# Patient Record
Sex: Female | Born: 1955 | Race: White | Hispanic: No | Smoking: Current every day smoker
Health system: Southern US, Community
[De-identification: ages and names within clinical notes are randomized; demographics above are authoritative.]

## PROBLEM LIST (undated history)

## (undated) DIAGNOSIS — F10939 Alcohol use, unspecified with withdrawal, unspecified: Secondary | ICD-10-CM

## (undated) DIAGNOSIS — R9431 Abnormal electrocardiogram [ECG] [EKG]: Secondary | ICD-10-CM

## (undated) DIAGNOSIS — K259 Gastric ulcer, unspecified as acute or chronic, without hemorrhage or perforation: Secondary | ICD-10-CM

## (undated) DIAGNOSIS — E079 Disorder of thyroid, unspecified: Secondary | ICD-10-CM

## (undated) DIAGNOSIS — K219 Gastro-esophageal reflux disease without esophagitis: Secondary | ICD-10-CM

## (undated) DIAGNOSIS — B1920 Unspecified viral hepatitis C without hepatic coma: Secondary | ICD-10-CM

## (undated) DIAGNOSIS — F419 Anxiety disorder, unspecified: Secondary | ICD-10-CM

## (undated) DIAGNOSIS — K7460 Unspecified cirrhosis of liver: Secondary | ICD-10-CM

## (undated) DIAGNOSIS — K8020 Calculus of gallbladder without cholecystitis without obstruction: Secondary | ICD-10-CM

## (undated) DIAGNOSIS — D691 Qualitative platelet defects: Secondary | ICD-10-CM

## (undated) DIAGNOSIS — K529 Noninfective gastroenteritis and colitis, unspecified: Secondary | ICD-10-CM

## (undated) DIAGNOSIS — R569 Unspecified convulsions: Secondary | ICD-10-CM

## (undated) HISTORY — DX: Unspecified cirrhosis of liver: K74.60

## (undated) HISTORY — PX: TONSILLECTOMY: SUR1361

## (undated) HISTORY — DX: Calculus of gallbladder without cholecystitis without obstruction: K80.20

## (undated) HISTORY — DX: Noninfective gastroenteritis and colitis, unspecified: K52.9

## (undated) HISTORY — PX: TUBAL LIGATION: SHX77

## (undated) HISTORY — PX: OTHER SURGICAL HISTORY: SHX169

## (undated) HISTORY — DX: Disorder of thyroid, unspecified: E07.9

## (undated) HISTORY — PX: DILATION AND CURETTAGE OF UTERUS: SHX78

## (undated) HISTORY — PX: CERVICAL DISCECTOMY: SHX98

## (undated) HISTORY — PX: SHOULDER ARTHROSCOPY WITH ROTATOR CUFF REPAIR: SHX5685

---

## 1998-07-13 ENCOUNTER — Other Ambulatory Visit: Admission: RE | Admit: 1998-07-13 | Discharge: 1998-07-13 | Payer: Self-pay | Admitting: Obstetrics and Gynecology

## 1998-08-10 ENCOUNTER — Other Ambulatory Visit: Admission: RE | Admit: 1998-08-10 | Discharge: 1998-08-10 | Payer: Self-pay | Admitting: Specialist

## 1998-11-09 ENCOUNTER — Ambulatory Visit (HOSPITAL_COMMUNITY): Admission: RE | Admit: 1998-11-09 | Discharge: 1998-11-09 | Payer: Self-pay | Admitting: Neurosurgery

## 1998-11-09 ENCOUNTER — Encounter: Payer: Self-pay | Admitting: Neurosurgery

## 1999-06-25 ENCOUNTER — Encounter: Admission: RE | Admit: 1999-06-25 | Discharge: 1999-06-25 | Payer: Self-pay | Admitting: *Deleted

## 1999-06-28 ENCOUNTER — Encounter: Admission: RE | Admit: 1999-06-28 | Discharge: 1999-06-28 | Payer: Self-pay | Admitting: *Deleted

## 1999-07-06 ENCOUNTER — Other Ambulatory Visit: Admission: RE | Admit: 1999-07-06 | Discharge: 1999-07-06 | Payer: Self-pay | Admitting: *Deleted

## 1999-11-12 ENCOUNTER — Encounter: Payer: Self-pay | Admitting: Neurosurgery

## 1999-11-12 ENCOUNTER — Ambulatory Visit (HOSPITAL_COMMUNITY): Admission: RE | Admit: 1999-11-12 | Discharge: 1999-11-12 | Payer: Self-pay | Admitting: Neurosurgery

## 2000-05-22 ENCOUNTER — Encounter (HOSPITAL_COMMUNITY): Admission: RE | Admit: 2000-05-22 | Discharge: 2000-08-20 | Payer: Self-pay | Admitting: Gastroenterology

## 2000-08-04 ENCOUNTER — Encounter: Admission: RE | Admit: 2000-08-04 | Discharge: 2000-08-04 | Payer: Self-pay | Admitting: *Deleted

## 2000-08-07 ENCOUNTER — Other Ambulatory Visit: Admission: RE | Admit: 2000-08-07 | Discharge: 2000-08-07 | Payer: Self-pay | Admitting: *Deleted

## 2000-11-10 ENCOUNTER — Ambulatory Visit (HOSPITAL_COMMUNITY): Admission: RE | Admit: 2000-11-10 | Discharge: 2000-11-10 | Payer: Self-pay | Admitting: Neurosurgery

## 2000-11-10 ENCOUNTER — Encounter: Payer: Self-pay | Admitting: Neurosurgery

## 2001-08-18 ENCOUNTER — Encounter: Admission: RE | Admit: 2001-08-18 | Discharge: 2001-08-18 | Payer: Self-pay | Admitting: *Deleted

## 2001-08-20 ENCOUNTER — Other Ambulatory Visit: Admission: RE | Admit: 2001-08-20 | Discharge: 2001-08-20 | Payer: Self-pay | Admitting: *Deleted

## 2001-10-12 ENCOUNTER — Ambulatory Visit (HOSPITAL_COMMUNITY): Admission: RE | Admit: 2001-10-12 | Discharge: 2001-10-12 | Payer: Self-pay | Admitting: *Deleted

## 2001-12-31 ENCOUNTER — Encounter: Admission: RE | Admit: 2001-12-31 | Discharge: 2001-12-31 | Payer: Self-pay | Admitting: Sports Medicine

## 2001-12-31 ENCOUNTER — Encounter: Payer: Self-pay | Admitting: Sports Medicine

## 2002-01-18 ENCOUNTER — Encounter: Payer: Self-pay | Admitting: Sports Medicine

## 2002-01-18 ENCOUNTER — Encounter: Admission: RE | Admit: 2002-01-18 | Discharge: 2002-01-18 | Payer: Self-pay | Admitting: Sports Medicine

## 2002-02-01 ENCOUNTER — Encounter: Payer: Self-pay | Admitting: Sports Medicine

## 2002-02-01 ENCOUNTER — Encounter: Admission: RE | Admit: 2002-02-01 | Discharge: 2002-02-01 | Payer: Self-pay | Admitting: Sports Medicine

## 2002-03-19 ENCOUNTER — Encounter: Payer: Self-pay | Admitting: Gastroenterology

## 2002-03-19 ENCOUNTER — Ambulatory Visit (HOSPITAL_COMMUNITY): Admission: RE | Admit: 2002-03-19 | Discharge: 2002-03-19 | Payer: Self-pay | Admitting: Gastroenterology

## 2002-03-19 ENCOUNTER — Encounter (INDEPENDENT_AMBULATORY_CARE_PROVIDER_SITE_OTHER): Payer: Self-pay | Admitting: Specialist

## 2002-04-30 ENCOUNTER — Encounter: Payer: Self-pay | Admitting: Neurosurgery

## 2002-05-04 ENCOUNTER — Inpatient Hospital Stay (HOSPITAL_COMMUNITY): Admission: RE | Admit: 2002-05-04 | Discharge: 2002-05-06 | Payer: Self-pay | Admitting: Neurosurgery

## 2002-05-04 ENCOUNTER — Encounter: Payer: Self-pay | Admitting: Neurosurgery

## 2002-08-19 ENCOUNTER — Encounter: Admission: RE | Admit: 2002-08-19 | Discharge: 2002-08-19 | Payer: Self-pay | Admitting: *Deleted

## 2002-09-07 ENCOUNTER — Other Ambulatory Visit: Admission: RE | Admit: 2002-09-07 | Discharge: 2002-09-07 | Payer: Self-pay | Admitting: *Deleted

## 2003-09-08 ENCOUNTER — Encounter: Admission: RE | Admit: 2003-09-08 | Discharge: 2003-09-08 | Payer: Self-pay | Admitting: *Deleted

## 2003-09-23 ENCOUNTER — Other Ambulatory Visit: Admission: RE | Admit: 2003-09-23 | Discharge: 2003-09-23 | Payer: Self-pay | Admitting: *Deleted

## 2004-09-25 ENCOUNTER — Encounter: Admission: RE | Admit: 2004-09-25 | Discharge: 2004-09-25 | Payer: Self-pay | Admitting: *Deleted

## 2005-10-09 ENCOUNTER — Encounter: Admission: RE | Admit: 2005-10-09 | Discharge: 2005-10-09 | Payer: Self-pay | Admitting: *Deleted

## 2006-09-26 ENCOUNTER — Emergency Department (HOSPITAL_COMMUNITY): Admission: EM | Admit: 2006-09-26 | Discharge: 2006-09-26 | Payer: Self-pay | Admitting: Emergency Medicine

## 2006-10-16 ENCOUNTER — Inpatient Hospital Stay (HOSPITAL_COMMUNITY): Admission: EM | Admit: 2006-10-16 | Discharge: 2006-10-19 | Payer: Self-pay | Admitting: Emergency Medicine

## 2007-06-01 ENCOUNTER — Emergency Department (HOSPITAL_COMMUNITY): Admission: EM | Admit: 2007-06-01 | Discharge: 2007-06-01 | Payer: Self-pay | Admitting: Emergency Medicine

## 2008-01-21 ENCOUNTER — Ambulatory Visit (HOSPITAL_BASED_OUTPATIENT_CLINIC_OR_DEPARTMENT_OTHER): Admission: RE | Admit: 2008-01-21 | Discharge: 2008-01-21 | Payer: Self-pay | Admitting: Orthopedic Surgery

## 2008-01-23 ENCOUNTER — Observation Stay (HOSPITAL_COMMUNITY): Admission: EM | Admit: 2008-01-23 | Discharge: 2008-01-24 | Payer: Self-pay | Admitting: Orthopedic Surgery

## 2008-08-19 ENCOUNTER — Emergency Department (HOSPITAL_COMMUNITY): Admission: EM | Admit: 2008-08-19 | Discharge: 2008-08-19 | Payer: Self-pay | Admitting: Emergency Medicine

## 2008-12-13 ENCOUNTER — Ambulatory Visit (HOSPITAL_COMMUNITY): Admission: RE | Admit: 2008-12-13 | Discharge: 2008-12-13 | Payer: Self-pay | Admitting: Family Medicine

## 2009-01-26 ENCOUNTER — Emergency Department (HOSPITAL_COMMUNITY): Admission: EM | Admit: 2009-01-26 | Discharge: 2009-01-28 | Payer: Self-pay | Admitting: Emergency Medicine

## 2009-07-25 ENCOUNTER — Emergency Department (HOSPITAL_COMMUNITY): Admission: EM | Admit: 2009-07-25 | Discharge: 2009-07-26 | Payer: Self-pay | Admitting: Emergency Medicine

## 2009-09-26 ENCOUNTER — Emergency Department (HOSPITAL_COMMUNITY): Admission: EM | Admit: 2009-09-26 | Discharge: 2009-09-27 | Payer: Self-pay | Admitting: Emergency Medicine

## 2009-10-13 ENCOUNTER — Ambulatory Visit (HOSPITAL_COMMUNITY): Admission: RE | Admit: 2009-10-13 | Discharge: 2009-10-13 | Payer: Self-pay | Admitting: Orthopedic Surgery

## 2010-06-28 ENCOUNTER — Emergency Department (HOSPITAL_COMMUNITY): Admission: EM | Admit: 2010-06-28 | Discharge: 2010-06-29 | Payer: Self-pay | Admitting: Emergency Medicine

## 2010-11-04 ENCOUNTER — Emergency Department (HOSPITAL_COMMUNITY)
Admission: EM | Admit: 2010-11-04 | Discharge: 2010-11-05 | Disposition: A | Discharge status: Other Acute Inpatient Hospital | Payer: Self-pay | Attending: Emergency Medicine | Admitting: Emergency Medicine

## 2010-11-04 DIAGNOSIS — I10 Essential (primary) hypertension: Secondary | ICD-10-CM | POA: Insufficient documentation

## 2010-11-04 DIAGNOSIS — F3289 Other specified depressive episodes: Secondary | ICD-10-CM | POA: Insufficient documentation

## 2010-11-04 DIAGNOSIS — G40909 Epilepsy, unspecified, not intractable, without status epilepticus: Secondary | ICD-10-CM | POA: Insufficient documentation

## 2010-11-04 DIAGNOSIS — F1010 Alcohol abuse, uncomplicated: Secondary | ICD-10-CM | POA: Insufficient documentation

## 2010-11-04 DIAGNOSIS — Z8619 Personal history of other infectious and parasitic diseases: Secondary | ICD-10-CM | POA: Insufficient documentation

## 2010-11-04 DIAGNOSIS — F329 Major depressive disorder, single episode, unspecified: Secondary | ICD-10-CM | POA: Insufficient documentation

## 2010-11-04 LAB — HEPATIC FUNCTION PANEL
Albumin: 3.5 g/dL (ref 3.5–5.2)
Alkaline Phosphatase: 74 U/L (ref 39–117)
Indirect Bilirubin: 1 mg/dL — ABNORMAL HIGH (ref 0.3–0.9)
Total Protein: 6.5 g/dL (ref 6.0–8.3)

## 2010-11-04 LAB — URINALYSIS, ROUTINE W REFLEX MICROSCOPIC
Glucose, UA: NEGATIVE mg/dL
Hgb urine dipstick: NEGATIVE
Ketones, ur: 40 mg/dL — AB
Protein, ur: NEGATIVE mg/dL
pH: 6 (ref 5.0–8.0)

## 2010-11-04 LAB — URINE MICROSCOPIC-ADD ON

## 2010-11-04 LAB — POCT I-STAT, CHEM 8
Calcium, Ion: 0.99 mmol/L — ABNORMAL LOW (ref 1.12–1.32)
Chloride: 96 mEq/L (ref 96–112)
Glucose, Bld: 105 mg/dL — ABNORMAL HIGH (ref 70–99)
HCT: 49 % — ABNORMAL HIGH (ref 36.0–46.0)
Hemoglobin: 16.7 g/dL — ABNORMAL HIGH (ref 12.0–15.0)
Potassium: 3.9 mEq/L (ref 3.5–5.1)

## 2010-11-04 LAB — RAPID URINE DRUG SCREEN, HOSP PERFORMED
Amphetamines: NOT DETECTED
Opiates: NOT DETECTED
Tetrahydrocannabinol: POSITIVE — AB

## 2010-11-04 LAB — DIFFERENTIAL
Eosinophils Absolute: 0.1 10*3/uL (ref 0.0–0.7)
Eosinophils Relative: 2 % (ref 0–5)
Lymphs Abs: 1.6 10*3/uL (ref 0.7–4.0)
Monocytes Relative: 7 % (ref 3–12)

## 2010-11-04 LAB — CBC
MCH: 32.1 pg (ref 26.0–34.0)
Platelets: 126 10*3/uL — ABNORMAL LOW (ref 150–400)
RBC: 5.01 MIL/uL (ref 3.87–5.11)

## 2010-11-05 ENCOUNTER — Inpatient Hospital Stay (HOSPITAL_COMMUNITY)
Admission: AD | Admit: 2010-11-05 | Discharge: 2010-11-09 | DRG: 897 | Disposition: A | Discharge status: Home / Self Care | Payer: PRIVATE HEALTH INSURANCE | Source: Ambulatory Visit | Attending: Psychiatry | Admitting: Psychiatry

## 2010-11-05 DIAGNOSIS — F1020 Alcohol dependence, uncomplicated: Principal | ICD-10-CM

## 2010-11-05 DIAGNOSIS — Z560 Unemployment, unspecified: Secondary | ICD-10-CM

## 2010-11-05 DIAGNOSIS — R45851 Suicidal ideations: Secondary | ICD-10-CM

## 2010-11-05 DIAGNOSIS — F3289 Other specified depressive episodes: Secondary | ICD-10-CM

## 2010-11-05 DIAGNOSIS — F329 Major depressive disorder, single episode, unspecified: Secondary | ICD-10-CM

## 2010-11-05 DIAGNOSIS — G40909 Epilepsy, unspecified, not intractable, without status epilepticus: Secondary | ICD-10-CM

## 2010-11-05 DIAGNOSIS — K219 Gastro-esophageal reflux disease without esophagitis: Secondary | ICD-10-CM

## 2010-11-06 DIAGNOSIS — F1020 Alcohol dependence, uncomplicated: Secondary | ICD-10-CM

## 2010-11-07 LAB — TSH: TSH: 15.727 u[IU]/mL — ABNORMAL HIGH (ref 0.350–4.500)

## 2010-11-07 LAB — COMPREHENSIVE METABOLIC PANEL
Albumin: 3.9 g/dL (ref 3.5–5.2)
Alkaline Phosphatase: 80 U/L (ref 39–117)
BUN: 8 mg/dL (ref 6–23)
Chloride: 99 mEq/L (ref 96–112)
GFR calc Af Amer: >60 mL/min (ref >60)
Glucose, Bld: 102 mg/dL — ABNORMAL HIGH (ref 70–99)
Potassium: 3.9 mEq/L (ref 3.5–5.1)
Total Bilirubin: 0.9 mg/dL (ref 0.3–1.2)

## 2010-11-07 LAB — VITAMIN B12: Vitamin B-12: 479 pg/mL (ref 211–911)

## 2010-11-14 LAB — URINALYSIS, ROUTINE W REFLEX MICROSCOPIC
Glucose, UA: 100 mg/dL — AB
Ketones, ur: 15 mg/dL — AB
Nitrite: NEGATIVE
pH: 7 (ref 5.0–8.0)

## 2010-11-14 LAB — DIFFERENTIAL
Eosinophils Absolute: 0 10*3/uL (ref 0.0–0.7)
Lymphocytes Relative: 20 % (ref 12–46)
Lymphs Abs: 1 10*3/uL (ref 0.7–4.0)
Monocytes Relative: 4 % (ref 3–12)
Neutro Abs: 3.9 10*3/uL (ref 1.7–7.7)
Neutrophils Relative %: 76 % (ref 43–77)

## 2010-11-14 LAB — LIPASE, BLOOD: Lipase: 27 U/L (ref 11–59)

## 2010-11-14 LAB — URINE MICROSCOPIC-ADD ON

## 2010-11-14 LAB — COMPREHENSIVE METABOLIC PANEL
ALT: 60 U/L — ABNORMAL HIGH (ref 0–35)
BUN: 8 mg/dL (ref 6–23)
CO2: 24 mEq/L (ref 19–32)
Calcium: 9.7 mg/dL (ref 8.4–10.5)
Creatinine, Ser: 0.76 mg/dL (ref 0.4–1.2)
GFR calc non Af Amer: >60 mL/min (ref >60)
Glucose, Bld: 176 mg/dL — ABNORMAL HIGH (ref 70–99)
Total Protein: 8.1 g/dL (ref 6.0–8.3)

## 2010-11-14 LAB — CBC
HCT: 50.8 % — ABNORMAL HIGH (ref 36.0–46.0)
Hemoglobin: 18.3 g/dL — ABNORMAL HIGH (ref 12.0–15.0)
MCH: 32.9 pg (ref 26.0–34.0)
MCHC: 36 g/dL (ref 30.0–36.0)
MCV: 91.2 fL (ref 78.0–100.0)
RDW: 12.5 % (ref 11.5–15.5)

## 2010-11-14 LAB — ETHANOL: Alcohol, Ethyl (B): <5 mg/dL (ref 0–10)

## 2010-11-14 NOTE — H&P (Signed)
  NAMEMarland Kitchen  KEYSI, OELKERS                  ACCOUNT NO.:  [id number]  MEDICAL RECORD NO.:  [id number]           PATIENT TYPE:  I  LOCATION:  0300                          FACILITY:  [facility]  PHYSICIAN:  Anselm Jungling, MD  DATE OF BIRTH:  07/09/1956  DATE OF ADMISSION:  11/05/2010 DATE OF DISCHARGE:  11/09/2010                      PSYCHIATRIC ADMISSION ASSESSMENT   HISTORY OF PRESENT ILLNESS:  This is a 55 year old who came with a long history of alcohol use and having a history of seizures.  She presented to the emergency room with severe depression and alcohol dependence, having suicidal thinking with several plans.  PAST PSYCHIATRIC HISTORY:  First admission to the [facility].  The patient currently on Celexa.  SOCIAL HISTORY:  This is a single, unemployed female who lives in [place].  FAMILY HISTORY:  Family history is unknown.  ALLERGIES:  ADHESIVES, SULFA WITH THE REACTION AS HIVES, OXYCODONE - HIVES.  CODEINE - HIVES.  ALCOHOL/DRUG HISTORY:  Alcohol history:  The patient has been drinking approximately a pint daily for the past 9 months.  PRIMARY CARE PROVIDER:  [facility].  MEDICAL PROBLEMS:  History of seizures and GERD.  PHYSICAL EXAMINATION:  Physical exam was done in [facility] Emergency Department.  This is a middle-aged female in no distress.  LABORATORY DATA:  Alcohol level less than five.  Urine drug screen positive for benzodiazepines.  Urinalysis shows a cloudy appearance. Sodium 132, glucose 105, hemoglobin 16.1, hematocrit 46.7, platelet count 126,000.  Liver enzymes are elevated with SGOT of 99, SGPT of 70, total bilirubin of 1.5, and direct bilirubin of 0.5.  MENTAL STATUS EXAM:  A pleasant and cooperative female in no acute distress, coherent thought processes, feeling depressed, and does not appear to be actively responding.  DIAGNOSES:  Axis I:  Alcohol dependence. Axis II:  Deferred. Axis III:  Seizure  disorder, gastroesophageal reflux disease. Axis IV:  Problems with unemployment, problems with primary support - son. Axis V:  Current is 40.  PLAN:  Our plan is to detoxify the patient with Librium protocol, monitor further labs, and assess motivation for rehab.  We will continue with her Celexa.     Landry Corporal, N.P.   ______________________________ Anselm Jungling, MD    JO/MEDQ  D:  11/13/2010  T:  11/13/2010  Job:  [id number]  Electronically Signed by Limmie PatriciaP. on 11/13/2010 04:12:32 PM Electronically Signed by Geralyn Flash MD on 11/14/2010 11:29:35 AM

## 2010-11-14 NOTE — Discharge Summary (Signed)
NAMEMarland Kitchen  Susan Francis, Susan Francis                  ACCOUNT NO.:  [id number]  MEDICAL RECORD NO.:  [id number]           PATIENT TYPE:  I  LOCATION:  0300                          FACILITY:  [facility]  PHYSICIAN:  Anselm Jungling, MD  DATE OF BIRTH:  12-Mar-1956  DATE OF ADMISSION:  11/05/2010 DATE OF DISCHARGE:                              DISCHARGE SUMMARY   IDENTIFYING DATA/REASON FOR ADMISSION:  This was an inpatient psychiatric admission for Susan Francis, a 55 year old woman who was admitted for treatment of alcohol dependence.  Please refer to the admission note for further details pertaining to the symptoms, circumstances and history that led to her hospitalization.  She was given an initial Axis I diagnosis of alcohol dependence.  MEDICAL AND LABORATORY:  The patient has a history of seizure disorder. She is followed at [facility].  Her normal regimen for this is Keppra XR 750 mg t.i.d.  This was continued during her stay. She was medically and physically assessed by the psychiatric nurse practitioner.  There were no significant medical issues during her stay. At discharge she was referred back to [facility] for follow-up.  HOSPITAL COURSE:  The patient was admitted to the adult inpatient psychiatric service.  She presented as a very pleasant, articulate and friendly woman who readily acknowledged that she needed to stop using alcohol.  She described having become more dependent on alcohol as a means of treating her depression, which she recognized was maladaptive. She had been on a regimen of Celexa 60 mg, but the dose was decreased to 40 mg out of consideration for possibility of QT prolongation.  She was placed on a Librium withdrawal protocol and continued on her usual Keppra 750 mg 3 times daily.  Her detoxification was uneventful. She participated in therapeutic groups and activities geared towards helping her acquire better coping skills, a better  understanding of her underlying disorders and dynamics, and the development of a sound aftercare plan.  She also attended groups geared towards 12-step recovery.  She was an excellent participant in treatment program throughout.  She did not have any suicidal ideation at any time during her stay.  She verbalized understanding of the need to permanently and forever relinquish alcohol.  She was appropriate for discharge on the fifth hospital day.  She had completed her detoxification, without any seizure activity.  She agreed to the following aftercare plan.  AFTERCARE:  The patient was to follow-up at [facility], where, at the time of this dictation, appointments were still yet to be made for a psychiatrist for medication follow-up, and individual counseling.  She was also encouraged to attend 12-step groups in the community.  She was referred back to [facility] for follow-up of her seizure disorder.  DISCHARGE MEDICATIONS: 1. Keppra XR 750 mg b.i.d. 2. Celexa 40 mg daily.  DISCHARGE DIAGNOSES:  AXIS I: Alcohol dependence, early remission and depressive disorder NOS. AXIS II: Deferred. AXIS III: Seizure disorder NOS. AXIS IV: Stressors severe. AXIS V: GAF on discharge 50.     Anselm Jungling, MD     SPB/MEDQ  D:  11/09/2010  T:  11/09/2010  Job:  [id number]  Electronically Signed by Geralyn Flash MD on 11/14/2010 11:29:32 AM

## 2010-11-19 LAB — CBC
HCT: 44.5 % (ref 36.0–46.0)
MCHC: 34.3 g/dL (ref 30.0–36.0)
MCV: 97.1 fL (ref 78.0–100.0)
RBC: 4.58 MIL/uL (ref 3.87–5.11)
WBC: 8.4 10*3/uL (ref 4.0–10.5)

## 2010-11-19 LAB — POCT I-STAT, CHEM 8
Calcium, Ion: 1.04 mmol/L — ABNORMAL LOW (ref 1.12–1.32)
Creatinine, Ser: 0.7 mg/dL (ref 0.4–1.2)
Glucose, Bld: 145 mg/dL — ABNORMAL HIGH (ref 70–99)
Hemoglobin: 16 g/dL — ABNORMAL HIGH (ref 12.0–15.0)
TCO2: 22 mmol/L (ref 0–100)

## 2010-11-19 LAB — DIFFERENTIAL
Basophils Relative: 1 % (ref 0–1)
Eosinophils Absolute: 0 10*3/uL (ref 0.0–0.7)
Eosinophils Relative: 0 % (ref 0–5)
Lymphs Abs: 2 10*3/uL (ref 0.7–4.0)
Monocytes Relative: 3 % (ref 3–12)

## 2010-12-11 LAB — RAPID URINE DRUG SCREEN, HOSP PERFORMED
Amphetamines: NOT DETECTED
Barbiturates: NOT DETECTED
Benzodiazepines: POSITIVE — AB
Cocaine: NOT DETECTED
Opiates: NOT DETECTED

## 2010-12-11 LAB — BASIC METABOLIC PANEL
CO2: 27 mEq/L (ref 19–32)
Calcium: 8.9 mg/dL (ref 8.4–10.5)
Glucose, Bld: 107 mg/dL — ABNORMAL HIGH (ref 70–99)
Sodium: 137 mEq/L (ref 135–145)

## 2010-12-11 LAB — URINALYSIS, ROUTINE W REFLEX MICROSCOPIC
Glucose, UA: NEGATIVE mg/dL
Hgb urine dipstick: NEGATIVE
Ketones, ur: NEGATIVE mg/dL
Protein, ur: NEGATIVE mg/dL

## 2010-12-11 LAB — DIFFERENTIAL
Basophils Absolute: 0 10*3/uL (ref 0.0–0.1)
Basophils Relative: 1 % (ref 0–1)
Eosinophils Relative: 2 % (ref 0–5)
Monocytes Absolute: 0.4 10*3/uL (ref 0.1–1.0)
Neutro Abs: 3.1 10*3/uL (ref 1.7–7.7)

## 2010-12-11 LAB — URINE MICROSCOPIC-ADD ON

## 2010-12-11 LAB — CBC
Hemoglobin: 15.3 g/dL — ABNORMAL HIGH (ref 12.0–15.0)
MCHC: 34.1 g/dL (ref 30.0–36.0)
RDW: 13.5 % (ref 11.5–15.5)

## 2011-01-15 NOTE — Consult Note (Signed)
NAME:  Susan Francis, Susan Francis NO.:  [id number]   MEDICAL RECORD NO.:  [id number]          PATIENT TYPE:  EMS   LOCATION:  ED                           FACILITY:  [facility]   PHYSICIAN:  Antonietta Breach, M.D.  DATE OF BIRTH:  01/28/1956   DATE OF CONSULTATION:  01/27/2009  DATE OF DISCHARGE:                                 CONSULTATION   REFERRING PHYSICIAN:  [facility].   HISTORY OF PRESENT ILLNESS:  Susan Francis is a 55 year old female presenting  to [facility] with a request for detoxification.   She has been drinking a pint of liquor per day for a week-and-a-half.  She takes Xanax 0.5 mg daily.  She does have a primary seizure disorder.   HISTORY:  She received a milligram of Ativan last night with her vital  signs at temperature 90.2, pulse 60, respiratory rate 20, blood pressure  143/86 this morning.  VITAL SIGNS:  Temperature 98.2, pulse 95, respiratory rate 20, blood  pressure 157/75.  Please see the physical examination section.   Her mood state is normal.  She has constructive future goals and  interests.  She does not have any thoughts of harming herself or others.  She has no hallucinations or delusions.  Her orientation and memory  function are intact.  She has an excellent supportive relationship with  her daughter, and her daughter has been providing support today.   She is socially appropriate and cooperative with the emergency room  staff. When she first presented she was having some high jitteriness and  anxiety.  Please see the initial vital signs above.  She, however, is  not showing any excessive worry.   PAST PSYCHIATRIC HISTORY:  She does have a history of depression, and  has been taking her Celexa at home regularly.  She does have a history  of anxiety, and takes Xanax 0.5 mg daily.  Her Celexa dosage is 40 mg  daily.   FAMILY PSYCHIATRIC HISTORY:  None known.   SOCIAL HISTORY:  Please see the substance abuse history above.   She  lives at home.  She is separated.  She attends church.   PAST MEDICAL HISTORY:  1. Gastroesophageal reflux disease.  2. Hepatitis C.  3. Seizure disorder on Keppra.   ALLERGIES:  1. CODEINE.  2. SULFA.   CURRENT MEDICATIONS:  1. Ativan 1 mg q.4 hours p.r.n. withdrawal.  She is receiving vital      signs q.4 hours.  2. Celexa 40 mg daily.  3. Keppra 75 mg b.i.d.   LABORATORY DATA:  Her BMP is normal.  Alcohol level negative.  The urine  drug screen was positive for benzodiazepines and tetrahydrocannabinol.  She has an unremarkable CBC.   Urine pregnancy negative.   REVIEW OF SYSTEMS:  Noncontributory.   PHYSICAL EXAMINATION:  Please see the vital signs above.  She has slight  tremor on hand extension.  There are no sweats.   MENTAL STATUS EXAM:  Eye contact is good.  Her attention span is normal.  Her affect is slightly anxious, mood is slightly  anxious.  She is  oriented to all spheres.  Memory is intact to immediate recent and  remote.  Fund of knowledge and intelligence are normal.  Speech is  normal.  Thought process logical, coherent, goal-directed.  No looseness  of associations.  Thought content:  No thoughts of harming herself or  others, no delusions or hallucinations.  Insight is intact.  Judgment is  intact.   ASSESSMENT:  Axis I:  Rule out a combination of sedative hypnotic  dependence and alcohol dependence with physiologic dependence.  Polysubstance dependence.  Anxiety disorder not otherwise specified,  stable.  Depressive disorder not otherwise specified, stable.  Axis II:  Deferred.  Axis III:  See the history above.  She does have a primary seizure  disorder.  Axis IV:  Primary support group.  Axis V:  55.   Susan Francis is not at risk to harm herself or others.  She agrees to use  emergency services as needed.   The undersigned provided education.   The undersigned discussed the case with the attending emergency room  physician.   The  recommendation at this point is to continue to observe Susan Francis for  any signs of withdrawal, and have a p.r.n. Ativan regimen available.  If  she does present withdrawal, would start the phenobarbital withdrawal  protocol and discontinue Ativan.   Would continue her Celexa 40 mg daily for anti-anxiety, anti-depression.   When she is finished with her evaluation and treatment of potential  withdrawal, she would be a candidate for a residential chemical  dependence rehabilitation program such as [facility], [id number] or [employer].   Twelve-step method and groups.   Thiamine 100 mg daily indefinitely.      Antonietta Breach, M.D.  Electronically Signed     JW/MEDQ  D:  01/27/2009  T:  01/27/2009  Job:  [id number]   cc:   [facility]
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
## 2011-01-18 NOTE — H&P (Signed)
   NAME:  Susan Francis, Susan Francis                            ACCOUNT NO.:  [id number]   MEDICAL RECORD NO.:  [id number]                   PATIENT TYPE:  INP   LOCATION:  3011                                 FACILITY:  [facility]   PHYSICIAN:  Payton Doughty, M.D.                   DATE OF BIRTH:  Sep 19, 1955   DATE OF ADMISSION:  05/04/2002  DATE OF DISCHARGE:                                HISTORY & PHYSICAL   ADMISSION DIAGNOSIS:  Herniated disk, C5-6 and C6-7.   HISTORY OF PRESENT ILLNESS:  This is a 55 year old, right-handed, white lady  known for years.  She has a Rathke's cleft cyst.  She was doing well from  that standpoint and then developed pain in her neck and out into her right  shoulder.  MRI demonstrated herniated disk at C5-6 and degenerative changes  at C6-7.  She underwent epidural steroids to no avail and now has presented  for an anterior cervicectomy and fusion at C5-6 and C6-7.   PAST MEDICAL HISTORY:  The medical history is otherwise unremarkable.   PAST SURGICAL HISTORY:  She has had a tubal ligation in the past.   ALLERGIES:  None.   FAMILY HISTORY:  Unknown.   REVIEW OF SYMPTOMS:  Remarkable for neck and shoulder pain.   PHYSICAL EXAMINATION:  HEENT:  Exam is within normal limits.  NECK:  She has good range of motion.  CHEST:  Clear.  CARDIAC:  Regular rate and rhythm.  ABDOMEN:  Nontender.  No hepatosplenomegaly.  EXTREMITIES:  Without clubbing or cyanosis.  GENITOURINARY:  Exam is deferred.  PERIPHERAL PULSES:  Good.  NEUROLOGIC:  She is awake, alert, and oriented.  Cranial nerves are intact.  The motor exam demonstrates 5/5 strength throughout the upper and lower  extremities, except for the right triceps 4/5 and the biceps 5-/5 on the  right.  Reflexes are diminished in the right biceps and triceps.  Head  motion does not have a Lhermitte's sign, just increased shoulder pain.   LABORATORY DATA:  MRI demonstrated herniated disk at C5-6 and C6-7.   PLAN:  Anterior  cervicectomy and fusion at C5-6 and C6-7.  The risks and  benefits of this approach have been discussed with her and she wishes to  proceed.                                               Payton Doughty, M.D.    MWR/MEDQ  D:  05/04/2002  T:  05/04/2002  Job:  [id number]

## 2011-01-18 NOTE — Procedures (Signed)
EEG NUMBER:  [id number].   CLINICAL HISTORY:  The patient is a 55 year old who has had a series of  3 generalized seizures.  Study is being done to look for the presence of  an ictal focus (Code 780.39).   PROCEDURE:  The tracing is carried out on a 32-digital Cadwell recorder  reformatted into 16-channel montages with one devoted to EKG.  The  patient was awake and drowsy during the recording.  The [employer]  10/20 system of lead placement was used.   MEDICATIONS INCLUDE:  Ativan, morphine sulfate, Protonix, Dilantin,  Cerebyx, Tylenol, and Phenergan.   DESCRIPTION OF FINDINGS:  Dominant frequency is a 20-40 microvolt 11-12  Hz activity that is broadly distributed.  Frontally predominant, under  10 microvolt, beta range activity was seen.  There was no full focal  slowing.  There was no interictal epileptiform activity in the form of  spikes or sharp waves.  EKG showed a regular sinus rhythm with  ventricular response of 7-8 beats per minute.   IMPRESSION:  Normal waking record.      Deanna Artis. Sharene Skeans, M.D.  Electronically Signed     ZOX:WRUE  D:  10/17/2006 23:26:48  T:  10/18/2006 08:04:54  Job #:  [id number]
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
## 2011-01-18 NOTE — Op Note (Signed)
NAME:  Susan Francis, Susan Francis                            ACCOUNT NO.:  [id number]   MEDICAL RECORD NO.:  [id number]                   PATIENT TYPE:  INP   LOCATION:  3011                                 FACILITY:  [facility]   PHYSICIAN:  Payton Doughty, M.D.                   DATE OF BIRTH:  18-Jan-1956   DATE OF PROCEDURE:  05/04/2002  DATE OF DISCHARGE:                                 OPERATIVE REPORT   PREOPERATIVE DIAGNOSES:  Herniated disk at C5-6, cervical spondylosis at C6-  7.   POSTOPERATIVE DIAGNOSES:  Herniated disk at C5-6, cervical spondylosis at C6-  7.   OPERATION PERFORMED:  C5-6, C6-7 anterior cervical diskectomy with tether  plate.   SURGEON:  Payton Doughty, M.D.   ANESTHESIA:  General endotracheal.   PREP:  Sterile Betadine prep and scrub with alcohol wipe.   ASSISTANT:  1. Clydene Fake, M.D.  2. McCall.   INDICATIONS FOR PROCEDURE:  The patient is a 55 year old right-handed white  female with a right C6 and 7 radiculopathy.   DESCRIPTION OF PROCEDURE:  The patient was taken to the operating room,  smoothly anesthetized and placed  supine on the operating table.  Following  shave, prep and drape, in the usual sterile fashion, the skin was incised  from the midline to the border of the sternocleidomastoid muscle on the left  side two fingerbreadths below the level of the carotid tubercle.  The  platysma was identified, elevated, divided and undermined.  The  sternocleidomastoid was identified.  Medial dissection revealed the carotid  artery retracted laterally to the left.  Trachea and esophagus were  retracted laterally to the right exposing the bones of the anterior cervical  spine.  A marker was placed and intraoperative x-ray obtained to confirm  correctness of the level.  Having confirmed correctness of the level, the  longus colli was taken down bilaterally and held out of place with a Shadow  line self-retaining retractor.  Intraoperative x-ray confirmed  the  correctness of the level.  Diskectomy was carried out at the C5-6 and C6-7  first under gross observation.  The operating microscope was then brought in  and we used microdissection technique to dissect the anterior epidural  space, remove disk and bone spur and liberate the 6 and 7 roots bilaterally.  At 5-6 there was a fairly large herniated disk extending toward the right  neural foramen.  This was removed without difficulty.  At 6-7 there was  mostly spondylitic disease.  Following complete decompression of the nerve  roots, the wound was irrigated and hemostasis assured.  7 mm bone graft was  fashioned with patellar allograft and tapped into place.  A 39 mm tether  plate was then placed with 12 mm screws, two in C5, one in C6 and two in C7.  No second intraoperative x-ray was obtained as the  patient's body habitus  prevented seeing any of the construct.  The wound was irrigated.  A 7 mm  Hemovac drain was placed in the posterior cervical space.  It was exited by  separate incision.  The platysma was reapproximated with 3-0 Vicryl in  interrupted fashion.  Subcutaneous tissues were reapproximated with 3-0  Vicryl in interrupted fashion.  The skin was closed with 4-0 Vicryl in  running subcuticular fashion.  Benzoin and Steri-Strips were placed and made  occlusive with Telfa and OpSite and the patient returned to the recovery  room in good condition.                                                 Payton Doughty, M.D.    MWR/MEDQ  D:  05/04/2002  T:  05/04/2002  Job:  [id number]
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
## 2011-01-18 NOTE — Discharge Summary (Signed)
NAMEMarland Kitchen  Susan Francis, Susan Francis                  ACCOUNT NO.:  [id number]   MEDICAL RECORD NO.:  [id number]          PATIENT TYPE:  INP   LOCATION:  2623                         [facility]ACILITY[facility]  [facility]   PHYSICIAN:  Barnetta Chapel, MDDATE OF BIRTH:  [birth year]   DATE OF ADMISSION:  10/16/2006  DATE OF DISCHARGE:  10/19/2006                               DISCHARGE SUMMARY   ADDENDUM:  The patient is going to be discharged back home today, October 19, 2006.  On the day of discharge, the patient reported redness around the  left elbow (around the IV site).  The patient's daughter is a bit  worried that may be associated with cellulitis.  I have prescribed  Levaquin 750 mg p.o. once daily for 5 days.   ADDENDUM TO DISCHARGE DIAGNOSIS:  Please add to the discharge diagnoses:  Suspect cellulitis left anterior cubital fossa (elbow area).      Barnetta Chapel, MD  Electronically Signed     SIO/MEDQ  D:  10/19/2006  T:  10/19/2006  Job:  [id number]   cc:   Ladell Pier, M.D.

## 2011-05-29 LAB — CBC
MCHC: 34.2
RDW: 13.9

## 2011-05-29 LAB — BASIC METABOLIC PANEL
BUN: 4 — ABNORMAL LOW
CO2: 29
Calcium: 8.3 — ABNORMAL LOW
Creatinine, Ser: 0.74
GFR calc Af Amer: >60
Glucose, Bld: 85

## 2011-06-13 LAB — URINALYSIS, ROUTINE W REFLEX MICROSCOPIC
Leukocytes, UA: NEGATIVE
Protein, ur: 100 — AB
Urobilinogen, UA: 0.2

## 2011-06-13 LAB — RAPID URINE DRUG SCREEN, HOSP PERFORMED
Amphetamines: NOT DETECTED
Benzodiazepines: NOT DETECTED
Opiates: NOT DETECTED
Tetrahydrocannabinol: POSITIVE — AB

## 2011-06-13 LAB — URINE MICROSCOPIC-ADD ON

## 2011-06-13 LAB — BASIC METABOLIC PANEL
BUN: 3 — ABNORMAL LOW
Calcium: 9.4
GFR calc non Af Amer: >60
Potassium: 3.5
Sodium: 142

## 2011-10-21 ENCOUNTER — Emergency Department (HOSPITAL_COMMUNITY): Payer: Self-pay

## 2011-10-21 ENCOUNTER — Encounter (HOSPITAL_COMMUNITY): Payer: Self-pay | Admitting: Emergency Medicine

## 2011-10-21 ENCOUNTER — Emergency Department (HOSPITAL_COMMUNITY)
Admission: EM | Admit: 2011-10-21 | Discharge: 2011-10-21 | Disposition: A | Discharge status: Home Health | Payer: Self-pay | Attending: Emergency Medicine | Admitting: Emergency Medicine

## 2011-10-21 DIAGNOSIS — R1110 Vomiting, unspecified: Secondary | ICD-10-CM | POA: Insufficient documentation

## 2011-10-21 DIAGNOSIS — B1920 Unspecified viral hepatitis C without hepatic coma: Secondary | ICD-10-CM | POA: Insufficient documentation

## 2011-10-21 DIAGNOSIS — F411 Generalized anxiety disorder: Secondary | ICD-10-CM | POA: Insufficient documentation

## 2011-10-21 DIAGNOSIS — E876 Hypokalemia: Secondary | ICD-10-CM | POA: Insufficient documentation

## 2011-10-21 DIAGNOSIS — F17200 Nicotine dependence, unspecified, uncomplicated: Secondary | ICD-10-CM | POA: Insufficient documentation

## 2011-10-21 DIAGNOSIS — F419 Anxiety disorder, unspecified: Secondary | ICD-10-CM

## 2011-10-21 DIAGNOSIS — R197 Diarrhea, unspecified: Secondary | ICD-10-CM | POA: Insufficient documentation

## 2011-10-21 DIAGNOSIS — K529 Noninfective gastroenteritis and colitis, unspecified: Secondary | ICD-10-CM

## 2011-10-21 DIAGNOSIS — Z79899 Other long term (current) drug therapy: Secondary | ICD-10-CM | POA: Insufficient documentation

## 2011-10-21 DIAGNOSIS — Z8619 Personal history of other infectious and parasitic diseases: Secondary | ICD-10-CM | POA: Insufficient documentation

## 2011-10-21 DIAGNOSIS — G40909 Epilepsy, unspecified, not intractable, without status epilepticus: Secondary | ICD-10-CM | POA: Insufficient documentation

## 2011-10-21 DIAGNOSIS — K5289 Other specified noninfective gastroenteritis and colitis: Secondary | ICD-10-CM | POA: Insufficient documentation

## 2011-10-21 HISTORY — DX: Unspecified convulsions: R56.9

## 2011-10-21 HISTORY — DX: Unspecified viral hepatitis C without hepatic coma: B19.20

## 2011-10-21 LAB — COMPREHENSIVE METABOLIC PANEL
AST: 115 U/L — ABNORMAL HIGH (ref 0–37)
Albumin: 4.1 g/dL (ref 3.5–5.2)
BUN: 6 mg/dL (ref 6–23)
Calcium: 10.1 mg/dL (ref 8.4–10.5)
Creatinine, Ser: 0.57 mg/dL (ref 0.50–1.10)
Total Protein: 7.8 g/dL (ref 6.0–8.3)

## 2011-10-21 LAB — CBC
MCH: 31.9 pg (ref 26.0–34.0)
Platelets: 151 10*3/uL (ref 150–400)
RBC: 5.51 MIL/uL — ABNORMAL HIGH (ref 3.87–5.11)
RDW: 13.3 % (ref 11.5–15.5)

## 2011-10-21 LAB — URINE MICROSCOPIC-ADD ON

## 2011-10-21 LAB — ETHANOL: Alcohol, Ethyl (B): <11 mg/dL (ref 0–11)

## 2011-10-21 LAB — DIFFERENTIAL
Basophils Absolute: 0 10*3/uL (ref 0.0–0.1)
Basophils Relative: 0 % (ref 0–1)
Eosinophils Absolute: 0 10*3/uL (ref 0.0–0.7)
Lymphs Abs: 1.3 10*3/uL (ref 0.7–4.0)
Neutrophils Relative %: 76 % (ref 43–77)

## 2011-10-21 LAB — URINALYSIS, ROUTINE W REFLEX MICROSCOPIC
Glucose, UA: NEGATIVE mg/dL
Leukocytes, UA: NEGATIVE
Nitrite: NEGATIVE
Protein, ur: 30 mg/dL — AB
Urobilinogen, UA: 1 mg/dL (ref 0.0–1.0)

## 2011-10-21 LAB — LIPASE, BLOOD: Lipase: 25 U/L (ref 11–59)

## 2011-10-21 MED ORDER — LORAZEPAM 2 MG/ML IJ SOLN
1.0000 mg | Freq: Once | INTRAMUSCULAR | Status: AC
  Administered 2011-10-21: 1 mg via INTRAVENOUS
  Filled 2011-10-21: qty 1

## 2011-10-21 MED ORDER — SODIUM CHLORIDE 0.9 % IV SOLN
1000.0000 mL | Freq: Once | INTRAVENOUS | Status: AC
  Administered 2011-10-21: 1000 mL via INTRAVENOUS

## 2011-10-21 MED ORDER — ONDANSETRON HCL 4 MG PO TABS: 8.0000 mg | ORAL_TABLET | Freq: Four times a day (QID) | ORAL | Status: AC | PRN

## 2011-10-21 MED ORDER — HYDROMORPHONE HCL PF 1 MG/ML IJ SOLN
0.5000 mg | Freq: Once | INTRAMUSCULAR | Status: AC
Start: 2011-10-21 — End: 2011-10-21
  Administered 2011-10-21: 0.5 mg via INTRAVENOUS
  Filled 2011-10-21: qty 1

## 2011-10-21 MED ORDER — LORAZEPAM 1 MG PO TABS: 1.0000 mg | ORAL_TABLET | Freq: Four times a day (QID) | ORAL | Status: AC | PRN

## 2011-10-21 MED ORDER — SODIUM CHLORIDE 0.9 % IV SOLN
1000.0000 mL | INTRAVENOUS | Status: DC
  Administered 2011-10-21: 1000 mL via INTRAVENOUS

## 2011-10-21 MED ORDER — POTASSIUM CHLORIDE CRYS ER 20 MEQ PO TBCR
40.0000 meq | EXTENDED_RELEASE_TABLET | Freq: Once | ORAL | Status: AC
  Administered 2011-10-21: 40 meq via ORAL
  Filled 2011-10-21: qty 2

## 2011-10-21 MED ORDER — ONDANSETRON HCL 4 MG/2ML IJ SOLN
4.0000 mg | Freq: Once | INTRAMUSCULAR | Status: AC
  Administered 2011-10-21: 4 mg via INTRAVENOUS
  Filled 2011-10-21: qty 2

## 2011-10-21 NOTE — ED Notes (Signed)
Pt laying in floor; pt sts laid in floor because does not want to sit; pt assisted to wheelchair

## 2011-10-21 NOTE — Discharge Instructions (Signed)
Stent a clear liquid diet for one to 2 days, then start a bland diet. See the Dr. of your choice for problems. RESOURCE GUIDE  Dental Problems  Patients with Medicaid: [facility] [phone] [address]                                           [phone] [address] Phone:  [phone]                                                  Phone:  [phone]  If unable to pay or uninsured, contact:  [facility] or [facility]. to become qualified for the adult dental clinic.  Chronic Pain Problems Contact [facility]  [phone] Patients need to be referred by their primary care doctor.  Insufficient Money for Medicine Contact [employer]:  call "211" or [facility] (320)[phone].  No Primary Care Doctor Call [facility]  [phone] Other agencies that provide inexpensive medical care    [facility]  [phone]    [facility]  [phone]    [facility]  [phone]    [facility]  [phone]    [facility]  [phone]    [facility]  [phone]  Psychological Services [facility]  [phone] [facility]  [phone] [facility]   [phone] (emergency services [phone])  Substance Abuse Resources [facility]  [phone] [facility] ([phone] [facility] [phone] Floyd[facility] Flock ([phone] Residential & Outpatient Substance Abuse Program  [phone]  Abuse/Neglect [facility] Child Abuse Hotline 984-496-4386 [facility] Child Abuse Hotline (770)082-0945 (After Hours)  Emergency Shelter [facility] (507)354-9926  Maternity Homes Room at the [place] [facility] [place] (814)[phone] [facility] [phone]  MRSA Hotline #:   320 116 4897    [facility] Resources  [facility] [place]     [employer]                           [facility] Dept. [address] [place]                       [address]      [address]  [place]                                                Cristobal Goldmann Phone:  [phone]                                   Phone:  [phone]  Phone:  [phone]  [facility] Phone:  [phone]  [facility] Child Abuse Hotline [phone] (After Hours) B.R.A.T. Diet Your doctor has recommended the B.R.A.T. diet for you or your child until the condition improves. This is often used to help control diarrhea and vomiting symptoms. If you or your child can tolerate clear liquids, you may have:  Bananas.   Rice.   Applesauce.   Toast (and other simple starches such as crackers, potatoes, noodles).  Be sure to avoid dairy products, meats, and fatty foods until symptoms are better. Fruit juices such as apple, grape, and prune juice can make diarrhea worse. Avoid these. Continue this diet for 2 days or as instructed by your caregiver. Document Released: 08/19/2005 Document Revised: 05/01/2011 Document Reviewed: 02/05/2007 [facility] Patient Information 2012 [place], [place].Foods Rich in Potassium Food / Potassium (mg)  Apricots, dried,  cup / 378 mg   Apricots, raw, 1 cup halves / 401 mg   Avocado,  / 487 mg   Banana, 1 large / 487 mg   Beef, lean, round, 3 oz / 202 mg   Cantaloupe, 1 cup cubes / 427 mg   Dates, medjool, 5 whole / 835 mg   Ham, cured, 3 oz / 212 mg   Lentils, dried,  cup / 458 mg   Lima beans, frozen,  cup / 258 mg   Orange, 1 large / 333 mg   Orange juice, 1 cup / 443 mg   Peaches, dried,  cup / 398 mg   Peas, split, cooked,  cup / 355 mg   Potato, boiled, 1 medium / 515 mg   Prunes, dried, uncooked,  cup / 318 mg   Raisins,  cup / 309 mg   Salmon, pink, raw, 3 oz / 275 mg   Sardines, canned , 3 oz / 338 mg   Tomato, raw, 1  medium / 292 mg   Tomato juice, 6 oz / 417 mg   [place], 3 oz / 349 mg  Document Released: 08/19/2005 Document Revised: 05/01/2011 Document Reviewed: 01/02/2009 [facility] Patient Information 2012 [place], [place].Clear Liquid Diet The clear liquid dietconsists of foods that are liquid or will become liquid at room temperature.You should be able to see through the liquid and beverages. Examples of foods allowed on a clear liquid diet include fruit juice, broth or bouillon, gelatin, or frozen ice pops. The purpose of this diet is to provide necessary fluid, electrolytes such a sodium and potassium, and energy to keep the body functioning during times when you are not able to consume a regular diet.A clear liquid diet should not be continued for long periods of time as it is not nutritionally adequate.  REASONS FOR USING A CLEAR LIQUID DIET  In sudden onset (acute) conditions for a patient before or after surgery.   As the first step in oral feeding.   For fluid and electrolyte replacement in diarrheal diseases.   As a diet before certain medical tests are performed.  ADEQUACY The clear liquid diet is adequate only in ascorbic acid, according to the Recommended Dietary Allowances of the [employer]. CHOOSING FOODS Breads and Starches  Allowed:  None are allowed.   Avoid: All are avoided.  Vegetables  Allowed:  Strained tomato or vegetable juice.   Avoid: Any others.  Fruit  Allowed:  Strained fruit juices and fruit drinks. Include 1 serving of citrus or vitamin C-enriched fruit juice daily.   Avoid: Any others.  Meat and  Meat Substitutes  Allowed:  None are allowed.   Avoid: All are avoided.  Milk  Allowed:  None are allowed.   Avoid: All are avoided.  Soups and Combination Foods  Allowed:  Clear bouillon, broth, or strained broth-based soups.   Avoid: Any others.  Desserts and Sweets  Allowed:  Sugar, honey. High protein gelatin. Flavored gelatin,  ices, or frozen ice pops that do not contain milk.   Avoid: Any others.  Fats and Oils  Allowed:  None are allowed.   Avoid: All are avoided.  Beverages  Allowed:  Carbonated beverages, cereal beverages, coffee (regular or decaffeinated), or tea.   Avoid: Any others.  Condiments  Allowed:  Iodized salt.   Avoid: Any others, including pepper.  Supplements  Allowed:  Liquid nutrition beverages.   Avoid: Any others that contain lactose or fiber.  SAMPLE MEAL PLAN Breakfast  4 oz strained orange juice.    to 1 cup gelatin (plain or fortified).   1 cup beverage (coffee or tea).   Sugar, if desired.  Midmorning Snack   cup gelatin (plain or fortified).  Lunch  1 cup broth or consomm.   4 oz strained grapefruit juice.    cup gelatin (plain or fortified).   1 cup beverage (coffee or tea).   Sugar, if desired.  Midafternoon Snack   cup fruit ice.    cup strained fruit juice.  Dinner  1 cup broth or consomm.    cup cranberry juice.    cup flavored gelatin (plain or fortified).   1 cup beverage (coffee or tea).   Sugar, if desired.  Evening Snack  4 oz strained apple juice (vitamin C-fortified).    cup flavored gelatin (plain or fortified).  Document Released: 08/19/2005 Document Revised: 05/01/2011 Document Reviewed: 11/16/2010 [facility] Patient Information 2012 [place], [place].Hypokalemia Hypokalemia means a low potassium level in the blood. Symptoms may include muscle weakness and cramping, fatigue, abdominal pain, vomiting, constipation, or irregularities of the heartbeat. Sometimes hypokalemia is discovered by your caregiver if you are taking certain medicines for high blood pressure or kidney disease.  Potassium is an electrolyte that helps regulate the amount of fluid in the body. It also stimulates muscle contraction and maintains a stable acid-base balance. If potassium levels go too low or too high, your health may be in danger. You are  at risk for developing shock, heart, and lung problems. Hypokalemia can occur if you have excessive diarrhea, vomiting, or sweating. Potassium can be lost through your kidneys in the urine. Certain common medicines can also cause potassium loss, especially water pills (diuretics). The same is possible with cortisone medications or certain types of antibiotics. Low potassium can be dangerous if you are taking certain heart medicines. In diabetes, your potassium may fall after you take insulin, especially if your diabetes had been out of control for a while. In rare cases, potassium may be low because you are not getting enough in your diet.  In adults, a potassium level below 3.5 mEq/L is usually considered low. Hypokalemia can be treated with potassium supplements taken by mouth and a diet that is high in potassium. Foods with high potassium content are:  Peas, lentils, lima beans, nuts, and dried fruit.   Whole grain and bran cereals and breads.   Fresh fruit and vegetables. Examples include:   Bananas.   Cantaloupe.   Grapefruit.   Oranges.   Tomatoes.   Honeydew melons.   Potatoes.   Peaches.   Orange and tomato  juices.   Meats.  See your caregiver as instructed for a follow-up blood test to be sure your potassium is back to normal. SEEK MEDICAL CARE IF:   You have nausea, vomiting, constipation, or abdominal pain.   You have palpitations or irregular heartbeats, chest pain or shortness of breath.   You have muscle cramps or weakness or fatigue.   You have lethargy.  SEEK IMMEDIATE MEDICAL CARE IF:   You have paralysis.   You have confusion or other mental status changes.  Document Released: 08/19/2005 Document Revised: 05/01/2011 Document Reviewed: 12/13/2009 [facility] Patient Information 2012 [place], [place].

## 2011-10-21 NOTE — ED Notes (Signed)
Patient states onset one day ago general abdominal achy with nausea emesis and loose stool.  States did not sleep well during the night . Abdomen soft nondistended Airway intact bilateral equal chest rise and fall. Patient states same symptoms happened 3 times in the past 2 years.  Given phenergan IV in the past with a home prescription.

## 2011-10-21 NOTE — ED Notes (Signed)
Patient states feels better pain now 4/10 achy resting comfortably on stretcher. Family friend at bedside.

## 2011-10-21 NOTE — ED Notes (Signed)
Pt admits to drinking heavily yesterday

## 2011-10-21 NOTE — ED Notes (Signed)
Pt here with N/V/D starting yesterday; pt sts unable to keep down POs

## 2011-10-21 NOTE — ED Notes (Signed)
Patient tolerated fluids without incident

## 2011-10-21 NOTE — ED Provider Notes (Signed)
History     CSN: [id number]  Arrival date & time 10/21/11  1311   First MD Initiated Contact with Patient 10/21/11 1551      Chief Complaint  Patient presents with  . Emesis  . Diarrhea    (Consider location/radiation/quality/duration/timing/severity/associated sxs/prior treatment) HPI Susan Francis is a 56 y.o. female presents with c/o vomiting and diarrhea leading to desire to be assessed in the ED. The sx(s) have been present for 1 day. Additional concerns are she had some petit mal seizures last night. Causative factors are not known. Palliative factors are nothing. The distress associated is moderate. The disorder has been present for 1 day.     Past Medical History  Diagnosis Date  . Seizure   . Hepatitis C     History reviewed. No pertinent past surgical history.  History reviewed. No pertinent family history.  History  Substance Use Topics  . Smoking status: Current Everyday Smoker  . Smokeless tobacco: Not on file  . Alcohol Use: Yes    OB History    Grav Para Term Preterm Abortions TAB SAB Ect Mult Living                  Review of Systems  All other systems reviewed and are negative.    Allergies  Adhesive and Oxycontin  Home Medications   Current Outpatient Rx  Name Route Sig Dispense Refill  . CITALOPRAM HYDROBROMIDE 20 MG PO TABS Oral Take 30 mg by mouth daily.    Marland Kitchen ESOMEPRAZOLE MAGNESIUM 40 MG PO CPDR Oral Take 40 mg by mouth daily before breakfast.    . LEVETIRACETAM 750 MG PO TABS Oral Take 750 mg by mouth every 12 (twelve) hours.    Marland Kitchen LORAZEPAM 1 MG PO TABS Oral Take 1 tablet (1 mg total) by mouth every 6 (six) hours as needed for anxiety. 10 tablet 0  . ONDANSETRON HCL 4 MG PO TABS Oral Take 2 tablets (8 mg total) by mouth every 6 (six) hours as needed for nausea. 12 tablet 0    BP 156/81  Pulse 79  Temp(Src) 99.5 F (37.5 C) (Oral)  Resp 19  SpO2 95%  Physical Exam  Nursing note and vitals reviewed. Constitutional: She is  oriented to person, place, and time. She appears well-developed and well-nourished.  HENT:  Head: Normocephalic and atraumatic.  Eyes: Conjunctivae and EOM are normal. Pupils are equal, round, and reactive to light.  Neck: Normal range of motion and phonation normal. Neck supple.  Cardiovascular: Normal rate, regular rhythm and intact distal pulses.   Pulmonary/Chest: Effort normal and breath sounds normal. She exhibits no tenderness.  Abdominal: Soft. She exhibits no distension. There is no tenderness (diffuse, mild). There is no guarding.       Hypoactive BS.  Musculoskeletal: Normal range of motion.  Neurological: She is alert and oriented to person, place, and time. She has normal strength. She exhibits normal muscle tone.  Skin: Skin is warm and dry.  Psychiatric: She has a normal mood and affect. Her behavior is normal. Judgment and thought content normal.    ED Course  Procedures (including critical care time)  Labs Reviewed  CBC - Abnormal; Notable for the following:    RBC 5.51 (*)    Hemoglobin 17.6 (*)    HCT 49.3 (*)    All other components within normal limits  URINALYSIS, ROUTINE W REFLEX MICROSCOPIC - Abnormal; Notable for the following:    Color, Urine AMBER (*) BIOCHEMICALS  MAY BE AFFECTED BY COLOR   Ketones, ur 40 (*)    Protein, ur 30 (*)    All other components within normal limits  COMPREHENSIVE METABOLIC PANEL - Abnormal; Notable for the following:    Potassium 3.0 (*)    Chloride 95 (*)    Glucose, Bld 186 (*)    AST 115 (*)    ALT 92 (*)    Total Bilirubin 2.0 (*)    All other components within normal limits  DIFFERENTIAL  LIPASE, BLOOD  ETHANOL  URINE MICROSCOPIC-ADD ON   Dg Abd Acute W/chest  10/21/2011  *RADIOLOGY REPORT*  Clinical Data: Abdominal pain and vomiting.  ACUTE ABDOMEN SERIES (ABDOMEN 2 VIEW & CHEST 1 VIEW)  Comparison: None.  Findings: The bowel gas pattern is normal.  There is no free peritoneal air.  The soft tissues have a normal  appearance.  The upright chest view demonstrates mildly accentuated bronchial markings.  There are no infiltrates or edematous changes.  The heart is normal in size.  IMPRESSION: Normal bowel gas pattern and no evidence for free peritoneal air. No evidence for active chest disease.  Original Report Authenticated By: Rolla Plate, M.D.   Emergency department treatment: IV fluid bolus and then drip at 125 cc per hour. Also, treated with Zofran, potassium and Ativan. At 1901, patient is comfortable has tolerated oral fluids and feels like she is ready to go. She requests Xanax to use at home.   1. Gastroenteritis   2. Hypokalemia   3. Anxiety   4. Seizure disorder       MDM  Vomiting and diarrhea consistent with gastroenteritis and secondary hypokalemia. Patient has improved with treatment in the emergency department and is stable for discharge. Doubt serious bacterial illness, colitis, metabolic instability, or unstable seizure disorder.        Flint Melter, MD 10/21/11 [phone]

## 2013-05-17 ENCOUNTER — Other Ambulatory Visit: Payer: Self-pay | Admitting: Internal Medicine

## 2013-05-17 DIAGNOSIS — B1710 Acute hepatitis C without hepatic coma: Secondary | ICD-10-CM

## 2013-05-21 ENCOUNTER — Ambulatory Visit
Admission: RE | Admit: 2013-05-21 | Discharge: 2013-05-21 | Disposition: A | Discharge status: Home / Self Care | Payer: Medicare Other | Source: Ambulatory Visit | Attending: Internal Medicine | Admitting: Internal Medicine

## 2013-05-21 DIAGNOSIS — B1710 Acute hepatitis C without hepatic coma: Secondary | ICD-10-CM

## 2013-06-10 ENCOUNTER — Encounter (HOSPITAL_COMMUNITY): Payer: Self-pay | Admitting: Emergency Medicine

## 2013-06-10 ENCOUNTER — Emergency Department (HOSPITAL_COMMUNITY): Payer: Medicare Other

## 2013-06-10 ENCOUNTER — Inpatient Hospital Stay (HOSPITAL_COMMUNITY)
Admission: EM | Admit: 2013-06-10 | Discharge: 2013-06-13 | DRG: 392 | Disposition: A | Discharge status: Home / Self Care | Payer: Medicare Other | Attending: Internal Medicine | Admitting: Internal Medicine

## 2013-06-10 DIAGNOSIS — R9431 Abnormal electrocardiogram [ECG] [EKG]: Secondary | ICD-10-CM | POA: Diagnosis present

## 2013-06-10 DIAGNOSIS — R112 Nausea with vomiting, unspecified: Secondary | ICD-10-CM

## 2013-06-10 DIAGNOSIS — R7989 Other specified abnormal findings of blood chemistry: Secondary | ICD-10-CM

## 2013-06-10 DIAGNOSIS — N9489 Other specified conditions associated with female genital organs and menstrual cycle: Secondary | ICD-10-CM | POA: Diagnosis present

## 2013-06-10 DIAGNOSIS — R748 Abnormal levels of other serum enzymes: Secondary | ICD-10-CM | POA: Diagnosis present

## 2013-06-10 DIAGNOSIS — R109 Unspecified abdominal pain: Secondary | ICD-10-CM

## 2013-06-10 DIAGNOSIS — B182 Chronic viral hepatitis C: Secondary | ICD-10-CM | POA: Diagnosis present

## 2013-06-10 DIAGNOSIS — K5289 Other specified noninfective gastroenteritis and colitis: Principal | ICD-10-CM | POA: Diagnosis present

## 2013-06-10 DIAGNOSIS — F10239 Alcohol dependence with withdrawal, unspecified: Secondary | ICD-10-CM

## 2013-06-10 DIAGNOSIS — E876 Hypokalemia: Secondary | ICD-10-CM | POA: Diagnosis present

## 2013-06-10 DIAGNOSIS — N858 Other specified noninflammatory disorders of uterus: Secondary | ICD-10-CM | POA: Diagnosis present

## 2013-06-10 DIAGNOSIS — F1020 Alcohol dependence, uncomplicated: Secondary | ICD-10-CM | POA: Diagnosis present

## 2013-06-10 DIAGNOSIS — R1013 Epigastric pain: Secondary | ICD-10-CM | POA: Diagnosis present

## 2013-06-10 DIAGNOSIS — K529 Noninfective gastroenteritis and colitis, unspecified: Secondary | ICD-10-CM

## 2013-06-10 DIAGNOSIS — K8010 Calculus of gallbladder with chronic cholecystitis without obstruction: Secondary | ICD-10-CM | POA: Diagnosis present

## 2013-06-10 DIAGNOSIS — F1210 Cannabis abuse, uncomplicated: Secondary | ICD-10-CM | POA: Diagnosis present

## 2013-06-10 DIAGNOSIS — K8020 Calculus of gallbladder without cholecystitis without obstruction: Secondary | ICD-10-CM | POA: Diagnosis present

## 2013-06-10 DIAGNOSIS — R0789 Other chest pain: Secondary | ICD-10-CM | POA: Insufficient documentation

## 2013-06-10 DIAGNOSIS — K219 Gastro-esophageal reflux disease without esophagitis: Secondary | ICD-10-CM | POA: Diagnosis present

## 2013-06-10 DIAGNOSIS — I10 Essential (primary) hypertension: Secondary | ICD-10-CM | POA: Diagnosis present

## 2013-06-10 DIAGNOSIS — Z8711 Personal history of peptic ulcer disease: Secondary | ICD-10-CM

## 2013-06-10 DIAGNOSIS — F17200 Nicotine dependence, unspecified, uncomplicated: Secondary | ICD-10-CM | POA: Diagnosis present

## 2013-06-10 HISTORY — DX: Gastric ulcer, unspecified as acute or chronic, without hemorrhage or perforation: K25.9

## 2013-06-10 HISTORY — DX: Abnormal electrocardiogram (ECG) (EKG): R94.31

## 2013-06-10 LAB — URINE MICROSCOPIC-ADD ON

## 2013-06-10 LAB — CBC
HCT: 44.2 % (ref 36.0–46.0)
MCH: 32.2 pg (ref 26.0–34.0)
MCV: 91.1 fL (ref 78.0–100.0)
Platelets: 60 10*3/uL — ABNORMAL LOW (ref 150–400)
RBC: 4.85 MIL/uL (ref 3.87–5.11)
WBC: 4.6 10*3/uL (ref 4.0–10.5)

## 2013-06-10 LAB — COMPREHENSIVE METABOLIC PANEL
AST: 64 U/L — ABNORMAL HIGH (ref 0–37)
Albumin: 3.9 g/dL (ref 3.5–5.2)
Alkaline Phosphatase: 93 U/L (ref 39–117)
Chloride: 93 mEq/L — ABNORMAL LOW (ref 96–112)
Potassium: 2.8 mEq/L — ABNORMAL LOW (ref 3.5–5.1)
Total Bilirubin: 3.5 mg/dL — ABNORMAL HIGH (ref 0.3–1.2)
Total Protein: 7.4 g/dL (ref 6.0–8.3)

## 2013-06-10 LAB — URINALYSIS, ROUTINE W REFLEX MICROSCOPIC
Bilirubin Urine: NEGATIVE
Hgb urine dipstick: NEGATIVE
Ketones, ur: 15 mg/dL — AB
Nitrite: NEGATIVE
Specific Gravity, Urine: 1.013 (ref 1.005–1.030)
pH: 6.5 (ref 5.0–8.0)

## 2013-06-10 MED ORDER — IOHEXOL 300 MG/ML  SOLN
100.0000 mL | Freq: Once | INTRAMUSCULAR | Status: AC | PRN
  Administered 2013-06-10: 100 mL via INTRAVENOUS

## 2013-06-10 MED ORDER — ONDANSETRON HCL 4 MG/2ML IJ SOLN
4.0000 mg | Freq: Once | INTRAMUSCULAR | Status: AC
  Administered 2013-06-10: 4 mg via INTRAVENOUS
  Filled 2013-06-10: qty 2

## 2013-06-10 MED ORDER — ALPRAZOLAM 0.25 MG PO TABS: 0.2500 mg | ORAL_TABLET | Freq: Three times a day (TID) | ORAL | Status: DC | PRN

## 2013-06-10 MED ORDER — PROMETHAZINE HCL 25 MG/ML IJ SOLN
25.0000 mg | Freq: Once | INTRAMUSCULAR | Status: AC
  Administered 2013-06-10: 25 mg via INTRAVENOUS
  Filled 2013-06-10: qty 1

## 2013-06-10 MED ORDER — RANITIDINE HCL 150 MG PO CAPS: 150.0000 mg | ORAL_CAPSULE | Freq: Every day | ORAL | Status: DC

## 2013-06-10 MED ORDER — LORAZEPAM 1 MG PO TABS
0.5000 mg | ORAL_TABLET | Freq: Once | ORAL | Status: AC
  Administered 2013-06-10: 0.5 mg via ORAL
  Filled 2013-06-10: qty 1

## 2013-06-10 MED ORDER — HYDROMORPHONE HCL PF 1 MG/ML IJ SOLN
1.0000 mg | Freq: Once | INTRAMUSCULAR | Status: AC
  Administered 2013-06-10: 1 mg via INTRAVENOUS
  Filled 2013-06-10: qty 1

## 2013-06-10 MED ORDER — SODIUM CHLORIDE 0.9 % IV BOLUS (SEPSIS)
1000.0000 mL | Freq: Once | INTRAVENOUS | Status: AC
  Administered 2013-06-10: 1000 mL via INTRAVENOUS

## 2013-06-10 MED ORDER — IOHEXOL 300 MG/ML  SOLN
25.0000 mL | INTRAMUSCULAR | Status: AC
  Administered 2013-06-10: 25 mL via ORAL

## 2013-06-10 NOTE — ED Notes (Signed)
Family at bedside. 

## 2013-06-10 NOTE — ED Notes (Signed)
CT was called to inform them that the contrast has been completed.

## 2013-06-10 NOTE — H&P (Signed)
Date: 06/11/2013               Patient Name:  Susan Francis MRN: [id number]  DOB: 20-Oct-1955 Age / Sex: 56 y.o., female   PCP: Pcp Not In System Dr. Earl Gala with [facility]         Medical Service: Internal Medicine Teaching Service         Attending Physician: Dr. Kem Kays    First Contact: Dr. Mariea Clonts Pager: [phone]  Second Contact: Dr. Virgina Organ Pager: [phone]       After Hours (After 5p/  First Contact Pager: [phone]  weekends / holidays): Second Contact Pager: [phone]   Chief Complaint: abdominal pain, nausea/vomiting  History of Present Illness:  [name] is a 57 year old woman with history of alcohol abuse (with inpatient rehab in 2012), GERD, hepatitis C, HTN who presents with abdominal pain and nausea/vomiting.  Patient also reports history of three stomach ulcers though this is not documented in problem list, no EGD in record.    Patient states that her symptoms began this morning around 9am when she woke up.  She has been having burning abdominal pain "all along her esophagus."  She has vomited about 4x today, no blood; vomiting worsens the burning pain.  She also reports loose stools, about 3x since this morning, no blood.  Patient has 5 year history of alcohol abuse.  She has sought treatment including inpatient rehab in 2012 but continues to struggle with relapses.  Recently, she had been sober for 5 days, but two days ago (10/7) she went to [facility] store and purchased a pint of bourbon (her drink of choice); she drank the entire pint in one day which is typical for her.  She was feeling poorly yesterday 10/8 but when she woke up this morning still not feeling well and with new onset vomiting, she called EMS because she "couldn't take it anymore."  She does have a history of alcohol withdrawal seizures but does not feel like she is having symptoms of withdrawal at this time.  She also smokes cigarettes 1PPD and smokes marijuana (last 2 weeks ago), no other illicits.   On  ROS, patient states she does feel off balance at times which she attributes to drinking.  She also reports unintentional weight loss of approximately 50 pounds since 2008 which she attributes to drinking as well.  Denies fevers, headaches, chest pain, shortness of breath, cough, dysuria, vaginal bleeding or spotting (menopause years ago), lower extremity edema.    Patient is feeling much better since receiving Dilaudid, Zofran, Phenergan, 1L NS bolus in ED.  She is now most concerned about her difficult social situation.  She lives at home with her husband who is on hospice (and alcoholic/ex-alcoholic as well).  She manages his medications as well as her own and states that she has been very busy taking him to doctor's appointments lately as he is scheduled for AAA repair next week (?). She recently saw PCP Dr. Earl Gala who set her up with ?bereavement counseling.  She states that she "feels trapped" and uses alcohol as an escape even though she knows it is bad for her.   Meds: Current Facility-Administered Medications  Medication Dose Route Frequency Provider Last Rate Last Dose  . 0.9 % NaCl with KCl 40 mEq / L  infusion   Intravenous Continuous Judie Bonus, MD      . alum & mag hydroxide-simeth (MAALOX/MYLANTA) 200-200-20 MG/5ML suspension 30 mL  30 mL  Oral Q6H PRN Judie Bonus, MD      . ciprofloxacin (CIPRO) IVPB 400 mg  400 mg Intravenous Q12H Judie Bonus, MD      . citalopram (CELEXA) tablet 40 mg  40 mg Oral Daily Judie Bonus, MD      . folic acid (FOLVITE) tablet 1 mg  1 mg Oral Daily Judie Bonus, MD      . LORazepam (ATIVAN) tablet 1 mg  1 mg Oral Q6H PRN Judie Bonus, MD       Or  . LORazepam (ATIVAN) injection 1 mg  1 mg Intravenous Q6H PRN Judie Bonus, MD      . magnesium sulfate IVPB 4 g 100 mL  4 g Intravenous Once Judie Bonus, MD      . metroNIDAZOLE (FLAGYL) IVPB 500 mg  500 mg Intravenous Q8H Judie Bonus, MD      .  multivitamin with minerals tablet 1 tablet  1 tablet Oral Daily Judie Bonus, MD      . ondansetron [facility]) tablet 4 mg  4 mg Oral Q6H PRN Judie Bonus, MD       Or  . ondansetron [facility]) injection 4 mg  4 mg Intravenous Q6H PRN Judie Bonus, MD      . pantoprazole (PROTONIX) EC tablet 40 mg  40 mg Oral Daily Judie Bonus, MD      . potassium chloride SA (K-DUR,KLOR-CON) CR tablet 40 mEq  40 mEq Oral Q4H Judie Bonus, MD      . thiamine (VITAMIN B-1) tablet 100 mg  100 mg Oral Daily Judie Bonus, MD       Or  . thiamine (B-1) injection 100 mg  100 mg Intravenous Daily Judie Bonus, MD      home meds- Celexa 40 mg daily, potassium 10 meq daily  Allergies: Allergies as of 06/10/2013 - Review Complete 06/10/2013  Allergen Reaction Noted  . Adhesive [tape] Other (See Comments) 10/21/2011  . Oxycodone hcl Hives 10/21/2011   Past Medical History  Diagnosis Date  . Seizure   . Hepatitis C   . Stomach ulcer    History reviewed. No pertinent past surgical history. No family history on file. History   Social History  . Marital Status: Legally Separated    Spouse Name: N/A    Number of Children: N/A  . Years of Education: N/A   Occupational History  . Not on file.   Social History Main Topics  . Smoking status: Current Every Day Smoker  . Smokeless tobacco: Not on file  . Alcohol Use: Yes  . Drug Use: No  . Sexual Activity: Not on file   Other Topics Concern  . Not on file   Social History Narrative  . No narrative on file    Review of Systems: Review of Systems  Constitutional: Positive for weight loss and malaise/fatigue. Negative for fever and chills.  Eyes: Negative for blurred vision.  Respiratory: Negative for cough and shortness of breath.   Cardiovascular: Negative for chest pain, palpitations and leg swelling.  Gastrointestinal: Positive for heartburn, nausea, vomiting and abdominal pain. Negative for constipation and  blood in stool.  Genitourinary: Negative for dysuria and frequency.  Musculoskeletal: Negative for falls.  Neurological: Negative for dizziness, tingling, seizures, loss of consciousness and headaches.    Physical Exam: Blood pressure 171/95, pulse 90, temperature 99.4 F (37.4 C), temperature source Oral, resp. rate 20, height 5'  2" (1.575 m), weight 145 lb 8.1 oz (66 kg), SpO2 97.00%. General: alert, cooperative, and in no apparent distress HEENT: NCAT, vision grossly intact, oropharynx clear and non-erythematous  Neck: supple, no lymphadenopathy Lungs: clear to ascultation bilaterally, normal work of respiration, no wheezes, rales, ronchi Heart: regular rate and rhythm, no murmurs, gallops, or rubs Abdomen: soft, non-tender, non-distended, normal bowel sounds Extremities: 2+ DP pulses bilaterally, no cyanosis, clubbing, or edema Neurologic: alert & oriented X3, cranial nerves II-XII intact, strength grossly intact, sensation intact to light touch   Lab results: Basic Metabolic Panel:  Recent Labs  40/98/11 1644 06/11/13  NA 136  --   K 2.8*  --   CL 93*  --   CO2 21  --   GLUCOSE 159*  --   BUN <3*  --   CREATININE 0.61  --   CALCIUM 9.3  --   MG  --  1.4*   Liver Function Tests:  Recent Labs  06/10/13 1644  AST 64*  ALT 29  ALKPHOS 93  BILITOT 3.5*  PROT 7.4  ALBUMIN 3.9    Recent Labs  06/10/13 1644  LIPASE 38   CBC:  Recent Labs  06/10/13 1644  WBC 4.6  HGB 15.6*  HCT 44.2  MCV 91.1  PLT 60*   Urinalysis:  Recent Labs  06/10/13 1647  COLORURINE AMBER*  LABSPEC 1.013  PHURINE 6.5  GLUCOSEU 100*  HGBUR NEGATIVE  BILIRUBINUR NEGATIVE  KETONESUR 15*  PROTEINUR 30*  UROBILINOGEN 2.0*  NITRITE NEGATIVE  LEUKOCYTESUR NEGATIVE    Imaging results:  Ct Abdomen Pelvis W Contrast  06/10/2013   CLINICAL DATA:  Nausea, emesis  EXAM: CT ABDOMEN AND PELVIS WITH CONTRAST  TECHNIQUE: Multidetector CT imaging of the abdomen and pelvis was  performed using the standard protocol following bolus administration of intravenous contrast.  CONTRAST:  OMNIPAQUE IOHEXOL 300 MG/ML  SOLN  COMPARISON:  Abdominal ultrasound 05/21/2013  FINDINGS: Lower Chest: The lung bases are clear. Visualized cardiac structures are within normal limits for size. No pericardial effusion. Unremarkable visualized distal thoracic esophagus. Atherosclerotic vascular calcifications noted in the coronary arteries.  Abdomen: Unremarkable CT appearance of the stomach, duodenum, spleen, adrenal glands pancreas and liver. Cholelithiasis. Gallbladder is slightly contracted. Diffuse mild gallbladder wall thickening measuring 4- 5 mm.  Unremarkable appearance of the bilateral kidneys. No focal solid lesion, hydronephrosis or nephrolithiasis. Subcentimeter hypoattenuating cortical lesions bilaterally are too small for accurate characterization but are statistically highly likely benign cysts.  Diffuse mild submucosal edema of the descending colon beginning at the splenic flexure and extending throughout the sigmoid colon to the rectum. There is mild interstitial stranding in the associated descending and sigmoid mesial colon as well as hypervascularity. Findings are consistent with colitis. No free fluid or free air. Normal appendix in the right lower quadrant.  Pelvis: Large abnormal heterogeneously enhancing soft tissue mass arising from the left ovary/adnexa measures approximately 10 x 8.9 7.0 cm. As the mass is also in close approximation with the uterus and could represent a pedunculated uterine fibroid. The right ovary is unremarkable. No free fluid or suspicious adenopathy.  Bones/Soft Tissues: No acute fracture or aggressive appearing lytic or blastic osseous lesion.  Vascular: Scattered atherosclerotic vascular calcifications without aneurysmal dilatation or significant appearing stenosis.  IMPRESSION: 1. Long segment colitis extending from the colon through the splenic flexure.  Differential considerations include both infectious and inflammatory (ulcerative colitis) etiologies. Given the distribution and relative paucity of atherosclerotic vascular disease, ischemic colitis is  considered significantly less likely. 2. 10.4 cm soft tissue mass in the pelvis intimately associated with both the left adnexa and the inferior uterine body. Differential considerations include a large exophytic/pedunculated uterine fibroid and solid ovarian neoplasm. Recommend further evaluation with non emergent MRI of the pelvis with and without contrast as well as referral to gynecologic surgery for followup. 3. Atherosclerosis including coronary artery disease. Recommend assessment of coronary risk factors and consideration of medical therapy. 4. Cholelithiasis with diffuse mild gallbladder wall thickening. Findings suggest chronic cholecystitis. If there is clinical concern for acute cholecystitis, right upper quadrant ultrasound could further evaluate.   Electronically Signed   By: Malachy Moan M.D.   On: 06/10/2013 20:57   Other results: EKG: sinus rhythm, normal axis, QT interval prolongation (525), nonspecific ST changes  Assessment & Plan by Problem: #Gastroenteritis and colitis- Pt presents with nausea/vomiting and abdominal pain since this morning, feeling much better (abdominal pain nearly gone and no longer nauseous) since receiving Dilaudid, Zofran, Phenergan, and 1L NS bolus in ED.  CT abdomen and pelvis with contrast showed long segment colitis extending from colon through splenic flexure (infectious vs. inflammatory).  GI symptoms temporally related to recent EtOH binge but given significance of CT findings, treating for infectious colitis with antibiotics and will monitor for improvement.  Will need outpatient GI follow-up for colonoscopy once colitis is resolved as well.  Pt afebrile, VSS, WBC 4.6.  Lipase within normal limits (38), UA negative. Will continue Zofran prn nausea but  need to carefully monitor given QT prolongation; pt is currently on telemetry monitoring and will repeat EKG in AM as well.  -admit to med surg telemetry  -ciprofloxacin 400 mg IV q12h, metronidazole 500 mg IV q8h; need to counsel pt about disulfiram reaction! -NS with K 40 meq at 100 cc/hr -clear liquid diet, advance diet as tolerated -Zofran 4 mg q6h prn nausea with close monitoring of QT interval -CBC, CMP in AM  -outpatient GI follow-up  #Hypokalemia- significant hypokalemia (2.8) as well as hypomagnesemia (1.4) on admission labs.  Pt has history of hypokalemia as she is on potassium supplementation as outpatient.  -telemetry -IVFs with K per above -K-dur 40 meq q4h x 2 doses -Mg supplementation 4 g  -repeat EKG in AM  #GERD- on Nexium at home but pt not taking. Pt was having burning chest pain; ACS unlikely but will cycle troponins given QT prolongation on EKG.  Pt reports subjective history of ulcers but no EGD in record; follow-up with pt in AM to find out who diagnosed this so we can obtain records.  -Protonix 40 mg daily  -cycle troponins  #EtOH abuse- Pt has been drinking heavily for past 5 years, sought inpatient treatment for alcohol abuse in 2012 but continues to relapse, often drinking 1 pint of bourbon/day.  She does not appear to be withdrawing at this time but in withdrawal time wind[place] and does have history of alcohol withdrawal seizures.  She is interested in repeat attempts at alcohol cessation.  Unfortunately, pt has findings suggestive of liver disease: Platelets 60 (baseline 120) on admission labs (so will hold off on anticoagulation for DVT prophylaxis and continue to monitor),  AST elevated at 64 with ALT 29 (2:1 ratio consistent with alcohol abuse). Of note, bilirubin also elevated at 3.5, CT abdomen showed cholelithiasis with mild gallbladder wall thickening that appears to be chronic cholecystitis. -thiamine 100 mg daily, folic acid 1 mg daily, multivitamin  -CiWA  protocol -social work consult (poor social situation  and pt desires resources for EtOH day counseling)  #Uterine mass- CT abdomen showed 10 cm soft tissue mass in pelvis associated with left adnexa and inferior uterine body; differential per radiology includes pedunculated uterine fibroid vs. solid ovarian neoplasm.  Pt states she went through menopause years ago and denies any vaginal bleeding/spotting.  At follow-up visit with PCP, she will need MRI pelvis (with and without contrast per radiology recs) and outpatient gynecology follow-up if needed.  #History of hepatitis C- biopsy proven; will check hepatitis panel for immunity against hepatitis A/B; if not immune, pt would be candidate for vaccination.  She could also have quantitative hepatitis C studies as outpatient to see if active infection; she may be candidate for treatment if she is able to stop drinking.  -hepatitis panel, HIV antibody, INR in AM     #HTN- BP 130-140s/80s during interview; no home meds. -continue to monitor   #Tobacco abuse- Pt smokes 1PPD, interested in quitting.  -nicotine patch -smoking cessation counseling  #DVT PPX- SCDs (given low platelet count)   #Code status- Full code  Dispo: Disposition is deferred at this time, awaiting improvement of current medical problems. Anticipated discharge in approximately 2-3 day(s).   The patient does not have a current PCP (Pcp Not In System) and does need an [facility] hospital follow-up appointment after discharge. Dr. Earl Gala with [facility]- could consider transfer to hospitalist service in AM.   Signed: Rocco Serene, MD 06/11/2013, 1:17 AM

## 2013-06-10 NOTE — ED Notes (Signed)
To ED from home via EMS for V/V onset today with abd pain, soft-non-tender abd, some dizzyness with standing, hx of ETOH abuse - last drink 2d ago, 18g LAC, 4 mg Zofran pta, VSS

## 2013-06-10 NOTE — ED Notes (Signed)
Back to bed and attached to the monitor

## 2013-06-10 NOTE — ED Provider Notes (Signed)
CSN: [id number]     Arrival date & time 06/10/13  1619 History   First MD Initiated Contact with Patient 06/10/13 1619     Chief Complaint  Patient presents with  . Nausea  . Emesis   (Consider location/radiation/quality/duration/timing/severity/associated sxs/prior Treatment) Patient is a 57 y.o. female presenting with vomiting.  Emesis Severity:  Moderate Duration:  1 day Timing:  Constant Number of daily episodes:  Multiple Quality:  Stomach contents Progression:  Unchanged Chronicity:  Recurrent Recent urination:  Decreased Relieved by:  None tried Associated symptoms: abdominal pain   Associated symptoms: no chills, no diarrhea, no fever, no sore throat and no URI   Abdominal pain:    Location:  Generalized   Quality:  Aching   Severity:  Moderate   Timing:  Constant   Chronicity:  Chronic Risk factors: alcohol use (dc use 2 days ago)     Past Medical History  Diagnosis Date  . Seizure   . Hepatitis C   . Stomach ulcer    History reviewed. No pertinent past surgical history. No family history on file. History  Substance Use Topics  . Smoking status: Current Every Day Smoker  . Smokeless tobacco: Not on file  . Alcohol Use: Yes   OB History   Grav Para Term Preterm Abortions TAB SAB Ect Mult Living                 Review of Systems  Constitutional: Negative for fever and chills.  HENT: Negative for congestion, rhinorrhea and sore throat.   Eyes: Negative for photophobia and visual disturbance.  Respiratory: Negative for cough and shortness of breath.   Cardiovascular: Negative for chest pain and leg swelling.  Gastrointestinal: Positive for vomiting and abdominal pain. Negative for nausea, diarrhea and constipation.  Endocrine: Negative for polyphagia and polyuria.  Genitourinary: Negative for dysuria, flank pain, vaginal bleeding, vaginal discharge and enuresis.  Musculoskeletal: Negative for back pain and gait problem.  Skin: Negative for color change  and rash.  Neurological: Negative for dizziness, syncope, light-headedness and numbness.  Hematological: Negative for adenopathy. Does not bruise/bleed easily.  All other systems reviewed and are negative.    Allergies  Adhesive and Oxycodone hcl  Home Medications   No current outpatient prescriptions on file. BP 93/59  Pulse 72  Temp(Src) 98.9 F (37.2 C) (Oral)  Resp 20  Ht 5\' 2"  (1.575 m)  Wt 145 lb 8.1 oz (66 kg)  BMI 26.61 kg/m2  SpO2 96% Physical Exam  Vitals reviewed. Constitutional: She is oriented to person, place, and time. She appears well-developed and well-nourished.  HENT:  Head: Normocephalic and atraumatic.  Right Ear: External ear normal.  Left Ear: External ear normal.  Eyes: Conjunctivae and EOM are normal. Pupils are equal, round, and reactive to light.  Neck: Normal range of motion. Neck supple.  Cardiovascular: Normal rate, regular rhythm, normal heart sounds and intact distal pulses.   Pulmonary/Chest: Effort normal and breath sounds normal.  Abdominal: Soft. Bowel sounds are normal. There is generalized tenderness. There is no CVA tenderness.  Musculoskeletal: Normal range of motion.  Neurological: She is alert and oriented to person, place, and time.  Skin: Skin is warm and dry.    ED Course  Procedures (including critical care time) Labs Review Labs Reviewed  COMPREHENSIVE METABOLIC PANEL - Abnormal; Notable for the following:    Potassium 2.8 (*)    Chloride 93 (*)    Glucose, Bld 159 (*)    BUN <3 (*)  AST 64 (*)    Total Bilirubin 3.5 (*)    All other components within normal limits  CBC - Abnormal; Notable for the following:    Hemoglobin 15.6 (*)    Platelets 60 (*)    All other components within normal limits  URINALYSIS, ROUTINE W REFLEX MICROSCOPIC - Abnormal; Notable for the following:    Color, Urine AMBER (*)    APPearance HAZY (*)    Glucose, UA 100 (*)    Ketones, ur 15 (*)    Protein, ur 30 (*)    Urobilinogen, UA  2.0 (*)    All other components within normal limits  URINE MICROSCOPIC-ADD ON - Abnormal; Notable for the following:    Squamous Epithelial / LPF MANY (*)    Bacteria, UA FEW (*)    Casts HYALINE CASTS (*)    All other components within normal limits  MAGNESIUM - Abnormal; Notable for the following:    Magnesium 1.4 (*)    All other components within normal limits  PROTIME-INR - Abnormal; Notable for the following:    Prothrombin Time 15.3 (*)    All other components within normal limits  COMPREHENSIVE METABOLIC PANEL - Abnormal; Notable for the following:    Sodium 134 (*)    Potassium 3.3 (*)    BUN <3 (*)    AST 46 (*)    Total Bilirubin 2.0 (*)    All other components within normal limits  CBC - Abnormal; Notable for the following:    Platelets 65 (*)    All other components within normal limits  TROPONIN I - Abnormal; Notable for the following:    Troponin I 0.33 (*)    All other components within normal limits  MAGNESIUM - Abnormal; Notable for the following:    Magnesium 2.7 (*)    All other components within normal limits  LIPASE, BLOOD  HIV ANTIBODY (ROUTINE TESTING)  TROPONIN I  HEPATITIS PANEL, ACUTE  TROPONIN I  CBC   Imaging Review Ct Abdomen Pelvis W Contrast  06/10/2013   CLINICAL DATA:  Nausea, emesis  EXAM: CT ABDOMEN AND PELVIS WITH CONTRAST  TECHNIQUE: Multidetector CT imaging of the abdomen and pelvis was performed using the standard protocol following bolus administration of intravenous contrast.  CONTRAST:  OMNIPAQUE IOHEXOL 300 MG/ML  SOLN  COMPARISON:  Abdominal ultrasound 05/21/2013  FINDINGS: Lower Chest: The lung bases are clear. Visualized cardiac structures are within normal limits for size. No pericardial effusion. Unremarkable visualized distal thoracic esophagus. Atherosclerotic vascular calcifications noted in the coronary arteries.  Abdomen: Unremarkable CT appearance of the stomach, duodenum, spleen, adrenal glands pancreas and liver.  Cholelithiasis. Gallbladder is slightly contracted. Diffuse mild gallbladder wall thickening measuring 4- 5 mm.  Unremarkable appearance of the bilateral kidneys. No focal solid lesion, hydronephrosis or nephrolithiasis. Subcentimeter hypoattenuating cortical lesions bilaterally are too small for accurate characterization but are statistically highly likely benign cysts.  Diffuse mild submucosal edema of the descending colon beginning at the splenic flexure and extending throughout the sigmoid colon to the rectum. There is mild interstitial stranding in the associated descending and sigmoid mesial colon as well as hypervascularity. Findings are consistent with colitis. No free fluid or free air. Normal appendix in the right lower quadrant.  Pelvis: Large abnormal heterogeneously enhancing soft tissue mass arising from the left ovary/adnexa measures approximately 10 x 8.9 7.0 cm. As the mass is also in close approximation with the uterus and could represent a pedunculated uterine fibroid. The right  ovary is unremarkable. No free fluid or suspicious adenopathy.  Bones/Soft Tissues: No acute fracture or aggressive appearing lytic or blastic osseous lesion.  Vascular: Scattered atherosclerotic vascular calcifications without aneurysmal dilatation or significant appearing stenosis.  IMPRESSION: 1. Long segment colitis extending from the colon through the splenic flexure. Differential considerations include both infectious and inflammatory (ulcerative colitis) etiologies. Given the distribution and relative paucity of atherosclerotic vascular disease, ischemic colitis is considered significantly less likely. 2. 10.4 cm soft tissue mass in the pelvis intimately associated with both the left adnexa and the inferior uterine body. Differential considerations include a large exophytic/pedunculated uterine fibroid and solid ovarian neoplasm. Recommend further evaluation with non emergent MRI of the pelvis with and without  contrast as well as referral to gynecologic surgery for followup. 3. Atherosclerosis including coronary artery disease. Recommend assessment of coronary risk factors and consideration of medical therapy. 4. Cholelithiasis with diffuse mild gallbladder wall thickening. Findings suggest chronic cholecystitis. If there is clinical concern for acute cholecystitis, right upper quadrant ultrasound could further evaluate.   Electronically Signed   By: Malachy Moan M.D.   On: 06/10/2013 20:57    EKG Interpretation     Ventricular Rate:    PR Interval:    QRS Duration:   QT Interval:    QTC Calculation:   R Axis:     Text Interpretation:              MDM   1. Abdominal pain   2. Alcohol withdrawal   3. Nausea and vomiting in adult   4. Colitis   5. Prolonged Q-T interval on ECG   6. Elevated troponin   7. Alcohol dependence    57 y.o. female  with pertinent PMH of hepatitis C presents with generalized burning abd pain x 1 day.  Pt denies fever, however has had nausea and vomiting over same time period.  She states that she has had chronic pain, however does not endorse worsening when eating or other known exacerbating factors.  Physical exam as above with generalized abd tenderness.  Given phenergan and NS bolus with excellent relief of symptoms.  CT scan of abd as above with chronic cholecystitis and colitis, pt has known ho cholelithiasis, no known ho colitis.  Consulted surgery who do not recommend emergent surgical therapy for cholecystitis.  Consulted medicine for admission for serial abd exams and further wu.    Labs and imaging as above reviewed by myself and attending,Dr. Romeo Apple, with whom case was discussed.   1. Abdominal pain   2. Alcohol withdrawal   3. Nausea and vomiting in adult   4. Colitis   5. Prolonged Q-T interval on ECG   6. Elevated troponin   7. Alcohol dependence         Noel Gerold, MD 06/11/13 1755

## 2013-06-10 NOTE — ED Notes (Signed)
PO [facility]

## 2013-06-10 NOTE — ED Notes (Signed)
Ambulated to the BR without difficulty.

## 2013-06-11 ENCOUNTER — Encounter (HOSPITAL_COMMUNITY): Payer: Self-pay | Admitting: *Deleted

## 2013-06-11 DIAGNOSIS — I517 Cardiomegaly: Secondary | ICD-10-CM

## 2013-06-11 DIAGNOSIS — F1020 Alcohol dependence, uncomplicated: Secondary | ICD-10-CM | POA: Diagnosis present

## 2013-06-11 DIAGNOSIS — K5289 Other specified noninfective gastroenteritis and colitis: Principal | ICD-10-CM

## 2013-06-11 DIAGNOSIS — R109 Unspecified abdominal pain: Secondary | ICD-10-CM

## 2013-06-11 DIAGNOSIS — K219 Gastro-esophageal reflux disease without esophagitis: Secondary | ICD-10-CM | POA: Diagnosis present

## 2013-06-11 DIAGNOSIS — K529 Noninfective gastroenteritis and colitis, unspecified: Secondary | ICD-10-CM | POA: Diagnosis present

## 2013-06-11 DIAGNOSIS — K8020 Calculus of gallbladder without cholecystitis without obstruction: Secondary | ICD-10-CM

## 2013-06-11 DIAGNOSIS — R9431 Abnormal electrocardiogram [ECG] [EKG]: Secondary | ICD-10-CM

## 2013-06-11 DIAGNOSIS — R7989 Other specified abnormal findings of blood chemistry: Secondary | ICD-10-CM

## 2013-06-11 DIAGNOSIS — N858 Other specified noninflammatory disorders of uterus: Secondary | ICD-10-CM | POA: Diagnosis present

## 2013-06-11 DIAGNOSIS — R112 Nausea with vomiting, unspecified: Secondary | ICD-10-CM

## 2013-06-11 LAB — COMPREHENSIVE METABOLIC PANEL
AST: 46 U/L — ABNORMAL HIGH (ref 0–37)
Albumin: 3.5 g/dL (ref 3.5–5.2)
BUN: <3 mg/dL — ABNORMAL LOW (ref 6–23)
CO2: 26 mEq/L (ref 19–32)
Calcium: 8.8 mg/dL (ref 8.4–10.5)
Chloride: 97 mEq/L (ref 96–112)
Creatinine, Ser: 0.72 mg/dL (ref 0.50–1.10)
GFR calc Af Amer: >90 mL/min (ref >90)
GFR calc non Af Amer: >90 mL/min (ref >90)
Total Bilirubin: 2 mg/dL — ABNORMAL HIGH (ref 0.3–1.2)

## 2013-06-11 LAB — PROTIME-INR
INR: 1.24 (ref 0.00–1.49)
Prothrombin Time: 15.3 seconds — ABNORMAL HIGH (ref 11.6–15.2)

## 2013-06-11 LAB — CBC
Hemoglobin: 13.7 g/dL (ref 12.0–15.0)
Platelets: 65 10*3/uL — ABNORMAL LOW (ref 150–400)
RBC: 4.28 MIL/uL (ref 3.87–5.11)
WBC: 6.2 10*3/uL (ref 4.0–10.5)

## 2013-06-11 LAB — MAGNESIUM
Magnesium: 1.4 mg/dL — ABNORMAL LOW (ref 1.5–2.5)
Magnesium: 2.7 mg/dL — ABNORMAL HIGH (ref 1.5–2.5)

## 2013-06-11 LAB — HIV ANTIBODY (ROUTINE TESTING W REFLEX): HIV: NONREACTIVE

## 2013-06-11 MED ORDER — NICOTINE 14 MG/24HR TD PT24
14.0000 mg | MEDICATED_PATCH | Freq: Every day | TRANSDERMAL | Status: DC
  Filled 2013-06-11 (×3): qty 1

## 2013-06-11 MED ORDER — LORAZEPAM 2 MG/ML IJ SOLN: 1.0000 mg | Freq: Four times a day (QID) | INTRAMUSCULAR | Status: DC | PRN

## 2013-06-11 MED ORDER — POTASSIUM CHLORIDE CRYS ER 20 MEQ PO TBCR
40.0000 meq | EXTENDED_RELEASE_TABLET | Freq: Once | ORAL | Status: AC
  Administered 2013-06-11: 40 meq via ORAL
  Filled 2013-06-11 (×2): qty 2

## 2013-06-11 MED ORDER — ASPIRIN 81 MG PO CHEW
81.0000 mg | CHEWABLE_TABLET | Freq: Every day | ORAL | Status: DC
  Administered 2013-06-12 – 2013-06-13 (×2): 81 mg via ORAL
  Filled 2013-06-11 (×3): qty 1

## 2013-06-11 MED ORDER — ADULT MULTIVITAMIN W/MINERALS CH
1.0000 | ORAL_TABLET | Freq: Every day | ORAL | Status: DC
  Administered 2013-06-11 – 2013-06-13 (×3): 1 via ORAL
  Filled 2013-06-11 (×3): qty 1

## 2013-06-11 MED ORDER — POTASSIUM CHLORIDE CRYS ER 20 MEQ PO TBCR
40.0000 meq | EXTENDED_RELEASE_TABLET | ORAL | Status: AC
  Administered 2013-06-11 (×2): 40 meq via ORAL
  Filled 2013-06-11 (×2): qty 2

## 2013-06-11 MED ORDER — METOPROLOL TARTRATE 12.5 MG HALF TABLET
12.5000 mg | ORAL_TABLET | Freq: Two times a day (BID) | ORAL | Status: DC
  Administered 2013-06-11 – 2013-06-12 (×3): 12.5 mg via ORAL
  Filled 2013-06-11 (×8): qty 1

## 2013-06-11 MED ORDER — LORAZEPAM 1 MG PO TABS
1.0000 mg | ORAL_TABLET | Freq: Four times a day (QID) | ORAL | Status: DC | PRN
  Filled 2013-06-11: qty 1

## 2013-06-11 MED ORDER — INFLUENZA VAC SPLIT QUAD 0.5 ML IM SUSP
0.5000 mL | INTRAMUSCULAR | Status: AC
  Administered 2013-06-12: 0.5 mL via INTRAMUSCULAR
  Filled 2013-06-11: qty 0.5

## 2013-06-11 MED ORDER — CIPROFLOXACIN IN D5W 400 MG/200ML IV SOLN
400.0000 mg | Freq: Two times a day (BID) | INTRAVENOUS | Status: DC
  Administered 2013-06-11: 400 mg via INTRAVENOUS
  Filled 2013-06-11 (×2): qty 200

## 2013-06-11 MED ORDER — PNEUMOCOCCAL VAC POLYVALENT 25 MCG/0.5ML IJ INJ
0.5000 mL | INJECTION | INTRAMUSCULAR | Status: AC
  Administered 2013-06-12: 0.5 mL via INTRAMUSCULAR
  Filled 2013-06-11: qty 0.5

## 2013-06-11 MED ORDER — CITALOPRAM HYDROBROMIDE 40 MG PO TABS
40.0000 mg | ORAL_TABLET | Freq: Every day | ORAL | Status: DC
  Filled 2013-06-11: qty 1

## 2013-06-11 MED ORDER — POTASSIUM CHLORIDE IN NACL 40-0.9 MEQ/L-% IV SOLN
INTRAVENOUS | Status: DC
  Administered 2013-06-11: 100 mL/h via INTRAVENOUS
  Administered 2013-06-11 – 2013-06-12 (×3): via INTRAVENOUS
  Filled 2013-06-11 (×8): qty 1000

## 2013-06-11 MED ORDER — MAGNESIUM SULFATE 40 MG/ML IJ SOLN
4.0000 g | Freq: Once | INTRAMUSCULAR | Status: AC
  Administered 2013-06-11: 02:00:00 4 g via INTRAVENOUS
  Filled 2013-06-11: qty 100

## 2013-06-11 MED ORDER — ASPIRIN 325 MG PO TABS
325.0000 mg | ORAL_TABLET | Freq: Once | ORAL | Status: AC
  Administered 2013-06-11: 11:00:00 325 mg via ORAL
  Filled 2013-06-11: qty 1

## 2013-06-11 MED ORDER — VITAMIN B-1 100 MG PO TABS
100.0000 mg | ORAL_TABLET | Freq: Every day | ORAL | Status: DC
  Administered 2013-06-11 – 2013-06-13 (×3): 100 mg via ORAL
  Filled 2013-06-11 (×3): qty 1

## 2013-06-11 MED ORDER — ONDANSETRON HCL 4 MG PO TABS: 4.0000 mg | ORAL_TABLET | Freq: Four times a day (QID) | ORAL | Status: DC | PRN

## 2013-06-11 MED ORDER — FOLIC ACID 1 MG PO TABS
1.0000 mg | ORAL_TABLET | Freq: Every day | ORAL | Status: DC
  Administered 2013-06-11 – 2013-06-13 (×3): 1 mg via ORAL
  Filled 2013-06-11 (×3): qty 1

## 2013-06-11 MED ORDER — ALUM & MAG HYDROXIDE-SIMETH 200-200-20 MG/5ML PO SUSP: 30.0000 mL | Freq: Four times a day (QID) | ORAL | Status: DC | PRN

## 2013-06-11 MED ORDER — THIAMINE HCL 100 MG/ML IJ SOLN
100.0000 mg | Freq: Every day | INTRAMUSCULAR | Status: DC
  Filled 2013-06-11 (×3): qty 1

## 2013-06-11 MED ORDER — ONDANSETRON HCL 4 MG/2ML IJ SOLN: 4.0000 mg | Freq: Four times a day (QID) | INTRAMUSCULAR | Status: DC | PRN

## 2013-06-11 MED ORDER — POTASSIUM CHLORIDE CRYS ER 20 MEQ PO TBCR
20.0000 meq | EXTENDED_RELEASE_TABLET | Freq: Once | ORAL | Status: AC
  Administered 2013-06-11: 20 meq via ORAL
  Filled 2013-06-11: qty 1

## 2013-06-11 MED ORDER — METRONIDAZOLE IN NACL 5-0.79 MG/ML-% IV SOLN
500.0000 mg | Freq: Three times a day (TID) | INTRAVENOUS | Status: DC
  Administered 2013-06-11 – 2013-06-13 (×8): 500 mg via INTRAVENOUS
  Filled 2013-06-11 (×9): qty 100

## 2013-06-11 MED ORDER — PANTOPRAZOLE SODIUM 40 MG PO TBEC
40.0000 mg | DELAYED_RELEASE_TABLET | Freq: Every day | ORAL | Status: DC
  Administered 2013-06-11 – 2013-06-13 (×3): 40 mg via ORAL
  Filled 2013-06-11 (×3): qty 1

## 2013-06-11 MED ORDER — ENOXAPARIN SODIUM 80 MG/0.8ML ~~LOC~~ SOLN
70.0000 mg | Freq: Two times a day (BID) | SUBCUTANEOUS | Status: DC
  Administered 2013-06-11 – 2013-06-12 (×3): 70 mg via SUBCUTANEOUS
  Filled 2013-06-11 (×4): qty 0.8

## 2013-06-11 NOTE — H&P (Signed)
INTERNAL MEDICINE TEACHING SERVICE Attending Admission Note  Date: 06/11/2013  Patient name: DEYANA WNUK  Medical record number: [id number]  Date of birth: [birth year]    I have seen and evaluated Alycia Rossetti and discussed their care with the Residency Team.   57 yr old female with history of alcohol abuse, hepatitis C, hypertension, presented with abdominal pain, nausea, and vomiting.  The patient is hemodynamically stable.  She had a finding on CT abdomen concerning for a uterine mass, states she has history of fibroids.  She needs Gyn follow up to biopsy these to make sure no sarcomatous degeneration.  She has a troponin elevation.  Cardiology has been consulted.  Need to rule out NSTEMI, continue to trend.  The patient has a prolonged QT interval, need to discontinue medications at this time that worsen this (celexa) and replace magnesium (at least 4 g IV mag sulfate and replace potassium). She has findings of colitis on CT abdomen, would continue flagyl for now.  The patient is an [facility] patient (her PCP in their group), advise [facility].  Jonah Blue, DO, FACP [facility] 06/11/2013, 12:16 PM

## 2013-06-11 NOTE — Progress Notes (Signed)
Utilization review completed. Calum Cormier, RN, BSN. 

## 2013-06-11 NOTE — Progress Notes (Signed)
Clinical Social Work Department BRIEF PSYCHOSOCIAL ASSESSMENT 06/11/2013  Patient:  Susan Francis, Susan Francis     Account Number:  [id number]     Admit date:  06/10/2013  Clinical Social Worker:  Carren Rang  Date/Time:  06/11/2013 01:50 PM  Referred by:  Physician  Date Referred:  06/11/2013 Referred for  Substance Abuse   Other Referral:   Interview type:  Patient Other interview type:    PSYCHOSOCIAL DATA Living Status:  ALONE Admitted from facility:   Level of care:   Primary support name:  Christy Primary support relationship to patient:  CHILD, ADULT Degree of support available:   Good    CURRENT CONCERNS Current Concerns  Substance Abuse   Other Concerns:    SOCIAL WORK ASSESSMENT / PLAN CSW met with patient to discuss alcohol use- pt reports that she started drinking back up in 2009 when she made some new friend. Patient stated she became an alcoholic ever since and has been very depressed because her husband is ill. Patient states she is 3 days sober and plans on quitting. CSW offered resources to patient and patient was receptive to resources, stating that the hospice facility where her husband is, gave her many resources for alcohol use and she plans to use them when she gets out of the hospital. CSW signing off at this point. Please re consult if CSW needs arise.   Assessment/plan status:  No Further Intervention Required Other assessment/ plan:   Information/referral to community resources:   Alcohol Use Resources    PATIENT'S/FAMILY'S RESPONSE TO PLAN OF CARE: Patient stated she was thankful for CSW visit.       Maree Krabbe, MSW, Theresia Majors [phone]

## 2013-06-11 NOTE — ED Notes (Signed)
Report given, Potassium addressed.

## 2013-06-11 NOTE — Consult Note (Signed)
CARDIOLOGY CONSULT NOTE  Patient ID: Susan Francis MRN: [id number] DOB/AGE: 1955-11-12 57 y.o.  Admit date: 06/10/2013 Reason for Consultation: Elevated troponin  HPI: 57 yo with history of ETOH abuse with ETOH hepatitis and possible cirrhosis, thrombocytopenia, GERD, and HCV presented with severe abdominal burning and vomiting yesterday. Patient reports that she drank a pint of bourbon the day before yesterday.  Yesterday, she woke up with severe burning pain (like she was "on fire") across her entire abdomen.  She began to throw up and the burning pain spread up her central chest with vomiting.  She came to the ER.  Abdominal CT showed possible chronic cholecystitis along with a long segment of colitis.  Surgery evaluated her, do not think that symptoms are from her gallbladder.  She had an ECG, which showed prolonged QT interval in the setting of hypokalemia and hypomagnesemia. Troponin was drawn and was elevated at 0.33.  Today, patient continues to have mild soreness in her abdomen.  No chest pain.  No prior cardiac history.  Cardiac RFs are HTN and smoking.   Review of systems complete and found to be negative unless listed above in HPI  Past Medical History: 1. ETOH abuse with ETOH hepatitis and possible cirrhosis.  2. 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  3. HTN 4. HCV 5. GERD 6. PUD: Was diagnosed by EGD with gastric ulcers about 10 years ago.  7. Active smoker  FH: No premature CAD.   Social History  . Marital Status: Legally Separated    Spouse Name: N/A    Number of Children: N/A  . Years of Education: N/A   Occupational History  . Not on file.   Social History Main Topics  . Smoking status: Current Every Day Smoker  . Smokeless tobacco: Not on file  . Alcohol Use: Yes, heavy  . Drug Use: Marijuana  . Sexual Activity: Not on file   Other Topics Concern  . Not on file   Social History Narrative  . No narrative on file       Prescriptions prior to admission  Medication Sig Dispense Refill  . ALPRAZolam (XANAX) 0.5 MG tablet Take 0.5 mg by mouth 3 (three) times daily as needed for sleep or anxiety.      . citalopram (CELEXA) 40 MG tablet Take 40 mg by mouth daily.      Marland Kitchen esomeprazole (NEXIUM) 40 MG capsule Take 40 mg by mouth daily before breakfast.      . potassium chloride (K-DUR) 10 MEQ tablet Take 10 mEq by mouth daily.       Current meds . [START ON 06/12/2013] aspirin  81 mg Oral Daily  . aspirin  325 mg Oral Once  . enoxaparin (LOVENOX) injection  70 mg Subcutaneous Q12H  . folic acid  1 mg Oral Daily  . [START ON 06/12/2013] influenza vac split quadrivalent PF  0.5 mL Intramuscular Tomorrow-1000  . metoprolol tartrate  12.5 mg Oral BID  . metronidazole  500 mg Intravenous Q8H  . multivitamin with minerals  1 tablet Oral Daily  . nicotine  14 mg Transdermal Daily  . pantoprazole  40 mg Oral Daily  . [START ON 06/12/2013] pneumococcal 23 valent vaccine  0.5 mL Intramuscular Tomorrow-1000  . potassium chloride  40 mEq Oral Once  . thiamine  100 mg Oral Daily   Or  . thiamine  100 mg Intravenous Daily     Physical exam Blood pressure 105/71, pulse 79, temperature  99.2 F (37.3 C), temperature source Oral, resp. rate 24, height 5\' 2"  (1.575 m), weight 145 lb 8.1 oz (66 kg), SpO2 96.00%. General: NAD Neck: No JVD, no thyromegaly or thyroid nodule.  Lungs: Clear to auscultation bilaterally with normal respiratory effort. CV: Nondisplaced PMI.  Heart regular S1/S2, no S3/S4, no murmur.  No peripheral edema.  No carotid bruit.  Normal pedal pulses.  Abdomen: Soft, mild peri-umbilical tenderness, no hepatosplenomegaly, no distention.  Skin: Intact without lesions or rashes.  Neurologic: Alert and oriented x 3.  Psych: Normal affect. Extremities: No clubbing or cyanosis.  HEENT: Normal.   Labs:   Lab Results  Component Value Date   WBC 6.2 06/11/2013   HGB 13.7 06/11/2013   HCT 39.1  06/11/2013   MCV 91.4 06/11/2013   PLT 65* 06/11/2013    Recent Labs Lab 06/11/13 0546  NA 134*  K 3.3*  CL 97  CO2 26  BUN <3*  CREATININE 0.72  CALCIUM 8.8  PROT 6.6  BILITOT 2.0*  ALKPHOS 80  ALT 22  AST 46*  GLUCOSE 90   Lab Results  Component Value Date   TROPONINI 0.33* 06/11/2013  Lipase normal    Radiology: - CT abdomen: Evidence for chronic cholecystitis, long segment of colitis  EKG: NSR, prolonged QTc   ASSESSMENT AND PLAN:  57 yo with history of ETOH abuse with ETOH hepatitis and possible cirrhosis, thrombocytopenia, GERD, and HCV presented with severe abdominal burning and vomiting yesterday.  She was found to have mildly elevated troponin and prolonged QT interval on ECG.  1. Abdominal pain/nausea/vomiting: Lipase negative.  Suspect that the pain could be from colitis. Alternatively, she could have gastritis or an ulcer given ETOH abuse. I do not think this is cardiac. She had chest pain yesterday that seemed to be primarily in the setting of vomiting.  No chest pain today. Per primary service.  2. Elevated troponin: Uncertain of the cause of this.  She does not have florid ETOH withdrawal currently (not hypertensive/tachycardic) though she was quite hypertensive at times last night.  I do not think that her symptoms have been related to cardiac ischemia.  - Cycle troponin to peak  - Agree with ASA 81 mg daily (platelet level does not prohibit this) and beta blocker use.  Can increase beta blocker if needed for HTN/tachycardia.  - Would get echocardiogram.  - If troponin does not show a marked upward trend, would eventually obtain a Cardiolite.  If remains stable, could be as outpatient.  3. Prolonged QT interval: Suspect multifactorial.  She was on Celexa and was quite hypokalemic and hypomagnesemic.  Would stop Celexa for now and would replace K and Mg aggressively.   Marca Ancona 06/11/2013 10:35 AM   Signed: @ME1 @ 06/11/2013, 10:20 AM Co-Sign  MD

## 2013-06-11 NOTE — Progress Notes (Signed)
Agree Kemyah Buser, MD, MPH, FACS Pager: [phone]  

## 2013-06-11 NOTE — Progress Notes (Signed)
Subjective: Ms. Darwish was seen and examined at bedside this morning. She reports feeling very well and much improved since admission. She denies any chest pain, N/V/D, abdominal pain, fever, shortness of breath, or any urinary complaints today.   Objective: Vital signs in last 24 hours: Filed Vitals:   06/10/13 2345 06/11/13 0000 06/11/13 0050 06/11/13 0512  BP: 155/96 134/83 171/95 105/71  Pulse: 92 87 90 79  Temp:   99.4 F (37.4 C) 99.2 F (37.3 C)  TempSrc:   Oral Oral  Resp:  16 20 24   Height:   5\' 2"  (1.575 m)   Weight:   145 lb 8.1 oz (66 kg)   SpO2: 95% 89% 97% 96%   Weight change:   Intake/Output Summary (Last 24 hours) at 06/11/13 1155 Last data filed at 06/11/13 0900  Gross per 24 hour  Intake 1041.33 ml  Output      0 ml  Net 1041.33 ml   Vitals reviewed. General: resting in bed, NAD HEENT: PERRL, EOMI, no scleral icterus Cardiac: RRR, no rubs, murmurs or gallops Pulm: clear to auscultation bilaterally, no wheezes, rales, or rhonchi Abd: soft, mild tenderness to palpation epigastric region, nondistended, BS present Ext: warm and well perfused, no pedal edema Neuro: alert and oriented X3, cranial nerves II-XII grossly intact, strength and sensation to light touch equal in bilateral upper and lower extremities  Lab Results: Basic Metabolic Panel:  Recent Labs Lab 06/10/13 1644 06/11/13 06/11/13 0546  NA 136  --  134*  K 2.8*  --  3.3*  CL 93*  --  97  CO2 21  --  26  GLUCOSE 159*  --  90  BUN <3*  --  <3*  CREATININE 0.61  --  0.72  CALCIUM 9.3  --  8.8  MG  --  1.4*  --    Liver Function Tests:  Recent Labs Lab 06/10/13 1644 06/11/13 0546  AST 64* 46*  ALT 29 22  ALKPHOS 93 80  BILITOT 3.5* 2.0*  PROT 7.4 6.6  ALBUMIN 3.9 3.5    Recent Labs Lab 06/10/13 1644  LIPASE 38   CBC:  Recent Labs Lab 06/10/13 1644 06/11/13 0546  WBC 4.6 6.2  HGB 15.6* 13.7  HCT 44.2 39.1  MCV 91.1 91.4  PLT 60* 65*   Cardiac Enzymes:  Recent  Labs Lab 06/11/13 0546  TROPONINI 0.33*   Coagulation:  Recent Labs Lab 06/11/13 0546  LABPROT 15.3*  INR 1.24   Urine Drug Screen: Drugs of Abuse     Component Value Date/Time   LABOPIA NONE DETECTED 11/04/2010 1703   COCAINSCRNUR NONE DETECTED 11/04/2010 1703   LABBENZ POSITIVE* 11/04/2010 1703   AMPHETMU NONE DETECTED 11/04/2010 1703   THCU POSITIVE* 11/04/2010 1703   LABBARB  Value: NONE DETECTED        DRUG SCREEN FOR MEDICAL PURPOSES ONLY.  IF CONFIRMATION IS NEEDED FOR ANY PURPOSE, NOTIFY LAB WITHIN 5 DAYS.        LOWEST DETECTABLE LIMITS FOR URINE DRUG SCREEN Drug Class       Cutoff (ng/mL) Amphetamine      1000 Barbiturate      200 Benzodiazepine   200 Tricyclics       300 Opiates          300 Cocaine          300 THC              50 11/04/2010 1703    Urinalysis:  Recent Labs Lab 06/10/13 1647  COLORURINE AMBER*  LABSPEC 1.013  PHURINE 6.5  GLUCOSEU 100*  HGBUR NEGATIVE  BILIRUBINUR NEGATIVE  KETONESUR 15*  PROTEINUR 30*  UROBILINOGEN 2.0*  NITRITE NEGATIVE  LEUKOCYTESUR NEGATIVE   Studies/Results: Ct Abdomen Pelvis W Contrast  06/10/2013   CLINICAL DATA:  Nausea, emesis  EXAM: CT ABDOMEN AND PELVIS WITH CONTRAST  TECHNIQUE: Multidetector CT imaging of the abdomen and pelvis was performed using the standard protocol following bolus administration of intravenous contrast.  CONTRAST:  OMNIPAQUE IOHEXOL 300 MG/ML  SOLN  COMPARISON:  Abdominal ultrasound 05/21/2013  FINDINGS: Lower Chest: The lung bases are clear. Visualized cardiac structures are within normal limits for size. No pericardial effusion. Unremarkable visualized distal thoracic esophagus. Atherosclerotic vascular calcifications noted in the coronary arteries.  Abdomen: Unremarkable CT appearance of the stomach, duodenum, spleen, adrenal glands pancreas and liver. Cholelithiasis. Gallbladder is slightly contracted. Diffuse mild gallbladder wall thickening measuring 4- 5 mm.  Unremarkable appearance of the  bilateral kidneys. No focal solid lesion, hydronephrosis or nephrolithiasis. Subcentimeter hypoattenuating cortical lesions bilaterally are too small for accurate characterization but are statistically highly likely benign cysts.  Diffuse mild submucosal edema of the descending colon beginning at the splenic flexure and extending throughout the sigmoid colon to the rectum. There is mild interstitial stranding in the associated descending and sigmoid mesial colon as well as hypervascularity. Findings are consistent with colitis. No free fluid or free air. Normal appendix in the right lower quadrant.  Pelvis: Large abnormal heterogeneously enhancing soft tissue mass arising from the left ovary/adnexa measures approximately 10 x 8.9 7.0 cm. As the mass is also in close approximation with the uterus and could represent a pedunculated uterine fibroid. The right ovary is unremarkable. No free fluid or suspicious adenopathy.  Bones/Soft Tissues: No acute fracture or aggressive appearing lytic or blastic osseous lesion.  Vascular: Scattered atherosclerotic vascular calcifications without aneurysmal dilatation or significant appearing stenosis.  IMPRESSION: 1. Long segment colitis extending from the colon through the splenic flexure. Differential considerations include both infectious and inflammatory (ulcerative colitis) etiologies. Given the distribution and relative paucity of atherosclerotic vascular disease, ischemic colitis is considered significantly less likely. 2. 10.4 cm soft tissue mass in the pelvis intimately associated with both the left adnexa and the inferior uterine body. Differential considerations include a large exophytic/pedunculated uterine fibroid and solid ovarian neoplasm. Recommend further evaluation with non emergent MRI of the pelvis with and without contrast as well as referral to gynecologic surgery for followup. 3. Atherosclerosis including coronary artery disease. Recommend assessment of  coronary risk factors and consideration of medical therapy. 4. Cholelithiasis with diffuse mild gallbladder wall thickening. Findings suggest chronic cholecystitis. If there is clinical concern for acute cholecystitis, right upper quadrant ultrasound could further evaluate.   Electronically Signed   By: Malachy Moan M.D.   On: 06/10/2013 20:57   Medications: I have reviewed the patient's current medications. Scheduled Meds: . [START ON 06/12/2013] aspirin  81 mg Oral Daily  . enoxaparin (LOVENOX) injection  70 mg Subcutaneous Q12H  . folic acid  1 mg Oral Daily  . [START ON 06/12/2013] influenza vac split quadrivalent PF  0.5 mL Intramuscular Tomorrow-1000  . metoprolol tartrate  12.5 mg Oral BID  . metronidazole  500 mg Intravenous Q8H  . multivitamin with minerals  1 tablet Oral Daily  . nicotine  14 mg Transdermal Daily  . pantoprazole  40 mg Oral Daily  . [START ON 06/12/2013] pneumococcal 23 valent vaccine  0.5  mL Intramuscular Tomorrow-1000  . thiamine  100 mg Oral Daily   Or  . thiamine  100 mg Intravenous Daily   Continuous Infusions: . 0.9 % NaCl with KCl 40 mEq / L 100 mL/hr at 06/11/13 0141   PRN Meds:.alum & mag hydroxide-simeth, LORazepam, LORazepam, ondansetron (ZOFRAN) IV, ondansetron Assessment/Plan: Principal Problem:   NSTEMI (non-ST elevated myocardial infarction) Active Problems:   Alcohol dependence   Prolonged Q-T interval on ECG   GERD (gastroesophageal reflux disease)   Uterine mass   Colitis  Ms. Yanes is a 57 year old female with PMH of Hep C, GERD, PUD, and chronic tobacco dependence admitted for abdominal pain and gastroenteritis.   NSTEMI--non-specific st changes on initial EKG and troponin x1 positive 0.33. No current chest pain. Repeat EKG significant for prolonged QT.  Her symptoms of nausea, vomiting, and substernal chest and abdominal pain could possibly be indicative of ischemia but could also be secondary to GI cause.  Hypertensive overnight,  and smoker included as risk factors.  No known history of hyperlipidemia and unknown family history.  -appreciate cardiology following: Dr. Shirlee Latch was kind enough to see this morning, does not think relation to cardiac ischemia. Recommends echo, agree with ASA and can increase BB if needed for HTN/tachycardia.  -echo -cycle CE, if continue to trend up, cardiolite, if stable may be able to be done as outpatient per cards -ASA -Lovenox but monitor platelets -BB started metoprolol 12.5mg  BID, patient denies any recent cocaine use  #Gastroenteritis and colitis--improved today. Admitted for nausea/vomiting and abdominal pain x1 day. Hx of chronic alcohol dependence and recent binge drinking.  CT abdomen and pelvis with contrast on admission showed long segment colitis extending from colon through splenic flexure (infectious vs. Inflammatory), Cholelithiasis (down trending AST). GI symptoms temporally related to recent EtOH binge but given significance of CT findings, treating for infectious colitis with antibiotics and will monitor for improvement. Surgery, Dr. Renaldo Fiddler did see on admission, do not think related to GB related and unclear of infectious colitis.  Will likely need GI follow-up for colonoscopy once colitis and for ongoing possible GERD.  She is afebrile, no leukocytosis, lipase within normal limits (38), UA negative.  -d/c ciprofloxacin in setting of prolonged qt but will continue flagyl  -IVF and replaced K and Mag -clear liquid diet, advance diet as tolerated  -d/c Zofran in setting of prolonged qt, nausea and vomiting has resolved -trend electrolytes -consider GI follow up per surgery  Hypokalemia and Hypomagnesemia--likely in setting of GI loss with vomiting. K on admission 2.8, improved to 3.3 today with replacement. Mag down to 1.4 today. -continue to replace K and Mag -slowly advancing diet and IVF  Prolonged QTc--up to 620 on EKG today. Troponin x1 positive. Avoiding QT  prolonging medications: discontinued ciprofloxacin, zofran, and celexa. Mag down to 1.4.  -replace electrolytes -continue to monitor qtc  #GERD-on Nexium 40mg  at home but she has not been taking. ? Hx of PUD per chart review.  Presented with burning chest pain and abdominal pain after recent alcohol binge and vomiting.  -continue PPI -consider GI evaluation (inpatient vs. Outpatient)  #EtOH abuse--hx of drinking heavily for past 5 years, sought inpatient treatment for alcohol abuse in 2012 but continues to relapse, often drinking 1 pint of bourbon/day. She does not appear to be withdrawing at this time but was in withdrawal time window and does have history of alcohol withdrawal seizures. She is interested in repeat attempts at alcohol cessation. Unfortunately has findings suggestive  of liver disease: Platelets 60 (baseline 120) on admission labs, AST elevated at 64 now trending down with ALT 29 (2:1 ratio consistent with alcohol abuse). Of note, bilirubin also elevated at 3.5, CT abdomen showed cholelithiasis with mild gallbladder wall thickening that appears to be chronic cholecystitis.  -thiamine 100 mg daily, folic acid 1 mg daily, multivitamin  -CIWA protocol  -recommend social work consult (poor social situation and pt desires resources for EtOH day counseling)   #Uterine mass- CT abdomen showed 10 cm soft tissue mass in pelvis associated with left adnexa and inferior uterine body; differential per radiology includes pedunculated uterine fibroid vs. solid ovarian neoplasm. Pt states she went through menopause years ago and denies any vaginal bleeding/spotting but does endorse hx of fibroids. Per radiology, recommend non emergent MRI of pelvis with and without contrast and outpatient gynecology and surgery follow-up if needed.   #History of hepatitis C- biopsy proven; will check hepatitis panel for immunity against hepatitis A/B; if not immune, pt would be candidate for vaccination. Likely  cirrhosis with chronic alcohol use as well.  She could also have quantitative hepatitis C studies as outpatient to see if active infection; she may be candidate for treatment if she is able to stop drinking.  -hepatitis panel, HIV antibody pending -INR 1.24  #HTN- BP 130-140s/80s during interview; no home meds. Hypertensive overnight up to 190/110.  Denies hx of HTN.  -continue to monitor  -start BB in setting of ?NSTEMI, metoprolol 12.5mg  bid (may increase per cards in setting of tachycardia and HTN)  Dispo: Patient is assigned patient of Dr. Earl Gala. Spoke to [facility] hospitalist service with Dr. Rito Ehrlich who will take over for today and also left message as instructed for Dr. Earl Gala service to resume care. Disposition is deferred at this time, awaiting improvement of current medical problems.    The patient does have a current PCP Dr. Earl Gala and does need an [facility] hospital follow-up appointment after discharge.  The patient does not have transportation limitations that hinder transportation to clinic appointments.  Services Needed at time of discharge: Y = Yes, Blank = No PT:   OT:   RN:   Equipment:   Other:     LOS: 1 day   Darden Palmer, MD 06/11/2013, 11:55 AM

## 2013-06-11 NOTE — Progress Notes (Signed)
  Echocardiogram 2D Echocardiogram has been performed.  Susan Francis 06/11/2013, 1:29 PM

## 2013-06-11 NOTE — Progress Notes (Addendum)
Trop 0.33 called to RN - MD made aware. EKG ordered.

## 2013-06-11 NOTE — Progress Notes (Addendum)
ANTICOAGULATION CONSULT NOTE - Initial Consult   Pharmacy Consult for Lovenox Indication: chest pain/ACS  Allergies  Allergen Reactions  . Adhesive [Tape] Other (See Comments)    Thin skin  . Oxycodone Hcl Hives    Patient Measurements: Height: 5\' 2"  (157.5 cm) Weight: 145 lb 8.1 oz (66 kg) IBW/kg (Calculated) : 50.1 Heparin Dosing Weight:   Vital Signs: Temp: 99.2 F (37.3 C) (10/10 0512) Temp src: Oral (10/10 0512) BP: 105/71 mmHg (10/10 0512) Pulse Rate: 79 (10/10 0512)  Labs:  Recent Labs  06/10/13 1644 06/11/13 0546  HGB 15.6* 13.7  HCT 44.2 39.1  PLT 60* 65*  LABPROT  --  15.3*  INR  --  1.24  CREATININE 0.61 0.72  TROPONINI  --  0.33*    Estimated Creatinine Clearance: 67.5 ml/min (by C-G formula based on Cr of 0.72).   Medical History: Past Medical History  Diagnosis Date  . Seizure   . Hepatitis C   . Stomach ulcer     Medications:  Scheduled:  . aspirin  325 mg Oral Once  . folic acid  1 mg Oral Daily  . [START ON 06/12/2013] influenza vac split quadrivalent PF  0.5 mL Intramuscular Tomorrow-1000  . metoprolol tartrate  12.5 mg Oral BID  . metronidazole  500 mg Intravenous Q8H  . multivitamin with minerals  1 tablet Oral Daily  . nicotine  14 mg Transdermal Daily  . pantoprazole  40 mg Oral Daily  . [START ON 06/12/2013] pneumococcal 23 valent vaccine  0.5 mL Intramuscular Tomorrow-1000  . thiamine  100 mg Oral Daily   Or  . thiamine  100 mg Intravenous Daily    Assessment: 57yo female with NSTEMI.  Pt with history of EtOH and Hep C.  Baseline Pltc 65 which is stable compared to admission pltc with no signs of bleeding.  MD aware of pltc.  BL INR 1.24, on no Coumadin pta.    Goal of Therapy:  Anti-Xa level 0.6-1.2 units/ml 4hrs after LMWH dose given Monitor platelets by anticoagulation protocol: Yes   Plan:  1.  Lovenox 70mg  SQ q12 2.  CBC q48 (instead of q72) to follow CBC more closely  Marisue Humble, PharmD Clinical  Pharmacist [facility]

## 2013-06-11 NOTE — Consult Note (Signed)
Reason for Consult:ct findings of gallstones, wall thickening Referring Physician: Dr Purvis Sheffield   Susan Francis is an 57 y.o. female.  HPI: 89 yof who has history of hep C (treated with IFN in past), GERD who according to er notes presented with abdominal pain and n/v. When I evaluated her she states that she has burning and points at her chest and epigastrium associated with n/v and inability to keep anything down.  She does not remember much.  She states she has been drinking heavily and is not sure if she had a seizure last night.  She does not have hematemesis.  She has had loose stools but this is not really different and these are not bloody.  She tells me that she feels like she is in withdrawal right now.  She does not really complain of abdominal pain right now.  She states she thinks she has history of ulcer disease in past.  She underwent u/s a couple weeks ago that shows cholelithiasis that she says was done for hep c and not for symptoms referable to her gb at that time.  She does not really report ruq pain or other symptoms that would make me think this is biliary in nature right now.     Past Medical History  Diagnosis Date  . Seizure   . Hepatitis C   . Stomach ulcer   GERD  Psh; c/s, rotator cuff surgery  No family history on file.  Social History:  reports that she has been smoking.  She does not have any smokeless tobacco history on file. She reports that she drinks alcohol. She reports that she does not use illicit drugs. She drinks significant amount of etoh  Allergies:  Allergies  Allergen Reactions  . Adhesive [Tape] Other (See Comments)    Thin skin  . Oxycodone Hcl Hives    Medications: I have reviewed the patient's current medications.  Results for orders placed during the hospital encounter of 06/10/13 (from the past 48 hour(s))  COMPREHENSIVE METABOLIC PANEL     Status: Abnormal   Collection Time    06/10/13  4:44 PM      Result Value Range    Sodium 136  135 - 145 mEq/L   Potassium 2.8 (*) 3.5 - 5.1 mEq/L   Chloride 93 (*) 96 - 112 mEq/L   CO2 21  19 - 32 mEq/L   Glucose, Bld 159 (*) 70 - 99 mg/dL   BUN <3 (*) 6 - 23 mg/dL   Creatinine, Ser 1.61  0.50 - 1.10 mg/dL   Calcium 9.3  8.4 - 09.6 mg/dL   Total Protein 7.4  6.0 - 8.3 g/dL   Albumin 3.9  3.5 - 5.2 g/dL   AST 64 (*) 0 - 37 U/L   ALT 29  0 - 35 U/L   Alkaline Phosphatase 93  39 - 117 U/L   Total Bilirubin 3.5 (*) 0.3 - 1.2 mg/dL   GFR calc non Af Amer >90  >90 mL/min   GFR calc Af Amer >90  >90 mL/min   Comment: (NOTE)     The eGFR has been calculated using the CKD EPI equation.     This calculation has not been validated in all clinical situations.     eGFR's persistently <90 mL/min signify possible Chronic Kidney     Disease.  CBC     Status: Abnormal   Collection Time    06/10/13  4:44 PM  Result Value Range   WBC 4.6  4.0 - 10.5 K/uL   RBC 4.85  3.87 - 5.11 MIL/uL   Hemoglobin 15.6 (*) 12.0 - 15.0 g/dL   HCT 40.9  81.1 - 91.4 %   MCV 91.1  78.0 - 100.0 fL   MCH 32.2  26.0 - 34.0 pg   MCHC 35.3  30.0 - 36.0 g/dL   RDW 78.2  95.6 - 21.3 %   Platelets 60 (*) 150 - 400 K/uL   Comment: PLATELET COUNT CONFIRMED BY SMEAR     REPEATED TO VERIFY     SPECIMEN CHECKED FOR CLOTS  LIPASE, BLOOD     Status: None   Collection Time    06/10/13  4:44 PM      Result Value Range   Lipase 38  11 - 59 U/L  URINALYSIS, ROUTINE W REFLEX MICROSCOPIC     Status: Abnormal   Collection Time    06/10/13  4:47 PM      Result Value Range   Color, Urine AMBER (*) YELLOW   Comment: BIOCHEMICALS MAY BE AFFECTED BY COLOR   APPearance HAZY (*) CLEAR   Specific Gravity, Urine 1.013  1.005 - 1.030   pH 6.5  5.0 - 8.0   Glucose, UA 100 (*) NEGATIVE mg/dL   Hgb urine dipstick NEGATIVE  NEGATIVE   Bilirubin Urine NEGATIVE  NEGATIVE   Ketones, ur 15 (*) NEGATIVE mg/dL   Protein, ur 30 (*) NEGATIVE mg/dL   Urobilinogen, UA 2.0 (*) 0.0 - 1.0 mg/dL   Nitrite NEGATIVE  NEGATIVE    Leukocytes, UA NEGATIVE  NEGATIVE  URINE MICROSCOPIC-ADD ON     Status: Abnormal   Collection Time    06/10/13  4:47 PM      Result Value Range   Squamous Epithelial / LPF MANY (*) RARE   WBC, UA 0-2  <3 WBC/hpf   RBC / HPF 0-2  <3 RBC/hpf   Bacteria, UA FEW (*) RARE   Casts HYALINE CASTS (*) NEGATIVE   Urine-Other MUCOUS PRESENT    MAGNESIUM     Status: Abnormal   Collection Time    06/11/13 12:00 AM      Result Value Range   Magnesium 1.4 (*) 1.5 - 2.5 mg/dL    Ct Abdomen Pelvis W Contrast  06/10/2013   CLINICAL DATA:  Nausea, emesis  EXAM: CT ABDOMEN AND PELVIS WITH CONTRAST  TECHNIQUE: Multidetector CT imaging of the abdomen and pelvis was performed using the standard protocol following bolus administration of intravenous contrast.  CONTRAST:  OMNIPAQUE IOHEXOL 300 MG/ML  SOLN  COMPARISON:  Abdominal ultrasound 05/21/2013  FINDINGS: Lower Chest: The lung bases are clear. Visualized cardiac structures are within normal limits for size. No pericardial effusion. Unremarkable visualized distal thoracic esophagus. Atherosclerotic vascular calcifications noted in the coronary arteries.  Abdomen: Unremarkable CT appearance of the stomach, duodenum, spleen, adrenal glands pancreas and liver. Cholelithiasis. Gallbladder is slightly contracted. Diffuse mild gallbladder wall thickening measuring 4- 5 mm.  Unremarkable appearance of the bilateral kidneys. No focal solid lesion, hydronephrosis or nephrolithiasis. Subcentimeter hypoattenuating cortical lesions bilaterally are too small for accurate characterization but are statistically highly likely benign cysts.  Diffuse mild submucosal edema of the descending colon beginning at the splenic flexure and extending throughout the sigmoid colon to the rectum. There is mild interstitial stranding in the associated descending and sigmoid mesial colon as well as hypervascularity. Findings are consistent with colitis. No free fluid or free air. Normal  appendix in the right lower quadrant.  Pelvis: Large abnormal heterogeneously enhancing soft tissue mass arising from the left ovary/adnexa measures approximately 10 x 8.9 7.0 cm. As the mass is also in close approximation with the uterus and could represent a pedunculated uterine fibroid. The right ovary is unremarkable. No free fluid or suspicious adenopathy.  Bones/Soft Tissues: No acute fracture or aggressive appearing lytic or blastic osseous lesion.  Vascular: Scattered atherosclerotic vascular calcifications without aneurysmal dilatation or significant appearing stenosis.  IMPRESSION: 1. Long segment colitis extending from the colon through the splenic flexure. Differential considerations include both infectious and inflammatory (ulcerative colitis) etiologies. Given the distribution and relative paucity of atherosclerotic vascular disease, ischemic colitis is considered significantly less likely. 2. 10.4 cm soft tissue mass in the pelvis intimately associated with both the left adnexa and the inferior uterine body. Differential considerations include a large exophytic/pedunculated uterine fibroid and solid ovarian neoplasm. Recommend further evaluation with non emergent MRI of the pelvis with and without contrast as well as referral to gynecologic surgery for followup. 3. Atherosclerosis including coronary artery disease. Recommend assessment of coronary risk factors and consideration of medical therapy. 4. Cholelithiasis with diffuse mild gallbladder wall thickening. Findings suggest chronic cholecystitis. If there is clinical concern for acute cholecystitis, right upper quadrant ultrasound could further evaluate.   Electronically Signed   By: Malachy Moan M.D.   On: 06/10/2013 20:57    Review of Systems  Constitutional: Negative for fever and chills.  Respiratory: Negative for cough.   Cardiovascular: Positive for chest pain.  Gastrointestinal: Positive for heartburn, nausea, vomiting and  abdominal pain. Negative for blood in stool and melena.   Blood pressure 171/95, pulse 90, temperature 99.4 F (37.4 C), temperature source Oral, resp. rate 20, height 5\' 2"  (1.575 m), weight 145 lb 8.1 oz (66 kg), SpO2 97.00%. Physical Exam  Constitutional: She is oriented to person, place, and time. She appears well-developed and well-nourished.  HENT:  Head: Normocephalic and atraumatic.  Eyes: No scleral icterus.  Neck: Neck supple.  Cardiovascular: Normal rate, regular rhythm and normal heart sounds.   Respiratory: Effort normal and breath sounds normal.  GI: Soft. Bowel sounds are normal. She exhibits no distension. There is no tenderness.  Lymphadenopathy:    She has no cervical adenopathy.  Neurological: She is alert and oriented to person, place, and time.    Assessment/Plan: Epigastric pain/gerd  I dont think her current symptoms are gb related.  She has u/s and ct recently with stones and some thickening on her ct but this may be chronic.  She does not give good history of symptoms though.  I don't think she needs lap chole right now.  I think treating gerd would be best and addressing etoh/hep c.  Im also not sure she has infectious colitis with lack of symptoms and labs that would correlate with ct findings.  Would consider gi evaluation.  She will also need evaluation of the mass in pelvis at some point also.  We will follow while she is here  [facility] 06/11/2013, 2:34 AM

## 2013-06-11 NOTE — Progress Notes (Signed)
Patient ID: Susan Francis, female   DOB: 10-Jul-1956, 57 y.o.   MRN: [id number]    Subjective: Pt feels better this morning.  She states she is sore, but no abdominal pain.  No further nausea or vomiting  Objective: Vital signs in last 24 hours: Temp:  [98.1 F (36.7 C)-99.4 F (37.4 C)] 99.2 F (37.3 C) (10/10 0512) Pulse Rate:  [63-92] 79 (10/10 0512) Resp:  [14-28] 24 (10/10 0512) BP: (105-190)/(71-110) 105/71 mmHg (10/10 0512) SpO2:  [89 %-100 %] 96 % (10/10 0512) Weight:  [145 lb 8.1 oz (66 kg)] 145 lb 8.1 oz (66 kg) (10/10 0050) Last BM Date: 06/09/13  Intake/Output from previous day: 10/09 0701 - 10/10 0700 In: 683.3 [I.V.:383.3; IV Piggyback:300] Out: -  Intake/Output this shift:    PE: Abd: soft, essentially NT, ND, +BS  Lab Results:   Recent Labs  06/10/13 1644 06/11/13 0546  WBC 4.6 6.2  HGB 15.6* 13.7  HCT 44.2 39.1  PLT 60* 65*   BMET  Recent Labs  06/10/13 1644 06/11/13 0546  NA 136 134*  K 2.8* 3.3*  CL 93* 97  CO2 21 26  GLUCOSE 159* 90  BUN <3* <3*  CREATININE 0.61 0.72  CALCIUM 9.3 8.8   PT/INR  Recent Labs  06/11/13 0546  LABPROT 15.3*  INR 1.24   CMP     Component Value Date/Time   NA 134* 06/11/2013 0546   K 3.3* 06/11/2013 0546   CL 97 06/11/2013 0546   CO2 26 06/11/2013 0546   GLUCOSE 90 06/11/2013 0546   BUN <3* 06/11/2013 0546   CREATININE 0.72 06/11/2013 0546   CALCIUM 8.8 06/11/2013 0546   PROT 6.6 06/11/2013 0546   ALBUMIN 3.5 06/11/2013 0546   AST 46* 06/11/2013 0546   ALT 22 06/11/2013 0546   ALKPHOS 80 06/11/2013 0546   BILITOT 2.0* 06/11/2013 0546   GFRNONAA >90 06/11/2013 0546   GFRAA >90 06/11/2013 0546   Lipase     Component Value Date/Time   LIPASE 38 06/10/2013 1644       Studies/Results: Ct Abdomen Pelvis W Contrast  06/10/2013   CLINICAL DATA:  Nausea, emesis  EXAM: CT ABDOMEN AND PELVIS WITH CONTRAST  TECHNIQUE: Multidetector CT imaging of the abdomen and pelvis was performed using the  standard protocol following bolus administration of intravenous contrast.  CONTRAST:  OMNIPAQUE IOHEXOL 300 MG/ML  SOLN  COMPARISON:  Abdominal ultrasound 05/21/2013  FINDINGS: Lower Chest: The lung bases are clear. Visualized cardiac structures are within normal limits for size. No pericardial effusion. Unremarkable visualized distal thoracic esophagus. Atherosclerotic vascular calcifications noted in the coronary arteries.  Abdomen: Unremarkable CT appearance of the stomach, duodenum, spleen, adrenal glands pancreas and liver. Cholelithiasis. Gallbladder is slightly contracted. Diffuse mild gallbladder wall thickening measuring 4- 5 mm.  Unremarkable appearance of the bilateral kidneys. No focal solid lesion, hydronephrosis or nephrolithiasis. Subcentimeter hypoattenuating cortical lesions bilaterally are too small for accurate characterization but are statistically highly likely benign cysts.  Diffuse mild submucosal edema of the descending colon beginning at the splenic flexure and extending throughout the sigmoid colon to the rectum. There is mild interstitial stranding in the associated descending and sigmoid mesial colon as well as hypervascularity. Findings are consistent with colitis. No free fluid or free air. Normal appendix in the right lower quadrant.  Pelvis: Large abnormal heterogeneously enhancing soft tissue mass arising from the left ovary/adnexa measures approximately 10 x 8.9 7.0 cm. As the mass is also in close  approximation with the uterus and could represent a pedunculated uterine fibroid. The right ovary is unremarkable. No free fluid or suspicious adenopathy.  Bones/Soft Tissues: No acute fracture or aggressive appearing lytic or blastic osseous lesion.  Vascular: Scattered atherosclerotic vascular calcifications without aneurysmal dilatation or significant appearing stenosis.  IMPRESSION: 1. Long segment colitis extending from the colon through the splenic flexure. Differential  considerations include both infectious and inflammatory (ulcerative colitis) etiologies. Given the distribution and relative paucity of atherosclerotic vascular disease, ischemic colitis is considered significantly less likely. 2. 10.4 cm soft tissue mass in the pelvis intimately associated with both the left adnexa and the inferior uterine body. Differential considerations include a large exophytic/pedunculated uterine fibroid and solid ovarian neoplasm. Recommend further evaluation with non emergent MRI of the pelvis with and without contrast as well as referral to gynecologic surgery for followup. 3. Atherosclerosis including coronary artery disease. Recommend assessment of coronary risk factors and consideration of medical therapy. 4. Cholelithiasis with diffuse mild gallbladder wall thickening. Findings suggest chronic cholecystitis. If there is clinical concern for acute cholecystitis, right upper quadrant ultrasound could further evaluate.   Electronically Signed   By: Malachy Moan M.D.   On: 06/10/2013 20:57    Anti-infectives: Anti-infectives   Start     Dose/Rate Route Frequency Ordered Stop   06/11/13 0100  ciprofloxacin (CIPRO) IVPB 400 mg  Status:  Discontinued     400 mg 200 mL/hr over 60 Minutes Intravenous Every 12 hours 06/11/13 0059 06/11/13 0752   06/11/13 0100  metroNIDAZOLE (FLAGYL) IVPB 500 mg     500 mg 100 mL/hr over 60 Minutes Intravenous Every 8 hours 06/11/13 0059         Assessment/Plan  1. N/V with likely gastroenteritis, possibly from ETOH 2. Colitis, doubt infectious  3. cholelithiasis  Plan: 1. Doubt her symptoms are related to biliary disease.  She will need outpatient GI follow up. 2. Suspect patient has cirrhosis given her elevated TB, thrombocytopenia, and etoh abuse, etc. 3. No surgical indications.  We will sign off.  LOS: 1 day    Nannette Zill E 06/11/2013, 8:34 AM Pager: [phone]

## 2013-06-12 ENCOUNTER — Encounter (HOSPITAL_COMMUNITY): Payer: Self-pay | Admitting: Interventional Cardiology

## 2013-06-12 DIAGNOSIS — K8020 Calculus of gallbladder without cholecystitis without obstruction: Secondary | ICD-10-CM | POA: Diagnosis present

## 2013-06-12 DIAGNOSIS — R7989 Other specified abnormal findings of blood chemistry: Secondary | ICD-10-CM

## 2013-06-12 DIAGNOSIS — N9489 Other specified conditions associated with female genital organs and menstrual cycle: Secondary | ICD-10-CM | POA: Diagnosis present

## 2013-06-12 DIAGNOSIS — R9431 Abnormal electrocardiogram [ECG] [EKG]: Secondary | ICD-10-CM | POA: Insufficient documentation

## 2013-06-12 LAB — CBC
MCHC: 33.9 g/dL (ref 30.0–36.0)
Platelets: 54 10*3/uL — ABNORMAL LOW (ref 150–400)
RDW: 15.7 % — ABNORMAL HIGH (ref 11.5–15.5)
WBC: 4.4 10*3/uL (ref 4.0–10.5)

## 2013-06-12 LAB — HEPATITIS PANEL, ACUTE
HCV Ab: REACTIVE — AB
Hep A IgM: NEGATIVE
Hep B C IgM: NEGATIVE
Hepatitis B Surface Ag: NEGATIVE

## 2013-06-12 LAB — GLUCOSE, CAPILLARY: Glucose-Capillary: 86 mg/dL (ref 70–99)

## 2013-06-12 MED ORDER — POTASSIUM CHLORIDE CRYS ER 20 MEQ PO TBCR
40.0000 meq | EXTENDED_RELEASE_TABLET | Freq: Once | ORAL | Status: AC
  Administered 2013-06-12: 15:00:00 40 meq via ORAL
  Filled 2013-06-12: qty 2

## 2013-06-12 MED ORDER — ENOXAPARIN SODIUM 30 MG/0.3ML ~~LOC~~ SOLN
30.0000 mg | SUBCUTANEOUS | Status: DC
  Administered 2013-06-13: 09:00:00 30 mg via SUBCUTANEOUS
  Filled 2013-06-12: qty 0.3

## 2013-06-12 NOTE — Progress Notes (Signed)
Subjective: Susan Francis is feeling better today.  Denies chest pain.  Still feels fatigued.  2-D echo revealed no wall motion abnormalities with an EF of 60-65% and mild diastolic dysfunction and very mild pulmonary hypertension.  Troponins are now negative.  Objective: Weight change:   Intake/Output Summary (Last 24 hours) at 06/12/13 1130 Last data filed at 06/12/13 0015  Gross per 24 hour  Intake    120 ml  Output      0 ml  Net    120 ml   Filed Vitals:   06/11/13 1226 06/11/13 1409 06/11/13 2047 06/12/13 0506  BP: 90/60 93/59 99/62  114/70  Pulse: 67 72 70 54  Temp:  98.9 F (37.2 C) 99 F (37.2 C) 98.3 F (36.8 C)  TempSrc:  Oral Oral Oral  Resp:  20 18 20   Height:      Weight:      SpO2:  96% 96% 97%     Lab Results: Results for orders placed during the hospital encounter of 06/10/13 (from the past 48 hour(s))  COMPREHENSIVE METABOLIC PANEL     Status: Abnormal   Collection Time    06/10/13  4:44 PM      Result Value Range   Sodium 136  135 - 145 mEq/L   Potassium 2.8 (*) 3.5 - 5.1 mEq/L   Chloride 93 (*) 96 - 112 mEq/L   CO2 21  19 - 32 mEq/L   Glucose, Bld 159 (*) 70 - 99 mg/dL   BUN <3 (*) 6 - 23 mg/dL   Creatinine, Ser 4.09  0.50 - 1.10 mg/dL   Calcium 9.3  8.4 - 81.1 mg/dL   Total Protein 7.4  6.0 - 8.3 g/dL   Albumin 3.9  3.5 - 5.2 g/dL   AST 64 (*) 0 - 37 U/L   ALT 29  0 - 35 U/L   Alkaline Phosphatase 93  39 - 117 U/L   Total Bilirubin 3.5 (*) 0.3 - 1.2 mg/dL   GFR calc non Af Amer >90  >90 mL/min   GFR calc Af Amer >90  >90 mL/min   Comment: (NOTE)     The eGFR has been calculated using the CKD EPI equation.     This calculation has not been validated in all clinical situations.     eGFR's persistently <90 mL/min signify possible Chronic Kidney     Disease.  CBC     Status: Abnormal   Collection Time    06/10/13  4:44 PM      Result Value Range   WBC 4.6  4.0 - 10.5 K/uL   RBC 4.85  3.87 - 5.11 MIL/uL   Hemoglobin 15.6 (*) 12.0 - 15.0 g/dL    HCT 91.4  78.2 - 95.6 %   MCV 91.1  78.0 - 100.0 fL   MCH 32.2  26.0 - 34.0 pg   MCHC 35.3  30.0 - 36.0 g/dL   RDW 21.3  08.6 - 57.8 %   Platelets 60 (*) 150 - 400 K/uL   Comment: PLATELET COUNT CONFIRMED BY SMEAR     REPEATED TO VERIFY     SPECIMEN CHECKED FOR CLOTS  LIPASE, BLOOD     Status: None   Collection Time    06/10/13  4:44 PM      Result Value Range   Lipase 38  11 - 59 U/L  URINALYSIS, ROUTINE W REFLEX MICROSCOPIC     Status: Abnormal   Collection Time  06/10/13  4:47 PM      Result Value Range   Color, Urine AMBER (*) YELLOW   Comment: BIOCHEMICALS MAY BE AFFECTED BY COLOR   APPearance HAZY (*) CLEAR   Specific Gravity, Urine 1.013  1.005 - 1.030   pH 6.5  5.0 - 8.0   Glucose, UA 100 (*) NEGATIVE mg/dL   Hgb urine dipstick NEGATIVE  NEGATIVE   Bilirubin Urine NEGATIVE  NEGATIVE   Ketones, ur 15 (*) NEGATIVE mg/dL   Protein, ur 30 (*) NEGATIVE mg/dL   Urobilinogen, UA 2.0 (*) 0.0 - 1.0 mg/dL   Nitrite NEGATIVE  NEGATIVE   Leukocytes, UA NEGATIVE  NEGATIVE  URINE MICROSCOPIC-ADD ON     Status: Abnormal   Collection Time    06/10/13  4:47 PM      Result Value Range   Squamous Epithelial / LPF MANY (*) RARE   WBC, UA 0-2  <3 WBC/hpf   RBC / HPF 0-2  <3 RBC/hpf   Bacteria, UA FEW (*) RARE   Casts HYALINE CASTS (*) NEGATIVE   Urine-Other MUCOUS PRESENT    MAGNESIUM     Status: Abnormal   Collection Time    06/11/13 12:00 AM      Result Value Range   Magnesium 1.4 (*) 1.5 - 2.5 mg/dL  PROTIME-INR     Status: Abnormal   Collection Time    06/11/13  5:46 AM      Result Value Range   Prothrombin Time 15.3 (*) 11.6 - 15.2 seconds   INR 1.24  0.00 - 1.49  COMPREHENSIVE METABOLIC PANEL     Status: Abnormal   Collection Time    06/11/13  5:46 AM      Result Value Range   Sodium 134 (*) 135 - 145 mEq/L   Potassium 3.3 (*) 3.5 - 5.1 mEq/L   Chloride 97  96 - 112 mEq/L   CO2 26  19 - 32 mEq/L   Glucose, Bld 90  70 - 99 mg/dL   BUN <3 (*) 6 - 23 mg/dL    Creatinine, Ser 4.09  0.50 - 1.10 mg/dL   Calcium 8.8  8.4 - 81.1 mg/dL   Total Protein 6.6  6.0 - 8.3 g/dL   Albumin 3.5  3.5 - 5.2 g/dL   AST 46 (*) 0 - 37 U/L   ALT 22  0 - 35 U/L   Alkaline Phosphatase 80  39 - 117 U/L   Total Bilirubin 2.0 (*) 0.3 - 1.2 mg/dL   GFR calc non Af Amer >90  >90 mL/min   GFR calc Af Amer >90  >90 mL/min   Comment: (NOTE)     The eGFR has been calculated using the CKD EPI equation.     This calculation has not been validated in all clinical situations.     eGFR's persistently <90 mL/min signify possible Chronic Kidney     Disease.  CBC     Status: Abnormal   Collection Time    06/11/13  5:46 AM      Result Value Range   WBC 6.2  4.0 - 10.5 K/uL   RBC 4.28  3.87 - 5.11 MIL/uL   Hemoglobin 13.7  12.0 - 15.0 g/dL   HCT 91.4  78.2 - 95.6 %   MCV 91.4  78.0 - 100.0 fL   MCH 32.0  26.0 - 34.0 pg   MCHC 35.0  30.0 - 36.0 g/dL   RDW 21.3  08.6 - 57.8 %  Platelets 65 (*) 150 - 400 K/uL   Comment: CONSISTENT WITH PREVIOUS RESULT  HIV ANTIBODY (ROUTINE TESTING)     Status: None   Collection Time    06/11/13  5:46 AM      Result Value Range   HIV NON REACTIVE  NON REACTIVE   Comment: Performed at [facility]  TROPONIN I     Status: Abnormal   Collection Time    06/11/13  5:46 AM      Result Value Range   Troponin I 0.33 (*) <0.30 ng/mL   Comment: REPEATED TO VERIFY     CRITICAL RESULT CALLED TO, READ BACK BY AND VERIFIED WITH:     R.ZELLNOR,RN 06/11/13 0826 CLARK,S                Due to the release kinetics of cTnI,     a negative result within the first hours     of the onset of symptoms does not rule out     myocardial infarction with certainty.     If myocardial infarction is still suspected,     repeat the test at appropriate intervals.  MAGNESIUM     Status: Abnormal   Collection Time    06/11/13 12:39 PM      Result Value Range   Magnesium 2.7 (*) 1.5 - 2.5 mg/dL  TROPONIN I     Status: None   Collection Time    06/11/13   2:39 PM      Result Value Range   Troponin I <0.30  <0.30 ng/mL   Comment:            Due to the release kinetics of cTnI,     a negative result within the first hours     of the onset of symptoms does not rule out     myocardial infarction with certainty.     If myocardial infarction is still suspected,     repeat the test at appropriate intervals.  TROPONIN I     Status: None   Collection Time    06/11/13  7:45 PM      Result Value Range   Troponin I <0.30  <0.30 ng/mL   Comment:            Due to the release kinetics of cTnI,     a negative result within the first hours     of the onset of symptoms does not rule out     myocardial infarction with certainty.     If myocardial infarction is still suspected,     repeat the test at appropriate intervals.  CBC     Status: Abnormal   Collection Time    06/12/13  5:00 AM      Result Value Range   WBC 4.4  4.0 - 10.5 K/uL   RBC 3.77 (*) 3.87 - 5.11 MIL/uL   Hemoglobin 12.2  12.0 - 15.0 g/dL   HCT 16.1  09.6 - 04.5 %   MCV 95.5  78.0 - 100.0 fL   MCH 32.4  26.0 - 34.0 pg   MCHC 33.9  30.0 - 36.0 g/dL   RDW 40.9 (*) 81.1 - 91.4 %   Platelets 54 (*) 150 - 400 K/uL   Comment: CONSISTENT WITH PREVIOUS RESULT  GLUCOSE, CAPILLARY     Status: None   Collection Time    06/12/13  7:50 AM      Result Value Range   Glucose-Capillary 86  70 - 99 mg/dL    Studies/Results: Ct Abdomen Pelvis W Contrast  06/10/2013   CLINICAL DATA:  Nausea, emesis  EXAM: CT ABDOMEN AND PELVIS WITH CONTRAST  TECHNIQUE: Multidetector CT imaging of the abdomen and pelvis was performed using the standard protocol following bolus administration of intravenous contrast.  CONTRAST:  OMNIPAQUE IOHEXOL 300 MG/ML  SOLN  COMPARISON:  Abdominal ultrasound 05/21/2013  FINDINGS: Lower Chest: The lung bases are clear. Visualized cardiac structures are within normal limits for size. No pericardial effusion. Unremarkable visualized distal thoracic esophagus.  Atherosclerotic vascular calcifications noted in the coronary arteries.  Abdomen: Unremarkable CT appearance of the stomach, duodenum, spleen, adrenal glands pancreas and liver. Cholelithiasis. Gallbladder is slightly contracted. Diffuse mild gallbladder wall thickening measuring 4- 5 mm.  Unremarkable appearance of the bilateral kidneys. No focal solid lesion, hydronephrosis or nephrolithiasis. Subcentimeter hypoattenuating cortical lesions bilaterally are too small for accurate characterization but are statistically highly likely benign cysts.  Diffuse mild submucosal edema of the descending colon beginning at the splenic flexure and extending throughout the sigmoid colon to the rectum. There is mild interstitial stranding in the associated descending and sigmoid mesial colon as well as hypervascularity. Findings are consistent with colitis. No free fluid or free air. Normal appendix in the right lower quadrant.  Pelvis: Large abnormal heterogeneously enhancing soft tissue mass arising from the left ovary/adnexa measures approximately 10 x 8.9 7.0 cm. As the mass is also in close approximation with the uterus and could represent a pedunculated uterine fibroid. The right ovary is unremarkable. No free fluid or suspicious adenopathy.  Bones/Soft Tissues: No acute fracture or aggressive appearing lytic or blastic osseous lesion.  Vascular: Scattered atherosclerotic vascular calcifications without aneurysmal dilatation or significant appearing stenosis.  IMPRESSION: 1. Long segment colitis extending from the colon through the splenic flexure. Differential considerations include both infectious and inflammatory (ulcerative colitis) etiologies. Given the distribution and relative paucity of atherosclerotic vascular disease, ischemic colitis is considered significantly less likely. 2. 10.4 cm soft tissue mass in the pelvis intimately associated with both the left adnexa and the inferior uterine body. Differential  considerations include a large exophytic/pedunculated uterine fibroid and solid ovarian neoplasm. Recommend further evaluation with non emergent MRI of the pelvis with and without contrast as well as referral to gynecologic surgery for followup. 3. Atherosclerosis including coronary artery disease. Recommend assessment of coronary risk factors and consideration of medical therapy. 4. Cholelithiasis with diffuse mild gallbladder wall thickening. Findings suggest chronic cholecystitis. If there is clinical concern for acute cholecystitis, right upper quadrant ultrasound could further evaluate.   Electronically Signed   By: Malachy Moan M.D.   On: 06/10/2013 20:57   Medications: Scheduled Meds: . aspirin  81 mg Oral Daily  . enoxaparin (LOVENOX) injection  70 mg Subcutaneous Q12H  . folic acid  1 mg Oral Daily  . metoprolol tartrate  12.5 mg Oral BID  . metronidazole  500 mg Intravenous Q8H  . multivitamin with minerals  1 tablet Oral Daily  . nicotine  14 mg Transdermal Daily  . pantoprazole  40 mg Oral Daily  . thiamine  100 mg Oral Daily   Or  . thiamine  100 mg Intravenous Daily   Continuous Infusions: . 0.9 % NaCl with KCl 40 mEq / L 100 mL/hr at 06/12/13 0516   PRN Meds:.alum & mag hydroxide-simeth, LORazepam, LORazepam  Assessment/Plan: Principal Problem:   NSTEMI (non-ST elevated myocardial infarction) Active Problems:   GERD (gastroesophageal reflux disease)   Alcohol  dependence   Colitis   Prolonged Q-T interval on ECG   Adnexal mass   Cholelithiasis  Susan Francis is a 57 year old female with PMH of Hep C, GERD, PUD, and chronic tobacco dependence admitted for abdominal pain and gastroenteritis.  NSTEMI--non-specific st changes on initial EKG and troponin x1 positive 0.33.  Subsequent troponins are negative. No current chest pain.  2-D echo without wall motion abnormality.  Quite possibly no acute cardiac issue.  She is a smoker -appreciate cardiology following: Dr. Shirlee Latch.   Continue aspirin and beta blocker therapy-echo  -Lovenox but monitor platelets  -On metoprolol 12.5mg  BID, patient denies any recent cocaine use   #Gastroenteritis and colitis--improved today. Admitted for nausea/vomiting and abdominal pain x1 day. Hx of chronic alcohol dependence and recent binge drinking. CT abdomen and pelvis with contrast on admission showed long segment colitis extending from colon through splenic flexure (infectious vs. Inflammatory), Cholelithiasis (down trending AST). GI symptoms temporally related to recent EtOH binge but given significance of CT findings, treating for infectious colitis with antibiotics and will monitor for improvement. Surgery, Dr. Renaldo Fiddler did see on admission, do not think related to GB related and unclear of infectious colitis. Will likely need GI follow-up for colonoscopy once colitis has improved and it symptomatically has.  Continue Flagyl and plan to change to by mouth tomorrow  -advance diet as tolerated   -trend electrolytes  -consider GI follow up per surgery  Hypokalemia and Hypomagnesemia - resolved  Prolonged QTc - repeat EKG today -continue to monitor qtc  #GERD-on Nexium 40mg  at home but she has not been taking. ? Hx of PUD per chart review. Presented with burning chest pain and abdominal pain after recent alcohol binge and vomiting.  -continue PPI  -consider GI evaluation (inpatient vs. outpatient)  #EtOH abuse--hx of drinking heavily for past 5 years, sought inpatient treatment for alcohol abuse in 2012 but continues to relapse, often drinking 1 pint of bourbon/day. She does not appear to be withdrawing at this time but was in withdrawal time window and does have history of alcohol withdrawal seizures. She is interested in repeat attempts at alcohol cessation. Unfortunately has findings suggestive of liver disease: Platelets 60 (baseline 120) on admission labs, AST elevated at 64 now trending down with ALT 29 (2:1 ratio consistent with  alcohol abuse). Of note, bilirubin also elevated at 3.5, CT abdomen showed cholelithiasis with mild gallbladder wall thickening that appears to be chronic cholecystitis.  -thiamine 100 mg daily, folic acid 1 mg daily, multivitamin  -CIWA protocol  -Appreciate social work consult (poor social situation and pt desires resources for EtOH day counseling)  #Uterine mass- CT abdomen showed 10 cm soft tissue mass in pelvis associated with left adnexa and inferior uterine body; differential per radiology includes pedunculated uterine fibroid vs. solid ovarian neoplasm. Pt states she went through menopause years ago and denies any vaginal bleeding/spotting but does endorse hx of fibroids. Per radiology, recommend non emergent MRI of pelvis with and without contrast and outpatient gynecology and surgery follow-up if needed.  Plan for MRI prior to discharge #History of hepatitis C- biopsy proven; will check hepatitis panel for immunity against hepatitis A/B; if not immune, pt would be candidate for vaccination. Likely cirrhosis with chronic alcohol use as well. She could also have quantitative hepatitis C studies as outpatient to see if active infection; she may be candidate for treatment if she is able to stop drinking.  -hepatitis panel, HIV antibody pending  -INR 1.24  #HTN-  blood pressure controlled continue BB in setting of ?NSTEMI, metoprolol 12.5mg  bid   Disposition is deferred at this time, awaiting improvement of current medical problems.  The patient does not have transportation limitations that hinder transportation to clinic appointments.    LOS: 2 days   Pearla Dubonnet, MD 06/12/2013, 11:30 AM

## 2013-06-12 NOTE — Progress Notes (Signed)
ANTICOAGULATION CONSULT NOTE - follow up  Pharmacy Consult for Lovenox Indication: vte px   Allergies  Allergen Reactions  . Adhesive [Tape] Other (See Comments)    Thin skin  . Oxycodone Hcl Hives    Patient Measurements: Height: 5\' 2"  (157.5 cm) Weight: 145 lb 8.1 oz (66 kg) IBW/kg (Calculated) : 50.1   Vital Signs: Temp: 98.3 F (36.8 C) (10/11 0506) Temp src: Oral (10/11 0506) BP: 114/70 mmHg (10/11 0506) Pulse Rate: 54 (10/11 0506)  Labs:  Recent Labs  06/10/13 1644 06/11/13 0546 06/11/13 1439 06/11/13 1945 06/12/13 0500  HGB 15.6* 13.7  --   --  12.2  HCT 44.2 39.1  --   --  36.0  PLT 60* 65*  --   --  54*  LABPROT  --  15.3*  --   --   --   INR  --  1.24  --   --   --   CREATININE 0.61 0.72  --   --   --   TROPONINI  --  0.33* <0.30 <0.30  --     Estimated Creatinine Clearance: 67.5 ml/min (by C-G formula based on Cr of 0.72).   Assessment: 57yo female to transition from full dose LMWH to vte px dose coumadin.  Pt with history of EtOH and Hep C.  Baseline Pltc 65 which is stable compared to admission pltc with no signs of bleeding.  MD aware of pltc.  BL INR 1.24, on no Coumadin pta.  PLCT 54 today.  No bleeding reported.    Goal of Therapy:  Monitor platelets by anticoagulation protocol: Yes   Plan:  1.   DC Lovenox 70mg  SQ q12 2.  Start LMWH 30 mg sq q24 (0.5 mg/kg) 10/12 at 10am 3. Cbc q48 hrs Herby Abraham, Pharm.D. [phone] 06/12/2013 12:14 PM

## 2013-06-12 NOTE — Progress Notes (Signed)
SUBJECTIVE:  No chest pain. Troponins normalized after minimally elevated first reading.  OBJECTIVE:   Vitals:   Filed Vitals:   06/11/13 1226 06/11/13 1409 06/11/13 2047 06/12/13 0506  BP: 90/60 93/59 99/62  114/70  Pulse: 67 72 70 54  Temp:  98.9 F (37.2 C) 99 F (37.2 C) 98.3 F (36.8 C)  TempSrc:  Oral Oral Oral  Resp:  20 18 20   Height:      Weight:      SpO2:  96% 96% 97%   I&O's:   Intake/Output Summary (Last 24 hours) at 06/12/13 1202 Last data filed at 06/12/13 0015  Gross per 24 hour  Intake    120 ml  Output      0 ml  Net    120 ml   TELEMETRY: Reviewed telemetry pt in normal sinus rhythm:     PHYSICAL EXAM General: Well developed, well nourished, in no acute distress Head:   Normal cephalic and atramatic  Lungs:   Clear bilaterally to auscultation and percussion. Heart:  HRRR S1 S2  Abdomen: Bowel sounds are positive, abdomen soft and non-tender without masses or                  Hernia's noted. Msk:   Normal strength and tone for age. Extremities:  No edema.   Neuro: Alert and oriented X 3. Psych:  Normal affect, responds appropriately   LABS: Basic Metabolic Panel:  Recent Labs  40/98/11 1644 06/11/13 06/11/13 0546 06/11/13 1239  NA 136  --  134*  --   K 2.8*  --  3.3*  --   CL 93*  --  97  --   CO2 21  --  26  --   GLUCOSE 159*  --  90  --   BUN <3*  --  <3*  --   CREATININE 0.61  --  0.72  --   CALCIUM 9.3  --  8.8  --   MG  --  1.4*  --  2.7*   Liver Function Tests:  Recent Labs  06/10/13 1644 06/11/13 0546  AST 64* 46*  ALT 29 22  ALKPHOS 93 80  BILITOT 3.5* 2.0*  PROT 7.4 6.6  ALBUMIN 3.9 3.5    Recent Labs  06/10/13 1644  LIPASE 38   CBC:  Recent Labs  06/11/13 0546 06/12/13 0500  WBC 6.2 4.4  HGB 13.7 12.2  HCT 39.1 36.0  MCV 91.4 95.5  PLT 65* 54*   Cardiac Enzymes:  Recent Labs  06/11/13 0546 06/11/13 1439 06/11/13 1945  TROPONINI 0.33* <0.30 <0.30   BNP: No components found with this  basename: POCBNP,  D-Dimer: No results found for this basename: DDIMER,  in the last 72 hours Hemoglobin A1C: No results found for this basename: HGBA1C,  in the last 72 hours Fasting Lipid Panel: No results found for this basename: CHOL, HDL, LDLCALC, TRIG, CHOLHDL, LDLDIRECT,  in the last 72 hours Thyroid Function Tests: No results found for this basename: TSH, T4TOTAL, FREET3, T3FREE, THYROIDAB,  in the last 72 hours Anemia Panel: No results found for this basename: VITAMINB12, FOLATE, FERRITIN, TIBC, IRON, RETICCTPCT,  in the last 72 hours Coag Panel:   Lab Results  Component Value Date   INR 1.24 06/11/2013    RADIOLOGY: US Abdomen Complete  05/21/2013   CLINICAL DATA:  Hepatitis-C  EXAM: ABDOMEN ULTRASOUND  COMPARISON:  Acute abdominal series 10/21/2011.  FINDINGS: Gallbladder  Small movable gallstones are noted. There is  no gallbladder wall thickening or pericholecystic fluid. Sonographic Murphy's sign is absent.  Common bile duct  Diameter: 2 mm. No intraductal calculi demonstrated.  Liver  The hepatic echogenicity is mildly increased. The hepatic contours appear within normal limits. No focal lesions are identified.  IVC  No abnormality visualized.  Pancreas  The visualized portions of the pancreas appear unremarkable. The pancreatic tail is partly obscured by bowel gas.  Spleen  Size and appearance within normal limits.  Right Kidney  Length: 11.4 cm. There is a tiny cyst in the upper pole measuring 11 mm maximally. No other focal lesions are identified. The renal cortical echogenicity and thickness are normal.  Left Kidney  Length: 11.8 cm. Echogenicity within normal limits. No mass or hydronephrosis visualized.  Abdominal aorta  No aneurysm visualized.  IMPRESSION: 1. Cholelithiasis without evidence of cholecystitis or biliary dilatation. 2. No focal hepatic abnormalities identified.   Electronically Signed   By: Roxy Horseman   On: 05/21/2013 08:37   Ct Abdomen Pelvis W  Contrast  06/10/2013   CLINICAL DATA:  Nausea, emesis  EXAM: CT ABDOMEN AND PELVIS WITH CONTRAST  TECHNIQUE: Multidetector CT imaging of the abdomen and pelvis was performed using the standard protocol following bolus administration of intravenous contrast.  CONTRAST:  OMNIPAQUE IOHEXOL 300 MG/ML  SOLN  COMPARISON:  Abdominal ultrasound 05/21/2013  FINDINGS: Lower Chest: The lung bases are clear. Visualized cardiac structures are within normal limits for size. No pericardial effusion. Unremarkable visualized distal thoracic esophagus. Atherosclerotic vascular calcifications noted in the coronary arteries.  Abdomen: Unremarkable CT appearance of the stomach, duodenum, spleen, adrenal glands pancreas and liver. Cholelithiasis. Gallbladder is slightly contracted. Diffuse mild gallbladder wall thickening measuring 4- 5 mm.  Unremarkable appearance of the bilateral kidneys. No focal solid lesion, hydronephrosis or nephrolithiasis. Subcentimeter hypoattenuating cortical lesions bilaterally are too small for accurate characterization but are statistically highly likely benign cysts.  Diffuse mild submucosal edema of the descending colon beginning at the splenic flexure and extending throughout the sigmoid colon to the rectum. There is mild interstitial stranding in the associated descending and sigmoid mesial colon as well as hypervascularity. Findings are consistent with colitis. No free fluid or free air. Normal appendix in the right lower quadrant.  Pelvis: Large abnormal heterogeneously enhancing soft tissue mass arising from the left ovary/adnexa measures approximately 10 x 8.9 7.0 cm. As the mass is also in close approximation with the uterus and could represent a pedunculated uterine fibroid. The right ovary is unremarkable. No free fluid or suspicious adenopathy.  Bones/Soft Tissues: No acute fracture or aggressive appearing lytic or blastic osseous lesion.  Vascular: Scattered atherosclerotic vascular  calcifications without aneurysmal dilatation or significant appearing stenosis.  IMPRESSION: 1. Long segment colitis extending from the colon through the splenic flexure. Differential considerations include both infectious and inflammatory (ulcerative colitis) etiologies. Given the distribution and relative paucity of atherosclerotic vascular disease, ischemic colitis is considered significantly less likely. 2. 10.4 cm soft tissue mass in the pelvis intimately associated with both the left adnexa and the inferior uterine body. Differential considerations include a large exophytic/pedunculated uterine fibroid and solid ovarian neoplasm. Recommend further evaluation with non emergent MRI of the pelvis with and without contrast as well as referral to gynecologic surgery for followup. 3. Atherosclerosis including coronary artery disease. Recommend assessment of coronary risk factors and consideration of medical therapy. 4. Cholelithiasis with diffuse mild gallbladder wall thickening. Findings suggest chronic cholecystitis. If there is clinical concern for acute cholecystitis, right upper quadrant  ultrasound could further evaluate.   Electronically Signed   By: Malachy Moan M.D.   On: 06/10/2013 20:57      ASSESSMENT: Single elevated troponin, prolonged QT, hypokalemia  PLAN:  Troponin is normalized. I do not think she has had any kind of plaque rupture event. Would stop anticoagulation. Would not use aspirin in this patient with suspected liver disease. I think the initial troponin may have just been a spurious lab value. If the troponin is truly elevated, and should stay elevated for a few weeks.  Echo showed normal wall motion. Normal LV function. Would not pursue any ischemia workup at this time. Continue to treat other medical issues. If she has further symptoms, could consider stress testing.  Replace potassium. Check magnesium. Follow electrocardiogram.  Corky Crafts., MD  06/12/2013   12:02 PM

## 2013-06-12 NOTE — ED Provider Notes (Signed)
Medical screening examination/treatment/procedure(s) were conducted as a shared visit with resident physician and myself.  I personally evaluated the patient during the encounter.   I interviewed and examined the patient. Lungs are CTAB. Cardiac exam wnl. Abdomen soft w/ mild distension and diffuse non-focal ttp. CT w/ several findings including chr cholecystitis, pelvic mass, colitis. Will admit.     Junius Argyle, MD 06/12/13 1058

## 2013-06-13 LAB — CBC WITH DIFFERENTIAL/PLATELET
Basophils Absolute: 0 10*3/uL (ref 0.0–0.1)
Basophils Relative: 1 % (ref 0–1)
Eosinophils Absolute: 0.1 10*3/uL (ref 0.0–0.7)
Eosinophils Relative: 2 % (ref 0–5)
Lymphocytes Relative: 37 % (ref 12–46)
MCHC: 34.7 g/dL (ref 30.0–36.0)
MCV: 94.2 fL (ref 78.0–100.0)
Monocytes Absolute: 0.2 10*3/uL (ref 0.1–1.0)
Neutro Abs: 2.3 10*3/uL (ref 1.7–7.7)
Platelets: 51 10*3/uL — ABNORMAL LOW (ref 150–400)
RDW: 15.3 % (ref 11.5–15.5)
WBC: 4.2 10*3/uL (ref 4.0–10.5)

## 2013-06-13 LAB — COMPREHENSIVE METABOLIC PANEL
ALT: 23 U/L (ref 0–35)
AST: 56 U/L — ABNORMAL HIGH (ref 0–37)
Albumin: 3.2 g/dL — ABNORMAL LOW (ref 3.5–5.2)
BUN: 5 mg/dL — ABNORMAL LOW (ref 6–23)
CO2: 20 mEq/L (ref 19–32)
Calcium: 8.5 mg/dL (ref 8.4–10.5)
Chloride: 99 mEq/L (ref 96–112)
Creatinine, Ser: 0.81 mg/dL (ref 0.50–1.10)
GFR calc Af Amer: >90 mL/min (ref >90)
GFR calc non Af Amer: 78 mL/min — ABNORMAL LOW (ref >90)
Sodium: 132 mEq/L — ABNORMAL LOW (ref 135–145)
Total Protein: 6.1 g/dL (ref 6.0–8.3)

## 2013-06-13 LAB — MAGNESIUM: Magnesium: 2.1 mg/dL (ref 1.5–2.5)

## 2013-06-13 MED ORDER — METRONIDAZOLE 500 MG PO TABS
500.0000 mg | ORAL_TABLET | Freq: Three times a day (TID) | ORAL | Status: DC
Start: 2013-06-13 — End: 2013-06-13
  Administered 2013-06-13: 500 mg via ORAL
  Filled 2013-06-13 (×3): qty 1

## 2013-06-13 MED ORDER — METRONIDAZOLE 500 MG PO TABS: 500.0000 mg | ORAL_TABLET | Freq: Two times a day (BID) | ORAL | Status: AC

## 2013-06-13 MED ORDER — THIAMINE HCL 100 MG PO TABS: 100.0000 mg | ORAL_TABLET | Freq: Every day | ORAL | Status: DC

## 2013-06-13 NOTE — Progress Notes (Signed)
Pt ambulated in hallway about 163ft.  Pt tolerated well.  Denied any complaints of dizziness.  Gait steady.

## 2013-06-13 NOTE — Discharge Summary (Signed)
Physician Discharge Summary  NAME:Susan Francis  BMW:[id number]  DOB: September 04, 1955   Admit date: 06/10/2013 Discharge date: 06/13/2013  Discharge Diagnoses:  Principal Problem:   Troponin level elevated Active Problems:   GERD (gastroesophageal reflux disease)   Alcohol dependence   Uterine mass   Colitis   Prolonged Q-T interval on ECG   Adnexal mass   Cholelithiasis   Elevated TSH   Discharge Physical Exam:  General Appearance: Alert, cooperative, no distress, appears stated age  Weight change:   Intake/Output Summary (Last 24 hours) at 06/13/13 1240 Last data filed at 06/13/13 0700  Gross per 24 hour  Intake   1540 ml  Output      0 ml  Net   1540 ml   Filed Vitals:   06/12/13 2105 06/12/13 2154 06/13/13 0603 06/13/13 0959  BP: 99/62 100/60 109/73 108/71  Pulse: 61 68 60 57  Temp: 98.3 F (36.8 C)  98.7 F (37.1 C)   TempSrc: Oral  Oral   Resp: 17  17   Height:      Weight:      SpO2: 96%  92%    General Appearance: Alert, cooperative, no distress, appears stated age.  Mood has been upbeat during the entire hospitalization  Lungs: Clear to auscultation bilaterally, respirations unlabored  Heart: Regular rate and rhythm, S1 and S2 normal, no murmur, rub or gallop  Abdomen: Soft, non-tender, bowel sounds active all four quadrants, no masses, no organomegaly  Extremities: Extremities normal, atraumatic, no cyanosis or edema  Neuro: Alert and oriented, nonfocal    Discharge Condition: Much improved  Hospital Course: Mrs. Susan Francis is a 57 year old female with history of alcohol abuse in the past and chronic hepatitis C.  She also has GERD and hypertension.  She presented with abdominal pain and nausea and vomiting in the early morning hours of October 10.  She was having loose stools.  She had been on somewhat of a alcoholic binge for 2 days after not drinking for 5 days and on the next day she started vomiting.  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   She continues to smoke tobacco and occasional marijuana.  She was found on evaluation in the emergency room to have colitis rather diffusely from the splenic flexure down to the rectum.  She was also found to have a uterine mass but reports a history of fibroids.  She has not seen a gynecologist lately but has not complained of pelvic pain at discharge. When admitted she was found to have an elevated troponin at 0.33, just borderline elevated and subsequent troponins were normal and she had resolution of chest pain with treatment of GERD.  She was also noted to have cholelithiasis with possible chronic cholecystitis but she has no right upper quadrant pain and treatment of her colitis has resolved all symptoms. She was noted to have a slightly low prolonged QT and citalopram was discontinued.  She may need to take some other form of antidepressant when seen in followup as an outpatient. Over the weekend she has felt great and is eating well and we will ambulate the patient and discontinue IV metronidazole and give her a five-day course of twice a day metronidazole to complete as an outpatient. She should follow up with Dr. Theressa Millard in one week and notify us in the meantime if she develops chest pain or recurrent abdominal pain or diarrhea.  She is currently eliciting no symptoms of alcohol withdrawal and  feels very well. While hospitalized was consulted on by cardiology because of her prolonged QT syndrome which essentially was asymptomatic. Repeat EKG as an outpatient should be performed with possible further cardiac workup to rule out ischemic heart disease because of the borderline positive troponin on admission.  Things to follow up in the outpatient setting: Uterine mass/prolonged QT/history of depression/descending colitis/history of alcohol abuse/history of tobacco abuse/gallstones  Consults: Treatment Team:  Rounding [facility], MD  Disposition: 06-Home-Health Care Svc  Discharge Orders    Future Orders Complete By Expires   Call MD for:  difficulty breathing, headache or visual disturbances  As directed    Call MD for:  persistant nausea and vomiting  As directed    Call MD for:  severe uncontrolled pain  As directed    Call MD for:  temperature >100.4  As directed    Diet general  As directed    Increase activity slowly  As directed        Medication List    STOP taking these medications       citalopram 40 MG tablet  Commonly known as:  CELEXA      TAKE these medications       ALPRAZolam 0.5 MG tablet  Commonly known as:  XANAX  Take 0.5 mg by mouth 3 (three) times daily as needed for sleep or anxiety.     esomeprazole 40 MG capsule  Commonly known as:  NEXIUM  Take 40 mg by mouth daily before breakfast.     metroNIDAZOLE 500 MG tablet  Commonly known as:  FLAGYL  Take 1 tablet (500 mg total) by mouth 2 (two) times daily.  Start taking on:  06/14/2013     potassium chloride 10 MEQ tablet  Commonly known as:  K-DUR  Take 10 mEq by mouth daily.     thiamine 100 MG tablet  Take 1 tablet (100 mg total) by mouth daily.           Follow-up Information   Follow up with [facility]    . (Follow up with your doctor as scheduled next week to discuss further workup for your chest pain. )    Contact information:   [address] [place] [address]       Schedule an appointment as soon as possible for a visit with [facility].   Contact information:   [address] [place] [address] [phone]      The results of significant diagnostics from this hospitalization (including imaging, microbiology, ancillary and laboratory) are listed below for reference.    Significant Diagnostic Studies: US Abdomen Complete  05/21/2013   CLINICAL DATA:  Hepatitis-C  EXAM: ABDOMEN ULTRASOUND  COMPARISON:  Acute abdominal series 10/21/2011.  FINDINGS: Gallbladder  Small movable gallstones are  noted. There is no gallbladder wall thickening or pericholecystic fluid. Sonographic Murphy's sign is absent.  Common bile duct  Diameter: 2 mm. No intraductal calculi demonstrated.  Liver  The hepatic echogenicity is mildly increased. The hepatic contours appear within normal limits. No focal lesions are identified.  IVC  No abnormality visualized.  Pancreas  The visualized portions of the pancreas appear unremarkable. The pancreatic tail is partly obscured by bowel gas.  Spleen  Size and appearance within normal limits.  Right Kidney  Length: 11.4 cm. There is a tiny cyst in the upper pole measuring 11 mm maximally. No other focal lesions are identified. The renal cortical echogenicity and  thickness are normal.  Left Kidney  Length: 11.8 cm. Echogenicity within normal limits. No mass or hydronephrosis visualized.  Abdominal aorta  No aneurysm visualized.  IMPRESSION: 1. Cholelithiasis without evidence of cholecystitis or biliary dilatation. 2. No focal hepatic abnormalities identified.   Electronically Signed   By: Roxy Horseman   On: 05/21/2013 08:37   Ct Abdomen Pelvis W Contrast  06/10/2013   CLINICAL DATA:  Nausea, emesis  EXAM: CT ABDOMEN AND PELVIS WITH CONTRAST  TECHNIQUE: Multidetector CT imaging of the abdomen and pelvis was performed using the standard protocol following bolus administration of intravenous contrast.  CONTRAST:  OMNIPAQUE IOHEXOL 300 MG/ML  SOLN  COMPARISON:  Abdominal ultrasound 05/21/2013  FINDINGS: Lower Chest: The lung bases are clear. Visualized cardiac structures are within normal limits for size. No pericardial effusion. Unremarkable visualized distal thoracic esophagus. Atherosclerotic vascular calcifications noted in the coronary arteries.  Abdomen: Unremarkable CT appearance of the stomach, duodenum, spleen, adrenal glands pancreas and liver. Cholelithiasis. Gallbladder is slightly contracted. Diffuse mild gallbladder wall thickening measuring 4- 5 mm.  Unremarkable  appearance of the bilateral kidneys. No focal solid lesion, hydronephrosis or nephrolithiasis. Subcentimeter hypoattenuating cortical lesions bilaterally are too small for accurate characterization but are statistically highly likely benign cysts.  Diffuse mild submucosal edema of the descending colon beginning at the splenic flexure and extending throughout the sigmoid colon to the rectum. There is mild interstitial stranding in the associated descending and sigmoid mesial colon as well as hypervascularity. Findings are consistent with colitis. No free fluid or free air. Normal appendix in the right lower quadrant.  Pelvis: Large abnormal heterogeneously enhancing soft tissue mass arising from the left ovary/adnexa measures approximately 10 x 8.9 7.0 cm. As the mass is also in close approximation with the uterus and could represent a pedunculated uterine fibroid. The right ovary is unremarkable. No free fluid or suspicious adenopathy.  Bones/Soft Tissues: No acute fracture or aggressive appearing lytic or blastic osseous lesion.  Vascular: Scattered atherosclerotic vascular calcifications without aneurysmal dilatation or significant appearing stenosis.  IMPRESSION: 1. Long segment colitis extending from the colon through the splenic flexure. Differential considerations include both infectious and inflammatory (ulcerative colitis) etiologies. Given the distribution and relative paucity of atherosclerotic vascular disease, ischemic colitis is considered significantly less likely. 2. 10.4 cm soft tissue mass in the pelvis intimately associated with both the left adnexa and the inferior uterine body. Differential considerations include a large exophytic/pedunculated uterine fibroid and solid ovarian neoplasm. Recommend further evaluation with non emergent MRI of the pelvis with and without contrast as well as referral to gynecologic surgery for followup. 3. Atherosclerosis including coronary artery disease. Recommend  assessment of coronary risk factors and consideration of medical therapy. 4. Cholelithiasis with diffuse mild gallbladder wall thickening. Findings suggest chronic cholecystitis. If there is clinical concern for acute cholecystitis, right upper quadrant ultrasound could further evaluate.   Electronically Signed   By: Malachy Moan M.D.   On: 06/10/2013 20:57    Microbiology: No results found for this or any previous visit (from the past 240 hour(s)).   Labs: Results for orders placed during the hospital encounter of 06/10/13  COMPREHENSIVE METABOLIC PANEL      Result Value Range   Sodium 136  135 - 145 mEq/L   Potassium 2.8 (*) 3.5 - 5.1 mEq/L   Chloride 93 (*) 96 - 112 mEq/L   CO2 21  19 - 32 mEq/L   Glucose, Bld 159 (*) 70 - 99 mg/dL  BUN <3 (*) 6 - 23 mg/dL   Creatinine, Ser 0.45  0.50 - 1.10 mg/dL   Calcium 9.3  8.4 - 40.9 mg/dL   Total Protein 7.4  6.0 - 8.3 g/dL   Albumin 3.9  3.5 - 5.2 g/dL   AST 64 (*) 0 - 37 U/L   ALT 29  0 - 35 U/L   Alkaline Phosphatase 93  39 - 117 U/L   Total Bilirubin 3.5 (*) 0.3 - 1.2 mg/dL   GFR calc non Af Amer >90  >90 mL/min   GFR calc Af Amer >90  >90 mL/min  CBC      Result Value Range   WBC 4.6  4.0 - 10.5 K/uL   RBC 4.85  3.87 - 5.11 MIL/uL   Hemoglobin 15.6 (*) 12.0 - 15.0 g/dL   HCT 81.1  91.4 - 78.2 %   MCV 91.1  78.0 - 100.0 fL   MCH 32.2  26.0 - 34.0 pg   MCHC 35.3  30.0 - 36.0 g/dL   RDW 95.6  21.3 - 08.6 %   Platelets 60 (*) 150 - 400 K/uL  URINALYSIS, ROUTINE W REFLEX MICROSCOPIC      Result Value Range   Color, Urine AMBER (*) YELLOW   APPearance HAZY (*) CLEAR   Specific Gravity, Urine 1.013  1.005 - 1.030   pH 6.5  5.0 - 8.0   Glucose, UA 100 (*) NEGATIVE mg/dL   Hgb urine dipstick NEGATIVE  NEGATIVE   Bilirubin Urine NEGATIVE  NEGATIVE   Ketones, ur 15 (*) NEGATIVE mg/dL   Protein, ur 30 (*) NEGATIVE mg/dL   Urobilinogen, UA 2.0 (*) 0.0 - 1.0 mg/dL   Nitrite NEGATIVE  NEGATIVE   Leukocytes, UA NEGATIVE   NEGATIVE  LIPASE, BLOOD      Result Value Range   Lipase 38  11 - 59 U/L  URINE MICROSCOPIC-ADD ON      Result Value Range   Squamous Epithelial / LPF MANY (*) RARE   WBC, UA 0-2  <3 WBC/hpf   RBC / HPF 0-2  <3 RBC/hpf   Bacteria, UA FEW (*) RARE   Casts HYALINE CASTS (*) NEGATIVE   Urine-Other MUCOUS PRESENT    MAGNESIUM      Result Value Range   Magnesium 1.4 (*) 1.5 - 2.5 mg/dL  PROTIME-INR      Result Value Range   Prothrombin Time 15.3 (*) 11.6 - 15.2 seconds   INR 1.24  0.00 - 1.49  COMPREHENSIVE METABOLIC PANEL      Result Value Range   Sodium 134 (*) 135 - 145 mEq/L   Potassium 3.3 (*) 3.5 - 5.1 mEq/L   Chloride 97  96 - 112 mEq/L   CO2 26  19 - 32 mEq/L   Glucose, Bld 90  70 - 99 mg/dL   BUN <3 (*) 6 - 23 mg/dL   Creatinine, Ser 5.78  0.50 - 1.10 mg/dL   Calcium 8.8  8.4 - 46.9 mg/dL   Total Protein 6.6  6.0 - 8.3 g/dL   Albumin 3.5  3.5 - 5.2 g/dL   AST 46 (*) 0 - 37 U/L   ALT 22  0 - 35 U/L   Alkaline Phosphatase 80  39 - 117 U/L   Total Bilirubin 2.0 (*) 0.3 - 1.2 mg/dL   GFR calc non Af Amer >90  >90 mL/min   GFR calc Af Amer >90  >90 mL/min  CBC  Result Value Range   WBC 6.2  4.0 - 10.5 K/uL   RBC 4.28  3.87 - 5.11 MIL/uL   Hemoglobin 13.7  12.0 - 15.0 g/dL   HCT 16.1  09.6 - 04.5 %   MCV 91.4  78.0 - 100.0 fL   MCH 32.0  26.0 - 34.0 pg   MCHC 35.0  30.0 - 36.0 g/dL   RDW 40.9  81.1 - 91.4 %   Platelets 65 (*) 150 - 400 K/uL  HEPATITIS PANEL, ACUTE      Result Value Range   Hepatitis B Surface Ag NEGATIVE  NEGATIVE   HCV Ab Reactive (*) NEGATIVE   Hep A IgM NEGATIVE  NEGATIVE   Hep B C IgM NEGATIVE  NEGATIVE  HIV ANTIBODY (ROUTINE TESTING)      Result Value Range   HIV NON REACTIVE  NON REACTIVE  TROPONIN I      Result Value Range   Troponin I <0.30  <0.30 ng/mL  TROPONIN I      Result Value Range   Troponin I <0.30  <0.30 ng/mL  TROPONIN I      Result Value Range   Troponin I 0.33 (*) <0.30 ng/mL  MAGNESIUM      Result Value Range    Magnesium 2.7 (*) 1.5 - 2.5 mg/dL  CBC      Result Value Range   WBC 4.4  4.0 - 10.5 K/uL   RBC 3.77 (*) 3.87 - 5.11 MIL/uL   Hemoglobin 12.2  12.0 - 15.0 g/dL   HCT 78.2  95.6 - 21.3 %   MCV 95.5  78.0 - 100.0 fL   MCH 32.4  26.0 - 34.0 pg   MCHC 33.9  30.0 - 36.0 g/dL   RDW 08.6 (*) 57.8 - 46.9 %   Platelets 54 (*) 150 - 400 K/uL  GLUCOSE, CAPILLARY      Result Value Range   Glucose-Capillary 86  70 - 99 mg/dL  MAGNESIUM      Result Value Range   Magnesium 2.1  1.5 - 2.5 mg/dL  CBC WITH DIFFERENTIAL      Result Value Range   WBC 4.2  4.0 - 10.5 K/uL   RBC 3.98  3.87 - 5.11 MIL/uL   Hemoglobin 13.0  12.0 - 15.0 g/dL   HCT 62.9  52.8 - 41.3 %   MCV 94.2  78.0 - 100.0 fL   MCH 32.7  26.0 - 34.0 pg   MCHC 34.7  30.0 - 36.0 g/dL   RDW 24.4  01.0 - 27.2 %   Platelets 51 (*) 150 - 400 K/uL   Neutrophils Relative % 54  43 - 77 %   Neutro Abs 2.3  1.7 - 7.7 K/uL   Lymphocytes Relative 37  12 - 46 %   Lymphs Abs 1.6  0.7 - 4.0 K/uL   Monocytes Relative 6  3 - 12 %   Monocytes Absolute 0.2  0.1 - 1.0 K/uL   Eosinophils Relative 2  0 - 5 %   Eosinophils Absolute 0.1  0.0 - 0.7 K/uL   Basophils Relative 1  0 - 1 %   Basophils Absolute 0.0  0.0 - 0.1 K/uL  COMPREHENSIVE METABOLIC PANEL      Result Value Range   Sodium 132 (*) 135 - 145 mEq/L   Potassium 4.4  3.5 - 5.1 mEq/L   Chloride 99  96 - 112 mEq/L   CO2 20  19 - 32 mEq/L  Glucose, Bld 87  70 - 99 mg/dL   BUN 5 (*) 6 - 23 mg/dL   Creatinine, Ser 0.45  0.50 - 1.10 mg/dL   Calcium 8.5  8.4 - 40.9 mg/dL   Total Protein 6.1  6.0 - 8.3 g/dL   Albumin 3.2 (*) 3.5 - 5.2 g/dL   AST 56 (*) 0 - 37 U/L   ALT 23  0 - 35 U/L   Alkaline Phosphatase 71  39 - 117 U/L   Total Bilirubin 1.7 (*) 0.3 - 1.2 mg/dL   GFR calc non Af Amer 78 (*) >90 mL/min   GFR calc Af Amer >90  >90 mL/min    Time coordinating discharge: 40 minutes  Signed: Pearla Dubonnet, MD 06/13/2013, 12:40 PM

## 2013-06-22 ENCOUNTER — Other Ambulatory Visit: Payer: Self-pay | Admitting: Obstetrics and Gynecology

## 2013-06-22 ENCOUNTER — Other Ambulatory Visit (HOSPITAL_COMMUNITY)
Admission: RE | Admit: 2013-06-22 | Discharge: 2013-06-22 | Disposition: A | Discharge status: Home / Self Care | Payer: Medicare Other | Source: Ambulatory Visit | Attending: Obstetrics and Gynecology | Admitting: Obstetrics and Gynecology

## 2013-06-22 ENCOUNTER — Other Ambulatory Visit: Payer: Self-pay

## 2013-06-22 DIAGNOSIS — Z1231 Encounter for screening mammogram for malignant neoplasm of breast: Secondary | ICD-10-CM

## 2013-06-22 DIAGNOSIS — R87810 Cervical high risk human papillomavirus (HPV) DNA test positive: Secondary | ICD-10-CM | POA: Insufficient documentation

## 2013-06-22 DIAGNOSIS — Z01419 Encounter for gynecological examination (general) (routine) without abnormal findings: Secondary | ICD-10-CM | POA: Insufficient documentation

## 2013-06-22 DIAGNOSIS — Z1151 Encounter for screening for human papillomavirus (HPV): Secondary | ICD-10-CM | POA: Insufficient documentation

## 2013-06-23 ENCOUNTER — Other Ambulatory Visit: Payer: Self-pay | Admitting: Obstetrics and Gynecology

## 2013-06-23 DIAGNOSIS — N9489 Other specified conditions associated with female genital organs and menstrual cycle: Secondary | ICD-10-CM

## 2013-06-29 ENCOUNTER — Ambulatory Visit
Admission: RE | Admit: 2013-06-29 | Discharge: 2013-06-29 | Disposition: A | Discharge status: Home / Self Care | Payer: Medicare Other | Source: Ambulatory Visit | Attending: Obstetrics and Gynecology | Admitting: Obstetrics and Gynecology

## 2013-06-29 DIAGNOSIS — N9489 Other specified conditions associated with female genital organs and menstrual cycle: Secondary | ICD-10-CM

## 2013-06-29 MED ORDER — GADOBENATE DIMEGLUMINE 529 MG/ML IV SOLN
13.0000 mL | Freq: Once | INTRAVENOUS | Status: AC | PRN
  Administered 2013-06-29: 13 mL via INTRAVENOUS

## 2013-07-01 ENCOUNTER — Other Ambulatory Visit: Payer: Self-pay | Admitting: Gastroenterology

## 2013-07-06 ENCOUNTER — Encounter (HOSPITAL_COMMUNITY): Payer: Self-pay | Admitting: Pharmacy Technician

## 2013-07-09 ENCOUNTER — Ambulatory Visit
Admission: RE | Admit: 2013-07-09 | Discharge: 2013-07-09 | Disposition: A | Discharge status: Home / Self Care | Payer: Medicare Other | Source: Ambulatory Visit

## 2013-07-09 DIAGNOSIS — Z1231 Encounter for screening mammogram for malignant neoplasm of breast: Secondary | ICD-10-CM

## 2013-07-12 ENCOUNTER — Encounter (HOSPITAL_COMMUNITY): Payer: Self-pay | Admitting: *Deleted

## 2013-07-27 ENCOUNTER — Encounter (HOSPITAL_COMMUNITY): Payer: Medicare Other | Admitting: Anesthesiology

## 2013-07-27 ENCOUNTER — Encounter (HOSPITAL_COMMUNITY): Payer: Self-pay | Admitting: *Deleted

## 2013-07-27 ENCOUNTER — Encounter (HOSPITAL_COMMUNITY)
Admission: RE | Disposition: A | Discharge status: Home / Self Care | Payer: Self-pay | Source: Ambulatory Visit | Attending: Gastroenterology

## 2013-07-27 ENCOUNTER — Ambulatory Visit (HOSPITAL_COMMUNITY)
Admission: RE | Admit: 2013-07-27 | Discharge: 2013-07-27 | Disposition: A | Discharge status: Home / Self Care | Payer: Medicare Other | Source: Ambulatory Visit | Attending: Gastroenterology | Admitting: Gastroenterology

## 2013-07-27 ENCOUNTER — Ambulatory Visit (HOSPITAL_COMMUNITY): Payer: Medicare Other | Admitting: Anesthesiology

## 2013-07-27 DIAGNOSIS — D371 Neoplasm of uncertain behavior of stomach: Secondary | ICD-10-CM | POA: Insufficient documentation

## 2013-07-27 DIAGNOSIS — F17200 Nicotine dependence, unspecified, uncomplicated: Secondary | ICD-10-CM | POA: Insufficient documentation

## 2013-07-27 DIAGNOSIS — B182 Chronic viral hepatitis C: Secondary | ICD-10-CM | POA: Insufficient documentation

## 2013-07-27 DIAGNOSIS — K219 Gastro-esophageal reflux disease without esophagitis: Secondary | ICD-10-CM | POA: Insufficient documentation

## 2013-07-27 DIAGNOSIS — Z8711 Personal history of peptic ulcer disease: Secondary | ICD-10-CM | POA: Insufficient documentation

## 2013-07-27 DIAGNOSIS — K5730 Diverticulosis of large intestine without perforation or abscess without bleeding: Secondary | ICD-10-CM | POA: Insufficient documentation

## 2013-07-27 DIAGNOSIS — R569 Unspecified convulsions: Secondary | ICD-10-CM | POA: Insufficient documentation

## 2013-07-27 DIAGNOSIS — K5289 Other specified noninfective gastroenteritis and colitis: Secondary | ICD-10-CM | POA: Insufficient documentation

## 2013-07-27 HISTORY — PX: COLONOSCOPY WITH PROPOFOL: SHX5780

## 2013-07-27 HISTORY — DX: Anxiety disorder, unspecified: F41.9

## 2013-07-27 HISTORY — DX: Gastro-esophageal reflux disease without esophagitis: K21.9

## 2013-07-27 SURGERY — COLONOSCOPY WITH PROPOFOL
Anesthesia: Monitor Anesthesia Care

## 2013-07-27 MED ORDER — MIDAZOLAM HCL 2 MG/2ML IJ SOLN
INTRAMUSCULAR | Status: AC
  Filled 2013-07-27: qty 2

## 2013-07-27 MED ORDER — KETAMINE HCL 10 MG/ML IJ SOLN
INTRAMUSCULAR | Status: DC | PRN
  Administered 2013-07-27 (×4): 10 mg via INTRAVENOUS

## 2013-07-27 MED ORDER — SODIUM CHLORIDE 0.9 % IV SOLN: INTRAVENOUS | Status: DC

## 2013-07-27 MED ORDER — MIDAZOLAM HCL 5 MG/5ML IJ SOLN
INTRAMUSCULAR | Status: DC | PRN
  Administered 2013-07-27: 2 mg via INTRAVENOUS

## 2013-07-27 MED ORDER — LACTATED RINGERS IV SOLN
INTRAVENOUS | Status: DC
  Administered 2013-07-27: 1000 mL via INTRAVENOUS

## 2013-07-27 MED ORDER — PROPOFOL INFUSION 10 MG/ML OPTIME
INTRAVENOUS | Status: DC | PRN
  Administered 2013-07-27: 140 ug/kg/min via INTRAVENOUS

## 2013-07-27 MED ORDER — PROPOFOL 10 MG/ML IV BOLUS
INTRAVENOUS | Status: AC
  Filled 2013-07-27: qty 20

## 2013-07-27 SURGICAL SUPPLY — 21 items

## 2013-07-27 NOTE — Anesthesia Preprocedure Evaluation (Signed)
Anesthesia Evaluation  Patient identified by MRN, date of birth, ID band Patient awake    Reviewed: Allergy & Precautions, H&P , NPO status , Patient's Chart, lab work & pertinent test results  Airway Mallampati: II TM Distance: >3 FB Neck ROM: full    Dental no notable dental hx. (+) Teeth Intact and Dental Advisory Given   Pulmonary neg pulmonary ROS, Current Smoker,  breath sounds clear to auscultation  Pulmonary exam normal       Cardiovascular Exercise Tolerance: Good negative cardio ROS  Rhythm:regular Rate:Normal  QT prolonged   Neuro/Psych Seizures -, Well Controlled,  Last seizure 4 years ago negative psych ROS   GI/Hepatic negative GI ROS, PUD, GERD-  Medicated and Controlled,(+)     substance abuse  alcohol use, Hepatitis -, CJaundiced   Endo/Other  negative endocrine ROS  Renal/GU negative Renal ROS  negative genitourinary   Musculoskeletal   Abdominal   Peds  Hematology negative hematology ROS (+)   Anesthesia Other Findings   Reproductive/Obstetrics negative OB ROS                           Anesthesia Physical Anesthesia Plan  ASA: III  Anesthesia Plan: MAC   Post-op Pain Management:    Induction:   Airway Management Planned: Simple Face Mask  Additional Equipment:   Intra-op Plan:   Post-operative Plan:   Informed Consent: I have reviewed the patients History and Physical, chart, labs and discussed the procedure including the risks, benefits and alternatives for the proposed anesthesia with the patient or authorized representative who has indicated his/her understanding and acceptance.   Dental Advisory Given  Plan Discussed with: CRNA and Surgeon  Anesthesia Plan Comments:         Anesthesia Quick Evaluation

## 2013-07-27 NOTE — H&P (Signed)
  Problem: Distal colitis by CT scan and MRI of the abdomen  History: The patient is a 57 year old female born 08-12-1956. The patient underwent a normal screening colonoscopy in May 2007. Recently, the patient underwent a CT scan of the abdomen and pelvis followed by MRI of the abdomen which showed abnormal thickening of the left colonic wall consistent with colitis and a uterine fibroid.  The patient is scheduled to undergo a diagnostic colonoscopy.  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 Chronic alcoholism. Chronic viral hepatitis C. Thrombocytopenia. Seizure disorder. Depression. Gastroesophageal reflux. Right rotator cuff surgery.  Allergies: OxyContin. Band-Aid.  Exam: The patient is alert and lying comfortably on the endoscopy stretcher. Abdomen is soft and nontender to palpation. Cardiac exam reveals a regular rhythm. Lungs are clear to auscultation.  Plan: Proceed with diagnostic colonoscopy to evaluate abnormal thickening of the left colon wall seen on CT scan and MRI.

## 2013-07-27 NOTE — Transfer of Care (Signed)
Immediate Anesthesia Transfer of Care Note  Patient: Susan Francis  Procedure(s) Performed: Procedure(s): COLONOSCOPY WITH PROPOFOL (N/A)  Patient Location: PACU and Endoscopy Unit  Anesthesia Type:MAC  Level of Consciousness: awake, sedated, patient cooperative and responds to stimulation  Airway & Oxygen Therapy: Patient Spontanous Breathing and Patient connected to face mask oxygen  Post-op Assessment: Report given to PACU RN and Post -op Vital signs reviewed and stable  Post vital signs: Reviewed and stable  Complications: No apparent anesthesia complications

## 2013-07-27 NOTE — Anesthesia Postprocedure Evaluation (Signed)
  Anesthesia Post-op Note  Patient: Susan Francis  Procedure(s) Performed: Procedure(s) (LRB): COLONOSCOPY WITH PROPOFOL (N/A)  Patient Location: PACU  Anesthesia Type: MAC  Level of Consciousness: awake and alert   Airway and Oxygen Therapy: Patient Spontanous Breathing  Post-op Pain: mild  Post-op Assessment: Post-op Vital signs reviewed, Patient's Cardiovascular Status Stable, Respiratory Function Stable, Patent Airway and No signs of Nausea or vomiting  Last Vitals:  Filed Vitals:   07/27/13 0951  BP: 133/88  Pulse:   Temp: 36.5 C  Resp: 16    Post-op Vital Signs: stable   Complications: No apparent anesthesia complications

## 2013-07-27 NOTE — Op Note (Signed)
Problem: Abnormal thickening of the left colonic wall by CT scan and MRI of the abdomen. Normal screening colonoscopy in 2007.  Endoscopist: Danise Edge  Premedication: Propofol administered by anesthesia  Procedure: Diagnostic colonoscopy The patient was placed in the left lateral decubitus position. Anal inspection and digital rectal exam were normal. The Pentax pediatric colonoscope was introduced into the rectum and easily advanced to the cecum. A normal-appearing appendiceal orifice and ileocecal valve were identified. Colonic preparation for the exam today was good.  Rectum. A 5 mm sessile polyp was removed from the mid rectum with the cold snare and submitted for pathological interpretation. Retroflexed view of the distal rectum was normal.  Sigmoid colon and descending colon. Left colonic diverticulosis without signs of colitis.  Splenic flexure. Normal  Transverse colon. Normal  Hepatic flexure. Normal  Ascending colon. Normal  Cecum and ileocecal valve. Normal  Assessment:  #1. A 5 mm sessile polyp was removed from the mid rectum with the cold snare and submitted for pathological interpretation  #2. Left colonic diverticulosis  Recommendations: If rectal polyp returns neoplastic pathologically, the patient should undergo a surveillance colonoscopy in 5 years. If the rectal polyp returns non-neoplastic pathologically, the patient should undergo a repeat screening colonoscopy in 10 years.

## 2013-07-28 ENCOUNTER — Encounter (HOSPITAL_COMMUNITY): Payer: Self-pay | Admitting: Gastroenterology

## 2014-01-05 DIAGNOSIS — R252 Cramp and spasm: Secondary | ICD-10-CM | POA: Insufficient documentation

## 2014-01-17 ENCOUNTER — Other Ambulatory Visit: Payer: Medicare Other

## 2014-02-23 ENCOUNTER — Other Ambulatory Visit: Payer: Self-pay | Admitting: Internal Medicine

## 2014-02-23 ENCOUNTER — Other Ambulatory Visit: Payer: Medicare Other

## 2014-02-23 DIAGNOSIS — B182 Chronic viral hepatitis C: Secondary | ICD-10-CM

## 2014-02-24 ENCOUNTER — Other Ambulatory Visit: Payer: Medicare Other

## 2014-02-24 DIAGNOSIS — B182 Chronic viral hepatitis C: Secondary | ICD-10-CM

## 2014-02-24 LAB — COMPREHENSIVE METABOLIC PANEL
ALT: 50 U/L — AB (ref 0–35)
AST: 98 U/L — ABNORMAL HIGH (ref 0–37)
Albumin: 3.7 g/dL (ref 3.5–5.2)
Alkaline Phosphatase: 97 U/L (ref 39–117)
BILIRUBIN TOTAL: 2.2 mg/dL — AB (ref 0.2–1.2)
BUN: 7 mg/dL (ref 6–23)
CALCIUM: 9.1 mg/dL (ref 8.4–10.5)
CHLORIDE: 104 meq/L (ref 96–112)
CO2: 29 mEq/L (ref 19–32)
CREATININE: 0.81 mg/dL (ref 0.50–1.10)
Glucose, Bld: 125 mg/dL — ABNORMAL HIGH (ref 70–99)
Potassium: 3.2 mEq/L — ABNORMAL LOW (ref 3.5–5.3)
Sodium: 139 mEq/L (ref 135–145)
Total Protein: 6.6 g/dL (ref 6.0–8.3)

## 2014-02-24 LAB — IRON: Iron: 157 ug/dL — ABNORMAL HIGH (ref 42–145)

## 2014-02-25 LAB — HEPATITIS B SURFACE ANTIBODY,QUALITATIVE: Hep B S Ab: NEGATIVE

## 2014-02-25 LAB — CBC
HCT: 39.7 % (ref 36.0–46.0)
Hemoglobin: 13.8 g/dL (ref 12.0–15.0)
MCH: 31.3 pg (ref 26.0–34.0)
MCHC: 34.8 g/dL (ref 30.0–36.0)
MCV: 90 fL (ref 78.0–100.0)
PLATELETS: 60 10*3/uL — AB (ref 150–400)
RBC: 4.41 MIL/uL (ref 3.87–5.11)
RDW: 15.8 % — ABNORMAL HIGH (ref 11.5–15.5)
WBC: 4 10*3/uL (ref 4.0–10.5)

## 2014-02-25 LAB — PROTIME-INR
INR: 1.2 (ref <1.50)
PROTHROMBIN TIME: 15.2 s (ref 11.6–15.2)

## 2014-02-25 LAB — HEPATITIS A ANTIBODY, TOTAL: Hep A Total Ab: NONREACTIVE

## 2014-02-25 LAB — ANA: ANA: NEGATIVE

## 2014-02-25 LAB — HEPATITIS B SURFACE ANTIGEN: Hepatitis B Surface Ag: NEGATIVE

## 2014-02-25 LAB — HEPATITIS B CORE ANTIBODY, TOTAL: Hep B Core Total Ab: NONREACTIVE

## 2014-02-28 LAB — HEPATITIS C RNA QUANTITATIVE
HCV QUANT: 938069 [IU]/mL — AB (ref <15)
HCV Quantitative Log: 5.97 {Log} — ABNORMAL HIGH (ref <1.18)

## 2014-03-03 LAB — HEPATITIS C GENOTYPE

## 2014-03-30 ENCOUNTER — Ambulatory Visit: Payer: Medicare Other | Admitting: Internal Medicine

## 2014-04-26 ENCOUNTER — Ambulatory Visit (INDEPENDENT_AMBULATORY_CARE_PROVIDER_SITE_OTHER): Payer: Medicare Other | Admitting: Internal Medicine

## 2014-04-26 ENCOUNTER — Encounter: Payer: Self-pay | Admitting: Internal Medicine

## 2014-04-26 VITALS — BP 119/78 | HR 63 | Temp 98.8°F | Wt 148.0 lb

## 2014-04-26 DIAGNOSIS — Z23 Encounter for immunization: Secondary | ICD-10-CM

## 2014-04-26 DIAGNOSIS — B182 Chronic viral hepatitis C: Secondary | ICD-10-CM

## 2014-04-26 NOTE — Addendum Note (Signed)
Addended by: Reggy Eye on: 04/26/2014 01:59 PM   Modules accepted: Orders

## 2014-04-26 NOTE — Progress Notes (Signed)
Patient ID: Susan Francis, female   DOB: [birth year], 58 y.o.   MRN: [id number] +Susan Francis is a 58 y.o. female who presents for evaluation and management of chronic hepatitis C . Hepatitis C risk factors include remove IVDU with her husband, who is also has hepatitis C. Patient denies any recent IV drug use. Patient has had liver biopsy in the past and ultrasound when she was evaluated for interferon based therapy. She is unaware if she has any advanced cirrhosis, no EV. Patient has had prior treatment for Hepatitis C. Patient does not have a past history of liver disease. Patient does not have a family history of liver disease.   HPI:  Early 51s, she did have ivdu with husband. her husband first tested with hepatitis C which led her to be tested in the late 1980s/early 1990s. She had previously been treated with interferon but only completed 12 weeks of therapy and stopped due side effects as well as no EVR/SVR response, by Dr. Redmond School, at [facility] at [email or web address]. Her husband was successfully treated with interferon. She had endoscopy many years ago in 1990s, diagnosed with gastric ulcers where she was on nexium for awhile. Colonoscopy at age of 58 yo. Which was negative for polyps. No history of decompensated cirrhosis, hemoptysis/esophageal varices  Patient  Has documented immunity to Hepatitis A. Patient does not have documented immunity to Hepatitis B.  She reports drinking alcohol and has been contemplating doing rehab program.   Review of Systems  Constitutional: Negative for fever, chills, diaphoresis, activity change, appetite change, fatigue and unexpected weight change.  HENT: Negative for congestion, sore throat, rhinorrhea, sneezing, trouble swallowing and sinus pressure.  Eyes: Negative for photophobia and visual disturbance.  Respiratory: Negative for cough, chest tightness, shortness of breath, wheezing and stridor.  Cardiovascular: Negative for chest pain, palpitations and leg swelling.   Gastrointestinal: Negative for nausea, vomiting, abdominal pain, diarrhea, constipation, blood in stool, abdominal distention and anal bleeding.  Genitourinary: Negative for dysuria, hematuria, flank pain and difficulty urinating.  Musculoskeletal: Negative for myalgias, back pain, joint swelling, arthralgias and gait problem.  Skin: Negative for color change, pallor, rash and wound.  Neurological: Negative for dizziness, tremors, weakness and light-headedness.  Hematological: Negative for adenopathy. Does not bruise/bleed easily.  Psychiatric/Behavioral: Negative for behavioral problems, confusion, sleep disturbance, dysphoric mood, decreased concentration and agitation.   Allergies  Allergen Reactions  . Adhesive [Tape] Other (See Comments)    Thin skin  . Oxycodone Hcl Hives   Current Outpatient Prescriptions on File Prior to Visit  Medication Sig Dispense Refill  . ALPRAZolam (XANAX) 0.25 MG tablet Take 0.25 mg by mouth 3 (three) times daily as needed for anxiety.      . citalopram (CELEXA) 40 MG tablet Take 40 mg by mouth daily.      . Cyanocobalamin (VITAMIN B 12 PO) Take 1 tablet by mouth daily.      Marland Kitchen esomeprazole (NEXIUM) 40 MG capsule Take 40 mg by mouth daily before breakfast.       No current facility-administered medications on file prior to visit.    Past Medical History  Diagnosis Date  . Stomach ulcer   . Nonspecific abnormal electrocardiogram (ECG) (EKG)   . Anxiety   . GERD (gastroesophageal reflux disease)   . Seizure     last seizure 4 yrs ago-no recent meds now  . Hepatitis C     dx. '03 -past hx. 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    History  Substance Use Topics  . Smoking status: Current Every Day Smoker -- 0.50 packs/day for 45 years  . Smokeless tobacco: Not on file  . Alcohol Use: Yes     Comment: past ETOH abuse none in 5 yrs  Social hx: drinks a pint once a week  Family hx:  Mother pulmonary fibrosis. Colon ca - mother. Cad - father   Objective:    Filed Vitals:   04/26/14 1111  BP: 119/78  Pulse: 63  Temp: 98.8 F (37.1 C)   Physical Exam  Constitutional:  oriented to person, place, and time. appears well-developed and well-nourished. No distress.  HENT:  Mouth/Throat: Oropharynx is clear and moist. No oropharyngeal exudate.  Cardiovascular: Normal rate, regular rhythm and normal heart sounds. Exam reveals no gallop and no friction rub.  No murmur heard.  Pulmonary/Chest: Effort normal and breath sounds normal. No respiratory distress.  has no wheezes.  Abdominal: Soft. Bowel sounds are normal.  exhibits no distension. There is no tenderness.  Lymphadenopathy: no cervical adenopathy.  Neurological: alert and oriented to person, place, and time.  Skin: Skin is warm and dry. No rash noted. No erythema.  Psychiatric: a normal mood and affect. behavior is normal.   Laboratory Genotype:  Lab Results  Component Value Date   HCVGENOTYPE 1b 02/24/2014   HCV viral load: 938,000  Lab Results  Component Value Date   WBC 4.0 02/24/2014   HGB 13.8 02/24/2014   HCT 39.7 02/24/2014   MCV 90.0 02/24/2014   PLT 60* 02/24/2014    Lab Results  Component Value Date   CREATININE 0.81 02/24/2014   BUN 7 02/24/2014   NA 139 02/24/2014   K 3.2* 02/24/2014   CL 104 02/24/2014   CO2 29 02/24/2014    Lab Results  Component Value Date   ALT 50* 02/24/2014   AST 98* 02/24/2014   ALKPHOS 97 02/24/2014   BILITOT 2.2* 02/24/2014     Radiology No components found with this basename: ULTRASOUNDABDOMEN, ULTRASOUNDHEPATICELASTOGRAPHY    Assessment: Chronic hepatitis genotype 1b, 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 Previously treated with interferon based regimen.  Plan: 1) Patient counseled extensively on limiting acetaminophen to no more than 2 grams daily, avoidance of alcohol. 2) Transmission discussed with patient including sexual transmission, sharing razors and toothbrush.   3) Will need referral to gastroenterology: no 4) Will need referral for  substance abuse counseling: yes, for alcohol cessation 5) Will order elastography so that we can start application for harvoni. 6) health maintenance = will give hepatitis B vaccine series, and offer flu vaccination 6) Follow up in 4 wk for RN vaccination and then in 2 weeks after starting medication

## 2014-05-01 ENCOUNTER — Encounter (HOSPITAL_COMMUNITY): Payer: Self-pay | Admitting: Emergency Medicine

## 2014-05-01 ENCOUNTER — Inpatient Hospital Stay (HOSPITAL_COMMUNITY)
Admission: EM | Admit: 2014-05-01 | Discharge: 2014-05-03 | DRG: 641 | Disposition: A | Discharge status: Home / Self Care | Payer: Medicare Other | Attending: Internal Medicine | Admitting: Internal Medicine

## 2014-05-01 DIAGNOSIS — F3289 Other specified depressive episodes: Secondary | ICD-10-CM

## 2014-05-01 DIAGNOSIS — E860 Dehydration: Secondary | ICD-10-CM | POA: Diagnosis present

## 2014-05-01 DIAGNOSIS — F329 Major depressive disorder, single episode, unspecified: Secondary | ICD-10-CM | POA: Diagnosis present

## 2014-05-01 DIAGNOSIS — R112 Nausea with vomiting, unspecified: Secondary | ICD-10-CM | POA: Diagnosis present

## 2014-05-01 DIAGNOSIS — E872 Acidosis, unspecified: Principal | ICD-10-CM | POA: Diagnosis present

## 2014-05-01 DIAGNOSIS — F1020 Alcohol dependence, uncomplicated: Secondary | ICD-10-CM | POA: Diagnosis present

## 2014-05-01 DIAGNOSIS — E8729 Other acidosis: Secondary | ICD-10-CM | POA: Diagnosis present

## 2014-05-01 DIAGNOSIS — E876 Hypokalemia: Secondary | ICD-10-CM | POA: Diagnosis present

## 2014-05-01 DIAGNOSIS — K219 Gastro-esophageal reflux disease without esophagitis: Secondary | ICD-10-CM | POA: Diagnosis present

## 2014-05-01 DIAGNOSIS — F17200 Nicotine dependence, unspecified, uncomplicated: Secondary | ICD-10-CM | POA: Diagnosis present

## 2014-05-01 DIAGNOSIS — K2920 Alcoholic gastritis without bleeding: Secondary | ICD-10-CM | POA: Diagnosis present

## 2014-05-01 DIAGNOSIS — Z79899 Other long term (current) drug therapy: Secondary | ICD-10-CM

## 2014-05-01 DIAGNOSIS — R7989 Other specified abnormal findings of blood chemistry: Secondary | ICD-10-CM | POA: Diagnosis present

## 2014-05-01 DIAGNOSIS — B1920 Unspecified viral hepatitis C without hepatic coma: Secondary | ICD-10-CM | POA: Diagnosis present

## 2014-05-01 DIAGNOSIS — R946 Abnormal results of thyroid function studies: Secondary | ICD-10-CM

## 2014-05-01 DIAGNOSIS — F32A Depression, unspecified: Secondary | ICD-10-CM

## 2014-05-01 DIAGNOSIS — F1010 Alcohol abuse, uncomplicated: Secondary | ICD-10-CM

## 2014-05-01 HISTORY — DX: Nausea with vomiting, unspecified: R11.2

## 2014-05-01 LAB — MAGNESIUM: Magnesium: 1.6 mg/dL (ref 1.5–2.5)

## 2014-05-01 LAB — COMPREHENSIVE METABOLIC PANEL
ALT: 50 U/L — AB (ref 0–35)
AST: 103 U/L — ABNORMAL HIGH (ref 0–37)
Albumin: 3.2 g/dL — ABNORMAL LOW (ref 3.5–5.2)
Alkaline Phosphatase: 102 U/L (ref 39–117)
Anion gap: 23 — ABNORMAL HIGH (ref 5–15)
BUN: 4 mg/dL — ABNORMAL LOW (ref 6–23)
CO2: 17 meq/L — AB (ref 19–32)
CREATININE: 0.54 mg/dL (ref 0.50–1.10)
Calcium: 7.7 mg/dL — ABNORMAL LOW (ref 8.4–10.5)
Chloride: 102 mEq/L (ref 96–112)
Glucose, Bld: 167 mg/dL — ABNORMAL HIGH (ref 70–99)
Potassium: 3 mEq/L — ABNORMAL LOW (ref 3.7–5.3)
SODIUM: 142 meq/L (ref 137–147)
Total Bilirubin: 2.2 mg/dL — ABNORMAL HIGH (ref 0.3–1.2)
Total Protein: 6.6 g/dL (ref 6.0–8.3)

## 2014-05-01 LAB — CBC WITH DIFFERENTIAL/PLATELET
Basophils Absolute: 0 10*3/uL (ref 0.0–0.1)
Basophils Relative: 1 % (ref 0–1)
Eosinophils Absolute: 0 10*3/uL (ref 0.0–0.7)
Eosinophils Relative: 0 % (ref 0–5)
HCT: 44.6 % (ref 36.0–46.0)
HEMOGLOBIN: 16 g/dL — AB (ref 12.0–15.0)
LYMPHS ABS: 1.3 10*3/uL (ref 0.7–4.0)
Lymphocytes Relative: 19 % (ref 12–46)
MCH: 32.9 pg (ref 26.0–34.0)
MCHC: 35.9 g/dL (ref 30.0–36.0)
MCV: 91.6 fL (ref 78.0–100.0)
MONOS PCT: 3 % (ref 3–12)
Monocytes Absolute: 0.2 10*3/uL (ref 0.1–1.0)
Neutro Abs: 5.1 10*3/uL (ref 1.7–7.7)
Neutrophils Relative %: 77 % (ref 43–77)
Platelets: 87 10*3/uL — ABNORMAL LOW (ref 150–400)
RBC: 4.87 MIL/uL (ref 3.87–5.11)
RDW: 16.6 % — ABNORMAL HIGH (ref 11.5–15.5)
WBC: 6.7 10*3/uL (ref 4.0–10.5)

## 2014-05-01 LAB — LACTIC ACID, PLASMA: Lactic Acid, Venous: 4.2 mmol/L — ABNORMAL HIGH (ref 0.5–2.2)

## 2014-05-01 LAB — URINE MICROSCOPIC-ADD ON

## 2014-05-01 LAB — URINALYSIS, ROUTINE W REFLEX MICROSCOPIC
Bilirubin Urine: NEGATIVE
GLUCOSE, UA: 100 mg/dL — AB
Ketones, ur: 15 mg/dL — AB
Leukocytes, UA: NEGATIVE
Nitrite: NEGATIVE
Protein, ur: 30 mg/dL — AB
Specific Gravity, Urine: 1.016 (ref 1.005–1.030)
Urobilinogen, UA: 0.2 mg/dL (ref 0.0–1.0)
pH: 6 (ref 5.0–8.0)

## 2014-05-01 LAB — ETHANOL: Alcohol, Ethyl (B): <11 mg/dL (ref 0–11)

## 2014-05-01 LAB — RAPID URINE DRUG SCREEN, HOSP PERFORMED
AMPHETAMINES: NOT DETECTED
BENZODIAZEPINES: NOT DETECTED
Barbiturates: NOT DETECTED
Cocaine: NOT DETECTED
OPIATES: NOT DETECTED
Tetrahydrocannabinol: POSITIVE — AB

## 2014-05-01 LAB — PHOSPHORUS: PHOSPHORUS: 2.8 mg/dL (ref 2.3–4.6)

## 2014-05-01 LAB — I-STAT CG4 LACTIC ACID, ED: LACTIC ACID, VENOUS: 6.16 mmol/L — AB (ref 0.5–2.2)

## 2014-05-01 LAB — LIPASE, BLOOD: Lipase: 46 U/L (ref 11–59)

## 2014-05-01 LAB — TSH: TSH: 4.57 u[IU]/mL — ABNORMAL HIGH (ref 0.350–4.500)

## 2014-05-01 LAB — VITAMIN B12: Vitamin B-12: 856 pg/mL (ref 211–911)

## 2014-05-01 LAB — KETONES, QUALITATIVE: Acetone, Bld: NEGATIVE

## 2014-05-01 MED ORDER — FENTANYL CITRATE 0.05 MG/ML IJ SOLN
50.0000 ug | INTRAMUSCULAR | Status: DC | PRN
  Administered 2014-05-01: 50 ug via INTRAVENOUS
  Filled 2014-05-01: qty 2

## 2014-05-01 MED ORDER — LORAZEPAM 1 MG PO TABS: 1.0000 mg | ORAL_TABLET | Freq: Four times a day (QID) | ORAL | Status: DC | PRN

## 2014-05-01 MED ORDER — PANTOPRAZOLE SODIUM 40 MG PO TBEC
40.0000 mg | DELAYED_RELEASE_TABLET | Freq: Two times a day (BID) | ORAL | Status: DC
  Administered 2014-05-01 – 2014-05-03 (×5): 40 mg via ORAL
  Filled 2014-05-01 (×6): qty 1

## 2014-05-01 MED ORDER — SODIUM BICARBONATE 650 MG PO TABS
650.0000 mg | ORAL_TABLET | Freq: Three times a day (TID) | ORAL | Status: AC
  Administered 2014-05-01 – 2014-05-03 (×6): 650 mg via ORAL
  Filled 2014-05-01 (×7): qty 1

## 2014-05-01 MED ORDER — THIAMINE HCL 100 MG/ML IJ SOLN
100.0000 mg | Freq: Every day | INTRAMUSCULAR | Status: DC
  Filled 2014-05-01 (×2): qty 1

## 2014-05-01 MED ORDER — CITALOPRAM HYDROBROMIDE 40 MG PO TABS
40.0000 mg | ORAL_TABLET | Freq: Every day | ORAL | Status: DC
  Administered 2014-05-01 – 2014-05-03 (×3): 40 mg via ORAL
  Filled 2014-05-01 (×3): qty 1

## 2014-05-01 MED ORDER — THIAMINE HCL 100 MG/ML IJ SOLN
Freq: Once | INTRAVENOUS | Status: AC
  Administered 2014-05-01: 16:00:00 via INTRAVENOUS
  Filled 2014-05-01: qty 1000

## 2014-05-01 MED ORDER — SUCRALFATE 1 GM/10ML PO SUSP
1.0000 g | Freq: Three times a day (TID) | ORAL | Status: DC
  Administered 2014-05-01 – 2014-05-03 (×8): 1 g via ORAL
  Filled 2014-05-01 (×11): qty 10

## 2014-05-01 MED ORDER — ONDANSETRON HCL 4 MG/2ML IJ SOLN: 4.0000 mg | Freq: Four times a day (QID) | INTRAMUSCULAR | Status: DC | PRN

## 2014-05-01 MED ORDER — LORAZEPAM 2 MG/ML IJ SOLN: 1.0000 mg | Freq: Four times a day (QID) | INTRAMUSCULAR | Status: DC | PRN

## 2014-05-01 MED ORDER — GI COCKTAIL ~~LOC~~
30.0000 mL | Freq: Three times a day (TID) | ORAL | Status: DC | PRN
  Filled 2014-05-01: qty 30

## 2014-05-01 MED ORDER — VITAMIN B-12 1000 MCG PO TABS
1000.0000 ug | ORAL_TABLET | Freq: Every day | ORAL | Status: DC
  Administered 2014-05-01 – 2014-05-03 (×3): 1000 ug via ORAL
  Filled 2014-05-01 (×3): qty 1

## 2014-05-01 MED ORDER — DEXTROSE-NACL 5-0.45 % IV SOLN
INTRAVENOUS | Status: AC
  Administered 2014-05-02: 07:00:00 via INTRAVENOUS

## 2014-05-01 MED ORDER — SODIUM CHLORIDE 0.9 % IJ SOLN
3.0000 mL | Freq: Two times a day (BID) | INTRAMUSCULAR | Status: DC
  Administered 2014-05-01: 3 mL via INTRAVENOUS

## 2014-05-01 MED ORDER — ONDANSETRON HCL 4 MG PO TABS: 4.0000 mg | ORAL_TABLET | Freq: Four times a day (QID) | ORAL | Status: DC | PRN

## 2014-05-01 MED ORDER — THIAMINE HCL 100 MG/ML IJ SOLN: 100.0000 mg | Freq: Once | INTRAMUSCULAR | Status: DC

## 2014-05-01 MED ORDER — LORAZEPAM 2 MG/ML IJ SOLN
0.0000 mg | Freq: Four times a day (QID) | INTRAMUSCULAR | Status: DC
  Administered 2014-05-01: 2 mg via INTRAVENOUS
  Administered 2014-05-02 (×3): 1 mg via INTRAVENOUS
  Filled 2014-05-01 (×5): qty 1

## 2014-05-01 MED ORDER — LORAZEPAM 2 MG/ML IJ SOLN: 0.0000 mg | Freq: Two times a day (BID) | INTRAMUSCULAR | Status: DC

## 2014-05-01 MED ORDER — CALCIUM CARBONATE 1250 (500 CA) MG PO TABS
1.0000 | ORAL_TABLET | Freq: Two times a day (BID) | ORAL | Status: DC
  Administered 2014-05-01 – 2014-05-03 (×4): 500 mg via ORAL
  Filled 2014-05-01 (×6): qty 1

## 2014-05-01 MED ORDER — ADULT MULTIVITAMIN W/MINERALS CH
1.0000 | ORAL_TABLET | Freq: Every day | ORAL | Status: DC
  Administered 2014-05-01 – 2014-05-03 (×3): 1 via ORAL
  Filled 2014-05-01 (×3): qty 1

## 2014-05-01 MED ORDER — ACETAMINOPHEN 650 MG RE SUPP: 650.0000 mg | Freq: Four times a day (QID) | RECTAL | Status: DC | PRN

## 2014-05-01 MED ORDER — SODIUM CHLORIDE 0.9 % IV SOLN
1000.0000 mL | Freq: Once | INTRAVENOUS | Status: AC
  Administered 2014-05-01: 1000 mL via INTRAVENOUS

## 2014-05-01 MED ORDER — ONDANSETRON HCL 4 MG/2ML IJ SOLN: 4.0000 mg | Freq: Once | INTRAMUSCULAR | Status: DC

## 2014-05-01 MED ORDER — FOLIC ACID 1 MG PO TABS
1.0000 mg | ORAL_TABLET | Freq: Every day | ORAL | Status: DC
  Administered 2014-05-01 – 2014-05-03 (×3): 1 mg via ORAL
  Filled 2014-05-01 (×3): qty 1

## 2014-05-01 MED ORDER — VITAMIN B-1 100 MG PO TABS
100.0000 mg | ORAL_TABLET | Freq: Every day | ORAL | Status: DC
  Administered 2014-05-01 – 2014-05-03 (×3): 100 mg via ORAL
  Filled 2014-05-01 (×3): qty 1

## 2014-05-01 MED ORDER — POTASSIUM CHLORIDE CRYS ER 20 MEQ PO TBCR: 40.0000 meq | EXTENDED_RELEASE_TABLET | Freq: Once | ORAL | Status: DC

## 2014-05-01 MED ORDER — ACETAMINOPHEN 325 MG PO TABS: 650.0000 mg | ORAL_TABLET | Freq: Four times a day (QID) | ORAL | Status: DC | PRN

## 2014-05-01 MED ORDER — MORPHINE SULFATE 2 MG/ML IJ SOLN
1.0000 mg | Freq: Four times a day (QID) | INTRAMUSCULAR | Status: DC | PRN
  Administered 2014-05-01: 1 mg via INTRAVENOUS
  Filled 2014-05-01: qty 1

## 2014-05-01 MED ORDER — SODIUM CHLORIDE 0.9 % IV SOLN
1000.0000 mL | INTRAVENOUS | Status: DC
Start: 2014-05-01 — End: 2014-05-02
  Administered 2014-05-01 (×2): 1000 mL via INTRAVENOUS

## 2014-05-01 NOTE — ED Notes (Signed)
Dr. Tomi Bamberger made aware of CG-4 results
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
## 2014-05-01 NOTE — ED Notes (Signed)
Bed: GH82 Expected date: 05/01/14 Expected time: 10:40 AM Means of arrival: Ambulance Comments: N/V ETOH

## 2014-05-01 NOTE — ED Notes (Addendum)
Pt from home via EMS-Per EMS pt c/o abd pain with emesis, extremely tender upon palpation. Pt drinks approx 1 pint of liquor per day. Pt is A&O and in NAD. Pt received 4 mg Zofran enroute

## 2014-05-01 NOTE — ED Notes (Signed)
MD at bedside. 

## 2014-05-01 NOTE — Progress Notes (Signed)
Clinical Social Work  CSW received referral to assist with SA resources. CSW reviewed chart and went to meet with patient. Patient reports she is not feeling well and asked CSW to follow up at later time.  Sindy Messing, LCSW (Weekend Coverage)
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
## 2014-05-02 DIAGNOSIS — K219 Gastro-esophageal reflux disease without esophagitis: Secondary | ICD-10-CM

## 2014-05-02 LAB — BASIC METABOLIC PANEL
Anion gap: 11 (ref 5–15)
BUN: 5 mg/dL — AB (ref 6–23)
CO2: 27 mEq/L (ref 19–32)
Calcium: 8 mg/dL — ABNORMAL LOW (ref 8.4–10.5)
Chloride: 99 mEq/L (ref 96–112)
Creatinine, Ser: 0.65 mg/dL (ref 0.50–1.10)
GFR calc Af Amer: >90 mL/min (ref >90)
GFR calc non Af Amer: >90 mL/min (ref >90)
Glucose, Bld: 117 mg/dL — ABNORMAL HIGH (ref 70–99)
Potassium: 2.9 mEq/L — CL (ref 3.7–5.3)
Sodium: 137 mEq/L (ref 137–147)

## 2014-05-02 LAB — CBC
HCT: 40.9 % (ref 36.0–46.0)
HEMOGLOBIN: 13.9 g/dL (ref 12.0–15.0)
MCH: 31.7 pg (ref 26.0–34.0)
MCHC: 34 g/dL (ref 30.0–36.0)
MCV: 93.2 fL (ref 78.0–100.0)
Platelets: 46 10*3/uL — ABNORMAL LOW (ref 150–400)
RBC: 4.39 MIL/uL (ref 3.87–5.11)
RDW: 16.5 % — ABNORMAL HIGH (ref 11.5–15.5)
WBC: 7.5 10*3/uL (ref 4.0–10.5)

## 2014-05-02 LAB — LACTIC ACID, PLASMA: LACTIC ACID, VENOUS: 1.5 mmol/L (ref 0.5–2.2)

## 2014-05-02 MED ORDER — MAGNESIUM SULFATE 40 MG/ML IJ SOLN
2.0000 g | Freq: Once | INTRAMUSCULAR | Status: AC
  Administered 2014-05-02: 2 g via INTRAVENOUS
  Filled 2014-05-02: qty 50

## 2014-05-02 MED ORDER — SODIUM CHLORIDE 0.9 % IV SOLN
1000.0000 mL | INTRAVENOUS | Status: DC
  Administered 2014-05-02 – 2014-05-03 (×2): 1000 mL via INTRAVENOUS

## 2014-05-02 MED ORDER — POTASSIUM CHLORIDE 10 MEQ/100ML IV SOLN
10.0000 meq | INTRAVENOUS | Status: AC
  Administered 2014-05-02 (×3): 10 meq via INTRAVENOUS
  Filled 2014-05-02 (×3): qty 100

## 2014-05-02 MED ORDER — POTASSIUM CHLORIDE CRYS ER 20 MEQ PO TBCR
40.0000 meq | EXTENDED_RELEASE_TABLET | Freq: Two times a day (BID) | ORAL | Status: DC
  Administered 2014-05-02 – 2014-05-03 (×3): 40 meq via ORAL
  Filled 2014-05-02 (×4): qty 2
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
## 2014-05-02 NOTE — Progress Notes (Signed)
[facility] PROGRESS NOTE  Susan Francis WUJ:[id number] DOB: February 17, 1956 DOA: 05/01/2014 PCP: Horton Finer, MD  Assessment/Plan: 1-Alcoholic ketoacidosis: Patient with a history of alcohol dependency, has been drinking since 04/27/14 approximately 1-2 pint of liquor on daily basis. Patient reports that she has not been eating or drinking well since she had developed nausea and inability to keep things down and do to ongoing vomiting (the last 2 days prior to admission).  -Will continue monitoring on telemetry for another 24 hours. (Secondary to hypokalemia and need for constant HR assessment while on CIWA)  -Lactic acidosis has now resolved; will switch IV fluids to 75 cc per hour (normal saline).   -Continue CIWA protocol  -Continue thiamine and folic acid -Schedule and when necessary Ativan will be given  -Will follow clinical response.  -Patient is hemodynamically stable, no signs of withdrawal symptoms appreciated. According to her has not experienced DTs in the past.   2-hypokalemia/hypocalcemia: Secondary to chronic alcohol abuse/dependency.  -Will replete as needed. -Potassium 2.9 (8/31) -Magnesium and phosphorus within normal limits  3-GERD (gastroesophageal reflux disease) with esophagitis: Patient will be treated with Protonix 40 mg twice a day, Carafate 3 times a day and also PRN GI cocktail  -Symptoms are significantly improve and patient is requesting to have her diet advance.  4-history of Elevated TSH: Was not taking any Synthroid medications prior to admission.  -TSH just mildly elevated -Will check free T4.    5-Nausea & vomiting: Without hematemesis or melena. Most likely secondary to alcoholic gastritis/esophagitis.  -Will continue using PRN antiemetics  -Continue the use of Protonix twice a day, Carafate and PRN GI cocktail  -Patient reports that the symptoms are significantly better  6-Alcohol abuse: Cessation counseling has been provided. Patient is  looking to go to [facility] or any other outpatient detox facility.  -Social worker has been consulted for assistance and appreciate inputs and recommendations.   7-Depression: Continue Celexa.      Code Status: Full Family Communication: no family at bedside Disposition Plan: To be determined   Consultants:  Clinical Education officer, museum (for alcohol detox assistance)  Procedures:  See below for x-ray report  Antibiotics:  None  HPI/Subjective: Feeling a whole better. Denies any further episodes of nausea vomiting. Has been able to tolerate clear liquid diets. Still with some epigastric discomfort but a whole lot better.  Objective: Filed Vitals:   05/02/14 1000  BP: 111/67  Pulse: 112  Temp: 99 F (37.2 C)  Resp: 20    Intake/Output Summary (Last 24 hours) at 05/02/14 1729 Last data filed at 05/02/14 1600  Gross per 24 hour  Intake 3492.09 ml  Output   2750 ml  Net 742.09 ml   Filed Weights   05/01/14 1525  Weight: 65.318 kg (144 lb)    Exam:   General:  Feeling better, denies any further episode of nausea or vomiting. Has been able to tolerate clear liquid diet and endorses improvement of her epigastric/mid abdomen pain  Cardiovascular: S1 and S2, mild tachycardia (sinus); no rubs, no gallops, no murmur  Respiratory: Clear to auscultation bilaterally  Abdomen: Soft, no guarding, no masses, no ascites; positive bowel sounds, no distention and just minimal tenderness with deep palpation  Musculoskeletal: No edema, cyanosis or clubbing.  Data Reviewed: Basic Metabolic Panel:  Recent Labs Lab 05/01/14 1203 05/01/14 1258 05/02/14 0432  NA 142  --  137  K 3.0*  --  2.9*  CL 102  --  99  CO2 17*  --  27  GLUCOSE 167*  --  117*  BUN 4*  --  5*  CREATININE 0.54  --  0.65  CALCIUM 7.7*  --  8.0*  MG  --  1.6  --   PHOS  --  2.8  --    Liver Function Tests:  Recent Labs Lab 05/01/14 1203  AST 103*  ALT 50*  ALKPHOS 102  BILITOT 2.2*   PROT 6.6  ALBUMIN 3.2*    Recent Labs Lab 05/01/14 1203  LIPASE 46   CBC:  Recent Labs Lab 05/01/14 1111 05/02/14 0432  WBC 6.7 7.5  NEUTROABS 5.1  --   HGB 16.0* 13.9  HCT 44.6 40.9  MCV 91.6 93.2  PLT 87* 46*     Studies: No results found.  Scheduled Meds: . calcium carbonate  1 tablet Oral BID WC  . citalopram  40 mg Oral Daily  . dextrose 5 % and 0.45% NaCl   Intravenous STAT  . folic acid  1 mg Oral Daily  . LORazepam  0-4 mg Intravenous Q6H   Followed by  . [START ON 05/03/2014] LORazepam  0-4 mg Intravenous Q12H  . multivitamin with minerals  1 tablet Oral Daily  . pantoprazole  40 mg Oral BID  . potassium chloride  40 mEq Oral BID  . sodium bicarbonate  650 mg Oral TID  . sodium chloride  3 mL Intravenous Q12H  . sucralfate  1 g Oral TID WC & HS  . thiamine  100 mg Oral Daily   Or  . thiamine  100 mg Intravenous Daily  . vitamin B-12  1,000 mcg Oral Daily   Continuous Infusions: . sodium chloride      Principal Problem:   Alcoholic ketoacidosis Active Problems:   GERD (gastroesophageal reflux disease)   Elevated TSH   Nausea & vomiting   Alcohol abuse   Hypokalemia   Hypocalcemia   Depression    Time spent: < 30 minutes    Barton Dubois  [facility] Pager [phone] If 7PM-7AM, please contact night-coverage at [email or web address], password [facility] 05/02/2014, 5:29 PM  LOS: 1 day

## 2014-05-02 NOTE — Progress Notes (Signed)
UR completed 

## 2014-05-03 LAB — BASIC METABOLIC PANEL
Anion gap: 8 (ref 5–15)
BUN: 6 mg/dL (ref 6–23)
CHLORIDE: 103 meq/L (ref 96–112)
CO2: 28 mEq/L (ref 19–32)
Calcium: 7.9 mg/dL — ABNORMAL LOW (ref 8.4–10.5)
Creatinine, Ser: 0.76 mg/dL (ref 0.50–1.10)
GFR calc non Af Amer: 90 mL/min — ABNORMAL LOW (ref >90)
Glucose, Bld: 91 mg/dL (ref 70–99)
POTASSIUM: 3.9 meq/L (ref 3.7–5.3)
Sodium: 139 mEq/L (ref 137–147)

## 2014-05-03 LAB — T4, FREE: Free T4: 0.9 ng/dL (ref 0.80–1.80)

## 2014-05-03 MED ORDER — ADULT MULTIVITAMIN W/MINERALS CH: 1.0000 | ORAL_TABLET | Freq: Every day | ORAL | Status: DC

## 2014-05-03 MED ORDER — POTASSIUM CHLORIDE CRYS ER 20 MEQ PO TBCR: 40.0000 meq | EXTENDED_RELEASE_TABLET | Freq: Two times a day (BID) | ORAL | Status: DC

## 2014-05-03 MED ORDER — PANTOPRAZOLE SODIUM 40 MG PO TBEC: 40.0000 mg | DELAYED_RELEASE_TABLET | Freq: Every day | ORAL | Status: DC

## 2014-05-03 MED ORDER — SUCRALFATE 1 GM/10ML PO SUSP: 1.0000 g | Freq: Three times a day (TID) | ORAL | Status: DC

## 2014-05-03 NOTE — Progress Notes (Signed)
Clinical Social Work  CSW spoke to patient about residential SA treatment yesterday, patient was agreeable then but today patient was more favorable of Intensive Outpatient Program (IOP). CSW inquired about the patient's sudden change in treatment plan and patient informed CSW of the worries she had with her husband's health and him being left alone at home. Patient wanted to know more about hospice for her husband and CSW directed patient to speak with husband's PCP. CSW informed patient that there may not be any IOP in the [place] area (where the patient lives), patient was agreeable to utilizing a [place] facility because distance wasn't too far and patient has own transportation.  Patient agreeable to go to IOP and assured CSW that she would make that committment.   CSW to follow up with IOP's that would accept patient's insurance and will inform patient of results.   [place], [place] [phone]

## 2014-05-03 NOTE — Discharge Summary (Signed)
Physician Discharge Summary  DELILIAH SPRANGER HEN:[id number] DOB: 07-Aug-1956 DOA: 05/01/2014  PCP: Horton Finer, MD  Admit date: 05/01/2014 Discharge date: 05/03/2014  Time spent: >30 minutes  Recommendations for Outpatient Follow-up:  BMET, phosphorus and magnesium to follow renal function and electrolytes trend  Discharge Diagnoses:  Principal Problem:   Alcoholic ketoacidosis Active Problems:   GERD (gastroesophageal reflux disease)   Elevated TSH   Nausea & vomiting   Alcohol abuse   Hypokalemia   Hypocalcemia   Depression   Discharge Condition: stable and improved. Discharge home. Will follow with PCP in 1 week   Filed Weights   05/01/14 1525  Weight: 65.318 kg (144 lb)    History of present illness:  58 y.o. female with a past medical history significant for gastroesophageal reflux disease/stomach ulcer, depression, hepatitis C and alcohol abuse; came to the hospital complaining of nausea, vomiting, abdominal pain and inability to keep anything down. Patient reports that for the last 4-5 days she has been drinking significantly (1-2 pint of liquor). Patient for the last 2 days has not been able to keep things down and do to ongoing nausea vomiting and abdominal discomfort. She described the pain as poor name in the middle of her chest and epigastric area and denies any hematemesis or melena. Patient denies also chest pain, shortness of breath, dysuria, diaphoresis, palpitations or any other acute complaints. She is looking to quit drinking and was planning to get herself into [facility] next week.   Hospital Course:  1-Alcoholic ketoacidosis: Patient with a history of alcohol dependency, has been drinking since 04/27/14 approximately 1-2 pint of liquor on daily basis. Patient reports that she has not been eating or drinking well since she had developed nausea and inability to keep things down and do to ongoing vomiting (the last 2 days prior to admission).  -Lactic  acidosis has now resolved -ask to take MV on daily basis to supplement folic and thiamine -cessation provided and patient will follow with IOP for support regarding mood and drinking problems; eventually will start detox program (she needs to save some money and coordinate husband care for that first)   2-hypokalemia/hypocalcemia: Secondary to chronic alcohol abuse/dependency.  -Potassium, Magnesium and phosphorus within normal limits at discharge -will continue maintenance supplementation  3-GERD (gastroesophageal reflux disease) with esophagitis:  -Symptoms are significantly improve and patient is tolerating diet -continue protonix and carafate   4-History of Elevated TSH: Was not taking any Synthroid medications prior to admission.  -TSH just mildly elevated  -Free T4 WNL  5-Nausea & vomiting: Without hematemesis or melena. Most likely secondary to alcoholic gastritis/esophagitis.  -Will continue using PRN antiemetics  -Continue the use of Protonix and Carafate  -Patient reports that the symptoms are significantly better   6-Alcohol abuse: Cessation counseling has been provided.  -Patient is looking to go to [facility] or any other outpatient detox facility at some point.  -will set up IOP   7-Depression: Continue Celexa.      Procedures: See below for x-ray reports  Consultations:  None   Discharge Exam: Filed Vitals:   05/03/14 0955  BP: 104/60  Pulse: 80  Temp: 98.4 F (36.9 C)  Resp: 19    General: Feeling better, denies any further episode of nausea or vomiting. Has been able to tolerate diet and endorses significant improvement of her epigastric/mid abdomen pain  Cardiovascular: S1 and S2, RRR; no rubs, no gallops, no murmur  Respiratory: Clear to auscultation bilaterally  Abdomen: Soft, no  guarding, no masses, no ascites; positive bowel sounds, no distention and just minimal tenderness with deep palpation  Musculoskeletal: No edema, cyanosis or  clubbing.  Discharge Instructions You were cared for by a hospitalist during your hospital stay. If you have any questions about your discharge medications or the care you received while you were in the hospital after you are discharged, you can call the unit and asked to speak with the hospitalist on call if the hospitalist that took care of you is not available. Once you are discharged, your primary care physician will handle any further medical issues. Please note that NO REFILLS for any discharge medications will be authorized once you are discharged, as it is imperative that you return to your primary care physician (or establish a relationship with a primary care physician if you do not have one) for your aftercare needs so that they can reassess your need for medications and monitor your lab values.  Discharge Instructions   Discharge instructions    Complete by:  As directed   Take medications as prescribed Stop alcohol use Keep yourself well hydrated            Medication List         ALPRAZolam 0.25 MG tablet  Commonly known as:  XANAX  Take 0.25 mg by mouth 3 (three) times daily as needed for anxiety.     citalopram 40 MG tablet  Commonly known as:  CELEXA  Take 40 mg by mouth daily.     multivitamin with minerals Tabs tablet  Take 1 tablet by mouth daily.     pantoprazole 40 MG tablet  Commonly known as:  PROTONIX  Take 1 tablet (40 mg total) by mouth daily.     potassium chloride SA 20 MEQ tablet  Commonly known as:  K-DUR,KLOR-CON  Take 2 tablets (40 mEq total) by mouth 2 (two) times daily.     sucralfate 1 GM/10ML suspension  Commonly known as:  CARAFATE  Take 10 mLs (1 g total) by mouth 4 (four) times daily -  with meals and at bedtime.     VITAMIN B 12 PO  Take 1,000 mcg by mouth daily.       Allergies  Allergen Reactions  . Adhesive [Tape] Other (See Comments)    Thin skin  . Oxycodone Hcl Hives       Follow-up Information   Follow up with  Horton Finer, MD In 1 week.   Specialty:  Internal Medicine   Contact information:   [address], [place] [address] [phone]       The results of significant diagnostics from this hospitalization (including imaging, microbiology, ancillary and laboratory) are listed below for reference.    Significant Diagnostic Studies: No results found.  Labs: Basic Metabolic Panel:  Recent Labs Lab 05/01/14 1203 05/01/14 1258 05/02/14 0432 05/03/14 0431  NA 142  --  137 139  K 3.0*  --  2.9* 3.9  CL 102  --  99 103  CO2 17*  --  27 28  GLUCOSE 167*  --  117* 91  BUN 4*  --  5* 6  CREATININE 0.54  --  0.65 0.76  CALCIUM 7.7*  --  8.0* 7.9*  MG  --  1.6  --   --   PHOS  --  2.8  --   --    Liver Function Tests:  Recent Labs Lab 05/01/14 1203  AST 103*  ALT 50*  ALKPHOS 102  BILITOT 2.2*  PROT 6.6  ALBUMIN 3.2*    Recent Labs Lab 05/01/14 1203  LIPASE 46   CBC:  Recent Labs Lab 05/01/14 1111 05/02/14 0432  WBC 6.7 7.5  NEUTROABS 5.1  --   HGB 16.0* 13.9  HCT 44.6 40.9  MCV 91.6 93.2  PLT 87* 46*    Signed:  Barton Dubois  [facility] 05/03/2014, 12:20 PM

## 2014-05-03 NOTE — Progress Notes (Signed)
Clinical Social Work  CSW followed up with [facility] program who is agreeable to accept patient for services. CSW spoke with [name] who manages IOP and reports that patient can call and speak with her to schedule assessment. CSW met with patient at bedside and provided her with this information. Patient agreeable to IOP and reports no further needs. CSW updated MD re: patient's plans at DC.  [place], [place] [phone]

## 2014-05-04 NOTE — Progress Notes (Signed)
Ativan 1mg  wasted in sink with Carolyn Stare, RN. Unable to document in pyxis at this time.

## 2014-05-12 ENCOUNTER — Telehealth: Payer: Self-pay | Admitting: *Deleted

## 2014-05-12 NOTE — Telephone Encounter (Signed)
Called the patient to give her an appt for the [facility]. She reports that she just got out of the hospital and is trying to get into rehab. Advised her will let Dr Baxter Flattery know. The patient advised she is still interested in getting the scan and treatment. Advised her will call and get it scheduled and call her back.  Called the patient and gave her an appt for 05/24/14 at 8 am at [facility]. Advised to arrive 15 mins early and NPO midnight.

## 2014-05-17 ENCOUNTER — Encounter (HOSPITAL_COMMUNITY): Payer: Self-pay | Admitting: Psychology

## 2014-05-18 ENCOUNTER — Other Ambulatory Visit (HOSPITAL_COMMUNITY): Payer: No Typology Code available for payment source | Attending: Psychiatry

## 2014-05-18 DIAGNOSIS — Z609 Problem related to social environment, unspecified: Secondary | ICD-10-CM | POA: Insufficient documentation

## 2014-05-18 DIAGNOSIS — F332 Major depressive disorder, recurrent severe without psychotic features: Secondary | ICD-10-CM | POA: Insufficient documentation

## 2014-05-18 DIAGNOSIS — K219 Gastro-esophageal reflux disease without esophagitis: Secondary | ICD-10-CM | POA: Insufficient documentation

## 2014-05-18 DIAGNOSIS — F1020 Alcohol dependence, uncomplicated: Secondary | ICD-10-CM | POA: Insufficient documentation

## 2014-05-18 DIAGNOSIS — F411 Generalized anxiety disorder: Secondary | ICD-10-CM | POA: Insufficient documentation

## 2014-05-18 DIAGNOSIS — B1920 Unspecified viral hepatitis C without hepatic coma: Secondary | ICD-10-CM | POA: Insufficient documentation

## 2014-05-18 DIAGNOSIS — F1320 Sedative, hypnotic or anxiolytic dependence, uncomplicated: Secondary | ICD-10-CM | POA: Insufficient documentation
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
## 2014-05-20 ENCOUNTER — Other Ambulatory Visit (HOSPITAL_COMMUNITY): Payer: No Typology Code available for payment source | Admitting: Psychology

## 2014-05-20 DIAGNOSIS — B1920 Unspecified viral hepatitis C without hepatic coma: Secondary | ICD-10-CM | POA: Diagnosis not present

## 2014-05-20 DIAGNOSIS — Z609 Problem related to social environment, unspecified: Secondary | ICD-10-CM | POA: Diagnosis not present

## 2014-05-20 DIAGNOSIS — F1320 Sedative, hypnotic or anxiolytic dependence, uncomplicated: Secondary | ICD-10-CM | POA: Diagnosis not present

## 2014-05-20 DIAGNOSIS — F411 Generalized anxiety disorder: Secondary | ICD-10-CM | POA: Diagnosis not present

## 2014-05-20 DIAGNOSIS — K219 Gastro-esophageal reflux disease without esophagitis: Secondary | ICD-10-CM | POA: Diagnosis not present

## 2014-05-20 DIAGNOSIS — F332 Major depressive disorder, recurrent severe without psychotic features: Secondary | ICD-10-CM | POA: Diagnosis not present

## 2014-05-20 DIAGNOSIS — F1020 Alcohol dependence, uncomplicated: Secondary | ICD-10-CM | POA: Diagnosis not present

## 2014-05-23 ENCOUNTER — Other Ambulatory Visit (HOSPITAL_COMMUNITY): Payer: No Typology Code available for payment source | Admitting: Psychology

## 2014-05-23 DIAGNOSIS — B182 Chronic viral hepatitis C: Secondary | ICD-10-CM

## 2014-05-23 DIAGNOSIS — F32A Depression, unspecified: Secondary | ICD-10-CM

## 2014-05-23 DIAGNOSIS — F1020 Alcohol dependence, uncomplicated: Secondary | ICD-10-CM | POA: Diagnosis not present

## 2014-05-23 DIAGNOSIS — F329 Major depressive disorder, single episode, unspecified: Secondary | ICD-10-CM

## 2014-05-23 DIAGNOSIS — F1220 Cannabis dependence, uncomplicated: Secondary | ICD-10-CM

## 2014-05-23 DIAGNOSIS — F10230 Alcohol dependence with withdrawal, uncomplicated: Secondary | ICD-10-CM

## 2014-05-24 ENCOUNTER — Ambulatory Visit (HOSPITAL_COMMUNITY)
Admission: RE | Admit: 2014-05-24 | Discharge: 2014-05-24 | Disposition: A | Discharge status: Home / Self Care | Payer: Medicare Other | Source: Ambulatory Visit | Attending: Internal Medicine | Admitting: Internal Medicine

## 2014-05-24 DIAGNOSIS — K8020 Calculus of gallbladder without cholecystitis without obstruction: Secondary | ICD-10-CM | POA: Insufficient documentation

## 2014-05-24 DIAGNOSIS — B182 Chronic viral hepatitis C: Secondary | ICD-10-CM | POA: Diagnosis present

## 2014-05-24 DIAGNOSIS — Q619 Cystic kidney disease, unspecified: Secondary | ICD-10-CM | POA: Diagnosis not present

## 2014-05-25 ENCOUNTER — Encounter (HOSPITAL_COMMUNITY): Payer: Self-pay | Admitting: Psychology

## 2014-05-25 ENCOUNTER — Other Ambulatory Visit (HOSPITAL_COMMUNITY): Payer: No Typology Code available for payment source | Admitting: Psychology

## 2014-05-25 ENCOUNTER — Encounter: Payer: Self-pay | Admitting: Internal Medicine

## 2014-05-25 ENCOUNTER — Ambulatory Visit (INDEPENDENT_AMBULATORY_CARE_PROVIDER_SITE_OTHER): Payer: Medicare Other | Admitting: Internal Medicine

## 2014-05-25 VITALS — BP 116/76 | HR 68 | Temp 98.4°F | Ht 62.5 in | Wt 145.0 lb

## 2014-05-25 DIAGNOSIS — G40802 Other epilepsy, not intractable, without status epilepticus: Secondary | ICD-10-CM

## 2014-05-25 DIAGNOSIS — F1320 Sedative, hypnotic or anxiolytic dependence, uncomplicated: Secondary | ICD-10-CM

## 2014-05-25 DIAGNOSIS — F1020 Alcohol dependence, uncomplicated: Secondary | ICD-10-CM | POA: Diagnosis not present

## 2014-05-25 DIAGNOSIS — Z23 Encounter for immunization: Secondary | ICD-10-CM

## 2014-05-25 DIAGNOSIS — F1310 Sedative, hypnotic or anxiolytic abuse, uncomplicated: Secondary | ICD-10-CM
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
## 2014-05-25 NOTE — Progress Notes (Signed)
Patient ID: Susan Francis, female   DOB: 06/09/1956, 58 y.o.   MRN: [id number] S-Called to CD IOOP group  room for "emergency situation"-Pt reported she had had 2 seizures while sitting in group (?abscence) due having no xanax for 2 days.Shehad represented as having taper rx for xanax from her MD when she enrolled in program. O-Pt is seen to alert oriented and adamnantly refusing to go to ED.[facility] Monthly rx for xanax 0.5 mg # 90 TID 0 refills-last reported rx 04/21/14 Dr Nehemiah Settle A- Reported hx of "seizure" with no visible seizure activity.Reported out of xanax for 2 days      Alcohol dependence/Xanax abuse (taking with alcohol) ? Dependency P-Recommend call 911 and take Pt  to ED for evaluation and consideration of medical detox

## 2014-05-25 NOTE — Progress Notes (Signed)
Subjective:    Patient ID: Susan Francis, female    DOB: 10-31-1955, 58 y.o.   MRN: [id number]  HPI crhonic hepatitis c with cirrhosis, since we last saw her then she was admitted for alcoholic ketoacidosis then she was referred to rehab and mental health counseling. Her  Last drink 9/15. Has been going behavioral health three times per week and going to AA twice a week. She is doing well with abstaining from alcohol use.  Allergies  Allergen Reactions  . Adhesive [Tape] Other (See Comments)    Thin skin  . Oxycodone Hcl Hives   Current Outpatient Prescriptions on File Prior to Visit  Medication Sig Dispense Refill  . ALPRAZolam (XANAX) 0.25 MG tablet Take 0.25 mg by mouth 3 (three) times daily as needed for anxiety.      . citalopram (CELEXA) 40 MG tablet Take 40 mg by mouth daily.      . Cyanocobalamin (VITAMIN B 12 PO) Take 1,000 mcg by mouth daily.       . Multiple Vitamin (MULTIVITAMIN WITH MINERALS) TABS tablet Take 1 tablet by mouth daily.      . pantoprazole (PROTONIX) 40 MG tablet Take 1 tablet (40 mg total) by mouth daily.  30 tablet  1  . potassium chloride SA (K-DUR,KLOR-CON) 20 MEQ tablet Take 2 tablets (40 mEq total) by mouth 2 (two) times daily.  120 tablet  0  . sucralfate (CARAFATE) 1 GM/10ML suspension Take 10 mLs (1 g total) by mouth 4 (four) times daily -  with meals and at bedtime.  420 mL  0   No current facility-administered medications on file prior to visit.   Active Ambulatory Problems    Diagnosis Date Noted  . Seizure   . Hepatitis C   . GERD (gastroesophageal reflux disease) 06/11/2013  . Alcohol dependence 06/11/2013  . Uterine mass 06/11/2013  . Colitis 06/11/2013  . Prolonged Q-T interval on ECG 06/11/2013  . Troponin level elevated 06/11/2013  . Adnexal mass 06/12/2013  . Cholelithiasis 06/12/2013  . Elevated TSH 06/12/2013  . Nonspecific abnormal electrocardiogram (ECG) (EKG)   . Alcoholic ketoacidosis 61/44/3154  . Nausea & vomiting  05/01/2014  . Alcohol abuse 05/01/2014  . Hypokalemia 05/01/2014  . Hypocalcemia 05/01/2014  . Depression 05/01/2014   Resolved Ambulatory Problems    Diagnosis Date Noted  . Atypical chest pain 06/10/2013  . Noninfectious gastroenteritis and colitis 06/11/2013   Past Medical History  Diagnosis Date  . Stomach ulcer   . Anxiety      Review of Systems Review of Systems  Constitutional: Negative for fever, chills, diaphoresis, activity change, appetite change, fatigue and unexpected weight change.  HENT: Negative for congestion, sore throat, rhinorrhea, sneezing, trouble swallowing and sinus pressure.  Eyes: Negative for photophobia and visual disturbance.  Respiratory: Negative for cough, chest tightness, shortness of breath, wheezing and stridor.  Cardiovascular: Negative for chest pain, palpitations and leg swelling.  Gastrointestinal: Negative for nausea, vomiting, abdominal pain, diarrhea, constipation, blood in stool, abdominal distention and anal bleeding.  Genitourinary: Negative for dysuria, hematuria, flank pain and difficulty urinating.  Musculoskeletal: Negative for myalgias, back pain, joint swelling, arthralgias and gait problem.  Skin: Negative for color change, pallor, rash and wound.  Neurological: Negative for dizziness, tremors, weakness and light-headedness.  Hematological: Negative for adenopathy. Does not bruise/bleed easily.  Psychiatric/Behavioral: Negative for behavioral problems, confusion, sleep disturbance, dysphoric mood, decreased concentration and agitation.       Objective:   Physical Exam  BP 116/76  Pulse 68  Temp(Src) 98.4 F (36.9 C) (Oral)  Ht 5' 2.5" (1.588 m)  Wt 145 lb (65.772 kg)  BMI 26.08 kg/m2  No exam     Assessment & Plan:  Health maintenance = hep b #2  Hep c  = will apply for [facility]  Alcohol abuse = currently 2 wk into sobriety. Applauded her attendance and dedication to cousneling and [place] meetings.   rtc in 2  month

## 2014-05-26 ENCOUNTER — Telehealth (HOSPITAL_COMMUNITY): Payer: Self-pay | Admitting: Psychology

## 2014-05-26 ENCOUNTER — Encounter (HOSPITAL_COMMUNITY): Payer: Self-pay | Admitting: Psychology
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
## 2014-05-27 ENCOUNTER — Other Ambulatory Visit (HOSPITAL_COMMUNITY): Payer: No Typology Code available for payment source

## 2014-05-27 ENCOUNTER — Encounter (HOSPITAL_COMMUNITY): Payer: Self-pay

## 2014-05-27 ENCOUNTER — Emergency Department (HOSPITAL_COMMUNITY)
Admission: EM | Admit: 2014-05-27 | Discharge: 2014-05-28 | Disposition: A | Discharge status: Home / Self Care | Payer: No Typology Code available for payment source | Attending: Emergency Medicine | Admitting: Emergency Medicine

## 2014-05-27 ENCOUNTER — Encounter (HOSPITAL_COMMUNITY): Payer: Self-pay | Admitting: Emergency Medicine

## 2014-05-27 DIAGNOSIS — F1210 Cannabis abuse, uncomplicated: Secondary | ICD-10-CM | POA: Insufficient documentation

## 2014-05-27 DIAGNOSIS — Z79899 Other long term (current) drug therapy: Secondary | ICD-10-CM | POA: Diagnosis not present

## 2014-05-27 DIAGNOSIS — F17200 Nicotine dependence, unspecified, uncomplicated: Secondary | ICD-10-CM | POA: Insufficient documentation

## 2014-05-27 DIAGNOSIS — F1910 Other psychoactive substance abuse, uncomplicated: Secondary | ICD-10-CM

## 2014-05-27 DIAGNOSIS — Z008 Encounter for other general examination: Secondary | ICD-10-CM | POA: Insufficient documentation

## 2014-05-27 DIAGNOSIS — F1310 Sedative, hypnotic or anxiolytic abuse, uncomplicated: Secondary | ICD-10-CM | POA: Diagnosis not present

## 2014-05-27 DIAGNOSIS — K259 Gastric ulcer, unspecified as acute or chronic, without hemorrhage or perforation: Secondary | ICD-10-CM | POA: Insufficient documentation

## 2014-05-27 DIAGNOSIS — F411 Generalized anxiety disorder: Secondary | ICD-10-CM | POA: Insufficient documentation

## 2014-05-27 DIAGNOSIS — Z8619 Personal history of other infectious and parasitic diseases: Secondary | ICD-10-CM | POA: Insufficient documentation

## 2014-05-27 DIAGNOSIS — K219 Gastro-esophageal reflux disease without esophagitis: Secondary | ICD-10-CM | POA: Insufficient documentation

## 2014-05-27 LAB — COMPREHENSIVE METABOLIC PANEL
ALBUMIN: 3.3 g/dL — AB (ref 3.5–5.2)
ALK PHOS: 126 U/L — AB (ref 39–117)
ALT: 76 U/L — AB (ref 0–35)
AST: 136 U/L — AB (ref 0–37)
Anion gap: 11 (ref 5–15)
BILIRUBIN TOTAL: 1.5 mg/dL — AB (ref 0.3–1.2)
BUN: 8 mg/dL (ref 6–23)
CHLORIDE: 98 meq/L (ref 96–112)
CO2: 28 mEq/L (ref 19–32)
Calcium: 9.2 mg/dL (ref 8.4–10.5)
Creatinine, Ser: 0.84 mg/dL (ref 0.50–1.10)
GFR calc Af Amer: 86 mL/min — ABNORMAL LOW (ref >90)
GFR, EST NON AFRICAN AMERICAN: 74 mL/min — AB (ref >90)
Glucose, Bld: 76 mg/dL (ref 70–99)
POTASSIUM: 3.6 meq/L — AB (ref 3.7–5.3)
Sodium: 137 mEq/L (ref 137–147)
Total Protein: 7 g/dL (ref 6.0–8.3)

## 2014-05-27 LAB — RAPID URINE DRUG SCREEN, HOSP PERFORMED
AMPHETAMINES: NOT DETECTED
Barbiturates: NOT DETECTED
Benzodiazepines: POSITIVE — AB
Cocaine: NOT DETECTED
Opiates: NOT DETECTED
Tetrahydrocannabinol: POSITIVE — AB

## 2014-05-27 LAB — CBC
HCT: 41.2 % (ref 36.0–46.0)
Hemoglobin: 14.2 g/dL (ref 12.0–15.0)
MCH: 32.5 pg (ref 26.0–34.0)
MCHC: 34.5 g/dL (ref 30.0–36.0)
MCV: 94.3 fL (ref 78.0–100.0)
PLATELETS: 54 10*3/uL — AB (ref 150–400)
RBC: 4.37 MIL/uL (ref 3.87–5.11)
RDW: 15.2 % (ref 11.5–15.5)
WBC: 4.6 10*3/uL (ref 4.0–10.5)

## 2014-05-27 LAB — SALICYLATE LEVEL: Salicylate Lvl: <2 mg/dL — ABNORMAL LOW (ref 2.8–20.0)

## 2014-05-27 LAB — ETHANOL: Alcohol, Ethyl (B): <11 mg/dL (ref 0–11)

## 2014-05-27 LAB — ACETAMINOPHEN LEVEL

## 2014-05-27 MED ORDER — IBUPROFEN 200 MG PO TABS: 600.0000 mg | ORAL_TABLET | Freq: Three times a day (TID) | ORAL | Status: DC | PRN

## 2014-05-27 MED ORDER — THIAMINE HCL 100 MG/ML IJ SOLN: 100.0000 mg | Freq: Every day | INTRAMUSCULAR | Status: DC

## 2014-05-27 MED ORDER — LORAZEPAM 2 MG/ML IJ SOLN: 0.0000 mg | Freq: Two times a day (BID) | INTRAMUSCULAR | Status: DC

## 2014-05-27 MED ORDER — ONDANSETRON HCL 4 MG PO TABS: 4.0000 mg | ORAL_TABLET | Freq: Three times a day (TID) | ORAL | Status: DC | PRN

## 2014-05-27 MED ORDER — VITAMIN B-1 100 MG PO TABS
100.0000 mg | ORAL_TABLET | Freq: Every day | ORAL | Status: DC
  Administered 2014-05-28: 100 mg via ORAL
  Filled 2014-05-27: qty 1

## 2014-05-27 MED ORDER — NICOTINE 21 MG/24HR TD PT24: 21.0000 mg | MEDICATED_PATCH | Freq: Every day | TRANSDERMAL | Status: DC

## 2014-05-27 MED ORDER — LORAZEPAM 2 MG/ML IJ SOLN: 0.0000 mg | Freq: Four times a day (QID) | INTRAMUSCULAR | Status: DC

## 2014-05-27 MED ORDER — ACETAMINOPHEN 325 MG PO TABS: 650.0000 mg | ORAL_TABLET | ORAL | Status: DC | PRN

## 2014-05-27 MED ORDER — LORAZEPAM 1 MG PO TABS: 0.0000 mg | ORAL_TABLET | Freq: Four times a day (QID) | ORAL | Status: DC

## 2014-05-27 MED ORDER — LORAZEPAM 1 MG PO TABS: 0.0000 mg | ORAL_TABLET | Freq: Two times a day (BID) | ORAL | Status: DC
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
## 2014-05-27 NOTE — ED Notes (Signed)
Pt reports she wants to go to [facility] for detox from xanax and alcohol. Last dose xanax this am. Last drink ETOH September 15. No SI/HI.

## 2014-05-27 NOTE — Progress Notes (Addendum)
    Daily Group Progress Note  Program: CD-IOP   Group Time: 1-2:30 pm  Participation Level: Active  Behavioral Response: Sharing  Type of Therapy: Process Group  Topic: After checking in, the first part of group was spent sharing what had been going on for group members since our last meeting.  Group members checked in about meetings they had been going to, challenges they had faced, and provided feedback for other members.  The group then transitioned into psychoeducation about boundaries.  The concept of boundaries and why they are important was discussed.  Drug tests were also returned to patients today.  Group Time: 2:45- 4pm  Participation Level: Active  Behavioral Response: Sharing  Type of Therapy: Psycho-education Group  Topic: The second part of group was spent finishing the conversation about boundaries, and ended with a group member's graduation from the program.  Group members identified why they are afraid of setting boundaries, and were able to talk about people in their lives they need to set boundaries with.  Prior to the graduation, the counselor provided information about dopamine, its role in the brain, and how addiction affects dopamine levels in the brain.  Psychoeducation on boundaries will continue in the next group meeting.   Summary: The patient stated that she had a liver scan yesterday and went in to meet with her Hepatitis C doctor today.  She stated that she has cirrhosis on the edge of her liver, and was accepted into the Hepatitis C treatment program.  The patient will begin treatments on December 3rd; however she is worried about whether or not insurance will cover it. She reported that she has been out of Xanax for a couple of days and was concerned about possibly having a seizure.  She became very anxious and upset, but calmed down as the group went on.  [name] is 9/21.   Family Program: Family present? No   Name of family  member(s):   UDS collected: No Results:  AA/NA attended?: YesMonday  Sponsor?: No   [facility]

## 2014-05-28 DIAGNOSIS — F329 Major depressive disorder, single episode, unspecified: Secondary | ICD-10-CM

## 2014-05-28 DIAGNOSIS — F3289 Other specified depressive episodes: Secondary | ICD-10-CM

## 2014-05-28 DIAGNOSIS — F411 Generalized anxiety disorder: Secondary | ICD-10-CM

## 2014-05-28 DIAGNOSIS — F1020 Alcohol dependence, uncomplicated: Secondary | ICD-10-CM

## 2014-05-28 DIAGNOSIS — F1320 Sedative, hypnotic or anxiolytic dependence, uncomplicated: Secondary | ICD-10-CM

## 2014-05-28 NOTE — ED Notes (Signed)
Pt's belongings place in locker # 83.

## 2014-05-28 NOTE — ED Provider Notes (Signed)
Medical screening examination/treatment/procedure(s) were conducted as a shared visit with non-physician practitioner(s) and myself.  I personally evaluated the patient during the encounter.   EKG Interpretation None       Orlie Dakin, MD 05/28/14 0126

## 2014-05-28 NOTE — BH Assessment (Signed)
Clayborne Dana, [facility], confirmed adult unit is at capacity. Contacted the following facilities for placement:   BED AVAILABLE, FAXED CLINICAL INFORMATION:  [facility], per [facility], per [facility], per [facility]   AT CAPACITY:  [facility], per [facility], per [facility], per [facility], per [facility], per [facility], per [facility], per [facility], per [facility], per [facility], per [facility], per [facility], per [facility] NO RESPONSE:  [facility]   PT DECLINED:  [facility] (substance abuse)    Orpah Greek Anson Fret, [facility], [facility]  Triage Specialist  [phone]

## 2014-05-28 NOTE — BH Assessment (Signed)
Received call from Story at [facility] saying Pt is declined due to not meeting their criteria for inpatient detox.  Orpah Greek Rosana Hoes, [facility] Triage Specialist [phone]

## 2014-05-28 NOTE — Discharge Summary (Signed)
Physician Discharge Summary Note  Patient:  Susan Francis is an 58 y.o., female MRN:  [id number] DOB:  [birth year] Patient phone:  [phone] (home)  Patient address:   [place] [address],  Total Time spent with patient: 30 minutes  Date of Admission:  05/27/2014 Date of Discharge: 05/28/2014  Reason for Admission:  Patient was seen in the ER seeking detox treatment off Alcohol and xanax.  She is already on the tapering process.  Discharge Diagnoses: Active Problems:   * No active hospital problems. *   Psychiatric Specialty Exam: Physical Exam  ROS  Blood pressure 94/62, pulse 61, temperature 98.4 F (36.9 C), temperature source Oral, resp. rate 20, SpO2 97.00%.There is no weight on file to calculate BMI.  General Appearance: Casual  Eye Contact::  Good  Speech:  Clear and Coherent and Normal Rate  Volume:  Normal  Mood:  Euthymic  Affect:  Congruent  Thought Process:  Coherent, Goal Directed and Intact  Orientation:  Full (Time, Place, and Person)  Thought Content:  WDL  Suicidal Thoughts:  No  Homicidal Thoughts:  No  Memory:  Immediate;   Good Recent;   Good Remote;   Good  Judgement:  Fair  Insight:  Fair  Psychomotor Activity:  Normal  Concentration:  Good  Recall:  NA  Fund of Knowledge:Good  Language: Good  Akathisia:  NA  Handed:  Right  AIMS (if indicated):     Assets:  Desire for Improvement  Sleep:       Past Psychiatric History: Diagnosis:  Hospitalizations:  Outpatient Care:  Substance Abuse Care:  Self-Mutilation:  Suicidal Attempts:  Violent Behaviors:   Musculoskeletal: Strength & Muscle Tone: within normal limits Gait & Station: normal Patient leans: N/A  DSM5:   Substance/Addictive Disorders:  Alcohol Related Disorder - Severe (303.90), XANAX DEPENDENCE Depressive Disorders:  Major Depressive Disorder - Moderate (296.22)  Axis Diagnosis:   AXIS I:  Anxiety Disorder NOS, Major Depression, Recurrent  severe and Xanax dependence AXIS II:  Deferred AXIS III:   Past Medical History  Diagnosis Date  . Stomach ulcer   . Nonspecific abnormal electrocardiogram (ECG) (EKG)   . Anxiety   . GERD (gastroesophageal reflux disease)   . Seizure     last seizure 4 yrs ago-no recent meds now  . Hepatitis C     dx. '03 -past hx. 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   AXIS IV:  other psychosocial or environmental problems and problems related to social environment AXIS V:  61-70 mild symptoms  Level of Care:  IOP  Hospital Course:  Admitted over night in the ER seeking detox treatment from Alcohol and Xanax.  Patient was recently diagnosed with Hep -C.  Patient denied any withdrawal symptom.  She was alert and oriented x3.  Patient is already on the tapering dose of Xanax and is participating in CD-IOP at [facility].  She was   Advised to continue tapering her Xanax and instructed not to stop abruptly.  Patient was instructed to come back to the ER if she starts having withdrawal symptoms especially seizures.  Discharge Vitals:   Blood pressure 94/62, pulse 61, temperature 98.4 F (36.9 C), temperature source Oral, resp. rate 20, SpO2 97.00%. There is no weight on file to calculate BMI. Lab Results:   Results for orders placed during the hospital encounter of 05/27/14 (from the past 72 hour(s))  URINE RAPID DRUG SCREEN (HOSP PERFORMED)     Status: Abnormal  Collection Time    05/27/14  6:41 PM      Result Value Ref Range   Opiates NONE DETECTED  NONE DETECTED   Cocaine NONE DETECTED  NONE DETECTED   Benzodiazepines POSITIVE (*) NONE DETECTED   Amphetamines NONE DETECTED  NONE DETECTED   Tetrahydrocannabinol POSITIVE (*) NONE DETECTED   Barbiturates NONE DETECTED  NONE DETECTED   Comment:            DRUG SCREEN FOR MEDICAL PURPOSES     ONLY.  IF CONFIRMATION IS NEEDED     FOR ANY PURPOSE, NOTIFY LAB     WITHIN 5 DAYS.                LOWEST DETECTABLE LIMITS     FOR URINE DRUG SCREEN     Drug Class        Cutoff (ng/mL)     Amphetamine      1000     Barbiturate      200     Benzodiazepine   301     Tricyclics       601     Opiates          300     Cocaine          300     THC              50  ACETAMINOPHEN LEVEL     Status: None   Collection Time    05/27/14  6:52 PM      Result Value Ref Range   Acetaminophen (Tylenol), Serum <15.0  10 - 30 ug/mL   Comment:            THERAPEUTIC CONCENTRATIONS VARY     SIGNIFICANTLY. A RANGE OF 10-30     ug/mL MAY BE AN EFFECTIVE     CONCENTRATION FOR MANY PATIENTS.     HOWEVER, SOME ARE BEST TREATED     AT CONCENTRATIONS OUTSIDE THIS     RANGE.     ACETAMINOPHEN CONCENTRATIONS     >150 ug/mL AT 4 HOURS AFTER     INGESTION AND >50 ug/mL AT 12     HOURS AFTER INGESTION ARE     OFTEN ASSOCIATED WITH TOXIC     REACTIONS.  CBC     Status: Abnormal   Collection Time    05/27/14  6:52 PM      Result Value Ref Range   WBC 4.6  4.0 - 10.5 K/uL   RBC 4.37  3.87 - 5.11 MIL/uL   Hemoglobin 14.2  12.0 - 15.0 g/dL   HCT 41.2  36.0 - 46.0 %   MCV 94.3  78.0 - 100.0 fL   MCH 32.5  26.0 - 34.0 pg   MCHC 34.5  30.0 - 36.0 g/dL   RDW 15.2  11.5 - 15.5 %   Platelets 54 (*) 150 - 400 K/uL   Comment: SPECIMEN CHECKED FOR CLOTS     REPEATED TO VERIFY     PLATELET COUNT CONFIRMED BY SMEAR  COMPREHENSIVE METABOLIC PANEL     Status: Abnormal   Collection Time    05/27/14  6:52 PM      Result Value Ref Range   Sodium 137  137 - 147 mEq/L   Potassium 3.6 (*) 3.7 - 5.3 mEq/L   Chloride 98  96 - 112 mEq/L   CO2 28  19 - 32 mEq/L   Glucose, Bld 76  70 - 99  mg/dL   BUN 8  6 - 23 mg/dL   Creatinine, Ser 0.84  0.50 - 1.10 mg/dL   Calcium 9.2  8.4 - 10.5 mg/dL   Total Protein 7.0  6.0 - 8.3 g/dL   Albumin 3.3 (*) 3.5 - 5.2 g/dL   AST 136 (*) 0 - 37 U/L   ALT 76 (*) 0 - 35 U/L   Alkaline Phosphatase 126 (*) 39 - 117 U/L   Total Bilirubin 1.5 (*) 0.3 - 1.2 mg/dL   GFR calc non Af Amer 74 (*) >90 mL/min   GFR calc Af Amer 86 (*) >90 mL/min   Comment:  (NOTE)     The eGFR has been calculated using the CKD EPI equation.     This calculation has not been validated in all clinical situations.     eGFR's persistently <90 mL/min signify possible Chronic Kidney     Disease.   Anion gap 11  5 - 15  ETHANOL     Status: None   Collection Time    05/27/14  6:52 PM      Result Value Ref Range   Alcohol, Ethyl (B) <11  0 - 11 mg/dL   Comment:            LOWEST DETECTABLE LIMIT FOR     SERUM ALCOHOL IS 11 mg/dL     FOR MEDICAL PURPOSES ONLY  SALICYLATE LEVEL     Status: Abnormal   Collection Time    05/27/14  6:52 PM      Result Value Ref Range   Salicylate Lvl <0.1 (*) 2.8 - 20.0 mg/dL    Physical Findings: AIMS:  , ,  ,  ,    CIWA:  CIWA-Ar Total: 1 COWS:  COWS Total Score: 0  Psychiatric Specialty Exam: See Psychiatric Specialty Exam and Suicide Risk Assessment completed by Attending Physician prior to discharge.  Discharge destination:  Home  Is patient on multiple antipsychotic therapies at discharge:  No   Has Patient had three or more failed trials of antipsychotic monotherapy by history:  No  Recommended Plan for Multiple Antipsychotic Therapies: NA  Discharge Instructions   Diet - low sodium heart healthy    Complete by:  As directed      Discharge instructions    Complete by:  As directed   Continue attending CD-IOP, Continue tapering down your Xanax     Increase activity slowly    Complete by:  As directed             Medication List       Indication   ALPRAZolam 0.25 MG tablet  Commonly known as:  XANAX  Take 0.25 mg by mouth 3 (three) times daily as needed for anxiety.      citalopram 40 MG tablet  Commonly known as:  CELEXA  Take 40 mg by mouth daily.      multivitamin with minerals Tabs tablet  Take 1 tablet by mouth daily.      pantoprazole 40 MG tablet  Commonly known as:  PROTONIX  Take 1 tablet (40 mg total) by mouth daily.      potassium chloride SA 20 MEQ tablet  Commonly known as:   K-DUR,KLOR-CON  Take 2 tablets (40 mEq total) by mouth 2 (two) times daily.      sucralfate 1 GM/10ML suspension  Commonly known as:  CARAFATE  Take 10 mLs (1 g total) by mouth 4 (four) times daily -  with meals and  at bedtime.      VITAMIN B 12 PO  Take 1,000 mcg by mouth daily.          Follow-up recommendations:  Activity:  As tolerated Diet:  regular  Comments:  Patient is discharged home to continue participating in CD-IOP  Total Discharge Time:  Greater than 30 minutes.  SignedDelfin Gant   PMHNP-BC 05/28/2014, 2:44 PM  I have personally seen the patient and agreed with the findings and involved in the treatment plan. Berniece Andreas, MD

## 2014-05-28 NOTE — ED Notes (Signed)
Pt did not stay for vital signs
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
## 2014-05-28 NOTE — ED Notes (Signed)
Pt upset about delay in papers to d/c home.  MD notified and charge spoke with MD.  Charge told delay in papers d/t multiple consults at this time.  Pt notified of delay
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
## 2014-05-28 NOTE — Progress Notes (Unsigned)
Susan Francis is a 58 y.o. female patient ***. CD-IOP: this patient was referred to [facility] ED to seek a medical detox for benzodiazepines. She has been prescribed Xanax for many years and has had seizures in the past when she ran out of her meds. She admitted earlier today that she took 4 pills last night when she picked up her script and also had drunk a pint of vodka. The patient is seeking to enter the treatment program for Hep C in December, but apparently the team in that department is unaware of her drug use. She admitted smoking cannabis on Sunday, the 20th and drank a pint of vodka and took 4 Xanax (0.25) last night. Until she is stable and abstinence has clearly been established, she would seem a poor candidate for treatment for her liver disease. Will seek to contact the infectious disease program at [facility].        Leiland Mihelich, LCAS

## 2014-05-28 NOTE — Progress Notes (Unsigned)
Susan Francis is a 58 y.o. female patient  CD-IOP: I phoned the patient to inquire about her well-being. She had reported not feeling good when she left group yesterday. She had assured me she would call me, but I received no message. Today I asked how she fared? The patient reported she had been so tired and stressed out that when she picked up her Xanax script on the way home after group, she had taken 4 pills. I reminded her that this was not acceptable and that she was only prescribed 3 pills per day. The patient explained that she had not slept at all the previous night and just wanted to sleep. She then admitted that on the way home, she had stopped by the [facility] store and purchased a pint of vodka. I reminded the patient that this was completely unacceptable. I explained that the program director of the CD-IOP, Darlyne Russian, had instructed me to tell this patient to seek a medical detox off the Xanax at the [facility]. He did not think she was capable of tapering herself nor was it safe for her to try this, especially considering her oft repeated statement that she has a 'seizure condition'. The patient was surprisingly accepting of this recommendation and admitted she doesn't want to drink or drug any more. The patient was instructed to report to the [facility] ED and explain she has been referred by the medical director of the CD-IOP to enter a medical detox for Xanax, which she has been taking for 25+ years. She is to mention her seizure history and they will find a bed for her. The patient was very pleasant and wrote down my instructions. She stated she would speak with her daughter about care for her husband and report to [facility] within the next 2 days. I agreed she would be able to return to the program at the conclusion of her taper.       Hilberto Burzynski, LCAS
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
## 2014-05-28 NOTE — ED Provider Notes (Signed)
Patient requesting help with Xanax and alcohol addiction. She is presently sleeping easily arousable, pleasant and cooperative  Results for orders placed during the hospital encounter of 05/27/14  ACETAMINOPHEN LEVEL      Result Value Ref Range   Acetaminophen (Tylenol), Serum <15.0  10 - 30 ug/mL  CBC      Result Value Ref Range   WBC 4.6  4.0 - 10.5 K/uL   RBC 4.37  3.87 - 5.11 MIL/uL   Hemoglobin 14.2  12.0 - 15.0 g/dL   HCT 41.2  36.0 - 46.0 %   MCV 94.3  78.0 - 100.0 fL   MCH 32.5  26.0 - 34.0 pg   MCHC 34.5  30.0 - 36.0 g/dL   RDW 15.2  11.5 - 15.5 %   Platelets 54 (*) 150 - 400 K/uL  COMPREHENSIVE METABOLIC PANEL      Result Value Ref Range   Sodium 137  137 - 147 mEq/L   Potassium 3.6 (*) 3.7 - 5.3 mEq/L   Chloride 98  96 - 112 mEq/L   CO2 28  19 - 32 mEq/L   Glucose, Bld 76  70 - 99 mg/dL   BUN 8  6 - 23 mg/dL   Creatinine, Ser 0.84  0.50 - 1.10 mg/dL   Calcium 9.2  8.4 - 10.5 mg/dL   Total Protein 7.0  6.0 - 8.3 g/dL   Albumin 3.3 (*) 3.5 - 5.2 g/dL   AST 136 (*) 0 - 37 U/L   ALT 76 (*) 0 - 35 U/L   Alkaline Phosphatase 126 (*) 39 - 117 U/L   Total Bilirubin 1.5 (*) 0.3 - 1.2 mg/dL   GFR calc non Af Amer 74 (*) >90 mL/min   GFR calc Af Amer 86 (*) >90 mL/min   Anion gap 11  5 - 15  ETHANOL      Result Value Ref Range   Alcohol, Ethyl (B) <11  0 - 11 mg/dL  SALICYLATE LEVEL      Result Value Ref Range   Salicylate Lvl <1.9 (*) 2.8 - 20.0 mg/dL  URINE RAPID DRUG SCREEN (HOSP PERFORMED)      Result Value Ref Range   Opiates NONE DETECTED  NONE DETECTED   Cocaine NONE DETECTED  NONE DETECTED   Benzodiazepines POSITIVE (*) NONE DETECTED   Amphetamines NONE DETECTED  NONE DETECTED   Tetrahydrocannabinol POSITIVE (*) NONE DETECTED   Barbiturates NONE DETECTED  NONE DETECTED   US Abdomen Complete W/elastography  05/24/2014   CLINICAL DATA:  Chronic hepatitis-C.  EXAM: ULTRASOUND ABDOMEN  ULTRASOUND HEPATIC ELASTOGRAPHY  TECHNIQUE: Sonography of the upper abdomen  was performed. In addition, ultrasound elastography evaluation of the liver was performed using Siemens Helix Virtual Touch Quantification. A region of interest was placed within the right lobe of the liver. Following application of a compressive sonographic pulse, shear waves were detected in the adjacent hepatic tissue and the shear wave velocity was calculated. Multiple assessments were performed at the selected site. Median shear wave velocity is correlated to a Metavir fibrosis score.  COMPARISON:  05/21/2013 ultrasound.  CT 06/10/2013  FINDINGS: ULTRASOUND ABDOMEN  Gallbladder:  1.6 cm gallstone within the gallbladder. Gallbladder wall is thickened, measuring 3.6 mm. Negative sonographic Murphy's.  Common bile duct:  Diameter: Normal caliber, 5 mm.  Liver:  Mildly nodular contours within the liver suggesting cirrhosis. No focal abnormality.  IVC:  No abnormality visualized.  Pancreas:  Visualized portion unremarkable.  Spleen:  Size and appearance  within normal limits.  Right Kidney:  Length: 12.0 cm. 11 mm cyst in the upper pole. No hydronephrosis. Normal echotexture.  Left Kidney:  Length: 10.5 cm. Echogenicity within normal limits. No mass or hydronephrosis visualized.  Abdominal aorta:  No aneurysm visualized.  Other findings:  None.  ULTRASOUND HEPATIC ELASTOGRAPHY  Hepatic Segment:  8  Number of measurements:  8  Median velocity:   2.45  m/sec  IQR: 0.33  Corresponding Metavir fibrosis score:  F4  Risk of fibrosis: High  Limitations of exam: None  Pertinent findings noted on other imaging exams:  None  IMPRESSION: Nodular contours of the level her compatible with cirrhosis.  Cholelithiasis. Gallbladder wall thickening. This is nonspecific, but most likely related to liver disease.  Median hepatic shear wave velocity is calculated at 2.45 m/sec.  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  Risk of fibrosis is high.  Please note that abnormal shear wave velocities may also be identified in clinical  settings other than with hepatic fibrosis, such as: acute hepatitis, elevated right heart and central venous pressures, veno-occlusive disease (Budd-Chiari), infiltrative processes such as mastocytosis/amyloidosis/infiltrative tumor, extrahepatic cholestasis, in the post-prandial state, and liver transplantation. Correlation with patient history, laboratory data, and clinical condition recommended.   Electronically Signed   By: Rolm Baptise M.D.   On: 05/24/2014 15:48     Orlie Dakin, MD 05/28/14 [id number]

## 2014-05-28 NOTE — ED Notes (Addendum)
Pt left at 2pm.  Pended d/c  Did not need paperwork

## 2014-05-28 NOTE — BH Assessment (Signed)
Assessment completed. Consulted Darlyne Russian, PA-C who recommended inpatient detox. TTS will contact other facilities for placement. Margarita Mail, PA-C has been notified of recommendation.

## 2014-05-30 ENCOUNTER — Other Ambulatory Visit (HOSPITAL_COMMUNITY): Payer: No Typology Code available for payment source

## 2014-05-30 ENCOUNTER — Telehealth: Payer: Self-pay | Admitting: *Deleted

## 2014-05-30 NOTE — Telephone Encounter (Signed)
i am little confused. i just saw her recently and she liked the counselors? i am mistaking what place she is going to? Can also try family health services?

## 2014-05-30 NOTE — Telephone Encounter (Signed)
Patient called, asking why Dr. Baxter Flattery requests that she go to behavioral health for counseling as a precursor to Hep C treatment.  Patient states she does not like the counseling there, feels like she does not mesh with the counselor, that she does not like their approach and she feels like she cannot speak there.  RN advised patient that it is important that she remain sober, especially for the duration of Hepatitis C treatment.  The [facility] counseling was endorsed by Dr. Baxter Flattery, but not set up by her.  RN advised patient that she could look into other counseling options with her insurance company, but that it is important to utilize resources to stay sober.  Patient agreed, stated she would give [facility] counseling another try. Landis Gandy, RN

## 2014-06-01 ENCOUNTER — Other Ambulatory Visit (HOSPITAL_COMMUNITY): Payer: No Typology Code available for payment source | Admitting: Psychology

## 2014-06-01 ENCOUNTER — Encounter (HOSPITAL_COMMUNITY): Payer: Self-pay | Admitting: Psychology

## 2014-06-01 DIAGNOSIS — F1020 Alcohol dependence, uncomplicated: Secondary | ICD-10-CM | POA: Diagnosis not present

## 2014-06-01 DIAGNOSIS — F10230 Alcohol dependence with withdrawal, uncomplicated: Secondary | ICD-10-CM

## 2014-06-01 DIAGNOSIS — B182 Chronic viral hepatitis C: Secondary | ICD-10-CM

## 2014-06-01 DIAGNOSIS — F1320 Sedative, hypnotic or anxiolytic dependence, uncomplicated: Secondary | ICD-10-CM

## 2014-06-02 ENCOUNTER — Telehealth (HOSPITAL_COMMUNITY): Payer: Self-pay | Admitting: Psychology

## 2014-06-02 NOTE — Progress Notes (Unsigned)
Susan Francis is a 58 y.o. female patient ***. CD-IOP: Individual Session. I met with the patient briefly at the conclusion of her group session this afternoon. She had returned for group today for the first time after being instructed to appear at the ED and enter a medical detox for her Xanax dependence. She had been instructed to return home and follow the Xanax taper she has been given. Today, I explained that she would need to complete the taper as identified by her PCP. In addition, she would be required to complete the detox before she returns to this program. I provided a copy of the PCP's taper schedule and we reviewed the daily specifics of the taper. Based on where she was now, we determined her last day would be next Friday, October 9th, and she would be able to return at that time. The patient confirmed she understood what she was to do and agreed she would attend AA meetings while she was completing the taper. We will continue to follow the patient closely as she completed the Xanax taper and will allow her to return to the CD-IOP when she has completed it. The patient's PCP is Dorian Heckle, MD at [facility]. She will be contacted with this latest information about this mutual patient.          Tierney Behl, LCAS

## 2014-06-03 ENCOUNTER — Other Ambulatory Visit (HOSPITAL_COMMUNITY): Payer: No Typology Code available for payment source | Attending: Psychiatry

## 2014-06-03 DIAGNOSIS — F329 Major depressive disorder, single episode, unspecified: Secondary | ICD-10-CM | POA: Insufficient documentation

## 2014-06-03 DIAGNOSIS — F1020 Alcohol dependence, uncomplicated: Secondary | ICD-10-CM | POA: Insufficient documentation

## 2014-06-03 DIAGNOSIS — F1320 Sedative, hypnotic or anxiolytic dependence, uncomplicated: Secondary | ICD-10-CM | POA: Insufficient documentation

## 2014-06-03 DIAGNOSIS — K219 Gastro-esophageal reflux disease without esophagitis: Secondary | ICD-10-CM | POA: Insufficient documentation

## 2014-06-03 DIAGNOSIS — F419 Anxiety disorder, unspecified: Secondary | ICD-10-CM | POA: Insufficient documentation

## 2014-06-05 ENCOUNTER — Encounter (HOSPITAL_COMMUNITY): Payer: Self-pay | Admitting: Psychology

## 2014-06-05 NOTE — Progress Notes (Signed)
    Daily Group Progress Note  Program: CD-IOP   Group Time: 1-2:30 pm  Participation Level: Active  Behavioral Response: Sharing, Rationalizing and Evasive  Type of Therapy: Process Group  Topic: After checking in, group members shared what had gone on in their lives since our last group session.  Group members shared about meetings they had attended.  One group member shared that he had relapsed, and processed that with the group.  There was one new group member who had the chance to introduce himself to the group today.  After checking in, the session shifted to focus on boundaries, how to set them, and discussion about areas where boundaries are needed in patients' lives were considered. Drug tests results were returned today, and drug tests were collected from patients who were absent last session.  Group Time: 2:45- 4pm  Participation Level: Active  Behavioral Response: Sharing  Type of Therapy: Psycho-education Group  Topic: The second part of the group was spent discussing the basics of 12-Step programs, in order to reinforce their importance in the recovery process.  Types of meetings were reviewed, and group members were able to talk about different meetings that they enjoyed.  The process of finding a sponsor was reviewed, and their role was discussed, as were the meanings of the 12-Steps.   Summary: The patient stated that she had a "wild weekend" and asked the counselor not to ask her to talk about it in group.  The patient stated that, following the last group; she had taken 4 Xanax and drank a pint of bourbon.  She discussed how she had talked with a friend in recovery on the phone, but did not call someone in the moment.  The patient was able to set boundaries with the young adults hanging out at her house, telling them that they needed to leave and smoke in their own houses.  She also stated that she had not been to any meetings due to the "weather".  Other members  grumbled and one stated she was making excuses.The patient plans to go to meetings between now and our next session.  [name] changed and is now 9/24.  Family Program: Family present? No   Name of family member(s):   UDS collected: No Results:   AA/NA attended?: No  Sponsor?: No   Susan Francis, LCAS

## 2014-06-06 ENCOUNTER — Other Ambulatory Visit (HOSPITAL_COMMUNITY): Payer: No Typology Code available for payment source

## 2014-06-08 ENCOUNTER — Other Ambulatory Visit (HOSPITAL_COMMUNITY): Payer: No Typology Code available for payment source

## 2014-06-10 ENCOUNTER — Other Ambulatory Visit (HOSPITAL_COMMUNITY): Payer: No Typology Code available for payment source | Admitting: Psychology

## 2014-06-10 DIAGNOSIS — F10230 Alcohol dependence with withdrawal, uncomplicated: Secondary | ICD-10-CM

## 2014-06-10 DIAGNOSIS — K219 Gastro-esophageal reflux disease without esophagitis: Secondary | ICD-10-CM | POA: Diagnosis not present

## 2014-06-10 DIAGNOSIS — F1020 Alcohol dependence, uncomplicated: Secondary | ICD-10-CM | POA: Diagnosis not present

## 2014-06-10 DIAGNOSIS — F419 Anxiety disorder, unspecified: Secondary | ICD-10-CM | POA: Diagnosis not present

## 2014-06-10 DIAGNOSIS — F1320 Sedative, hypnotic or anxiolytic dependence, uncomplicated: Secondary | ICD-10-CM | POA: Diagnosis not present

## 2014-06-10 DIAGNOSIS — F329 Major depressive disorder, single episode, unspecified: Secondary | ICD-10-CM | POA: Diagnosis not present

## 2014-06-12 ENCOUNTER — Emergency Department (HOSPITAL_COMMUNITY)
Admission: EM | Admit: 2014-06-12 | Discharge: 2014-06-12 | Disposition: A | Discharge status: Home / Self Care | Payer: Medicare Other | Attending: Emergency Medicine | Admitting: Emergency Medicine

## 2014-06-12 ENCOUNTER — Encounter (HOSPITAL_COMMUNITY): Payer: Self-pay | Admitting: Emergency Medicine

## 2014-06-12 DIAGNOSIS — K219 Gastro-esophageal reflux disease without esophagitis: Secondary | ICD-10-CM | POA: Diagnosis not present

## 2014-06-12 DIAGNOSIS — Z720 Tobacco use: Secondary | ICD-10-CM | POA: Diagnosis not present

## 2014-06-12 DIAGNOSIS — Z8651 Personal history of combat and operational stress reaction: Secondary | ICD-10-CM | POA: Insufficient documentation

## 2014-06-12 DIAGNOSIS — R569 Unspecified convulsions: Secondary | ICD-10-CM | POA: Insufficient documentation

## 2014-06-12 DIAGNOSIS — F419 Anxiety disorder, unspecified: Secondary | ICD-10-CM | POA: Insufficient documentation

## 2014-06-12 DIAGNOSIS — Z79899 Other long term (current) drug therapy: Secondary | ICD-10-CM | POA: Diagnosis not present

## 2014-06-12 LAB — CBC WITH DIFFERENTIAL/PLATELET
Basophils Absolute: 0 10*3/uL (ref 0.0–0.1)
Basophils Relative: 1 % (ref 0–1)
EOS PCT: 4 % (ref 0–5)
Eosinophils Absolute: 0.2 10*3/uL (ref 0.0–0.7)
HCT: 43.1 % (ref 36.0–46.0)
Hemoglobin: 15.2 g/dL — ABNORMAL HIGH (ref 12.0–15.0)
LYMPHS ABS: 1.6 10*3/uL (ref 0.7–4.0)
Lymphocytes Relative: 38 % (ref 12–46)
MCH: 32 pg (ref 26.0–34.0)
MCHC: 35.3 g/dL (ref 30.0–36.0)
MCV: 90.7 fL (ref 78.0–100.0)
MONO ABS: 0.3 10*3/uL (ref 0.1–1.0)
MONOS PCT: 7 % (ref 3–12)
Neutro Abs: 2.1 10*3/uL (ref 1.7–7.7)
Neutrophils Relative %: 51 % (ref 43–77)
Platelets: 55 10*3/uL — ABNORMAL LOW (ref 150–400)
RBC: 4.75 MIL/uL (ref 3.87–5.11)
RDW: 15.1 % (ref 11.5–15.5)
WBC: 4.1 10*3/uL (ref 4.0–10.5)

## 2014-06-12 LAB — URINALYSIS, ROUTINE W REFLEX MICROSCOPIC
Bilirubin Urine: NEGATIVE
GLUCOSE, UA: NEGATIVE mg/dL
HGB URINE DIPSTICK: NEGATIVE
Ketones, ur: NEGATIVE mg/dL
Leukocytes, UA: NEGATIVE
NITRITE: NEGATIVE
Protein, ur: 100 mg/dL — AB
SPECIFIC GRAVITY, URINE: 1.013 (ref 1.005–1.030)
Urobilinogen, UA: 1 mg/dL (ref 0.0–1.0)
pH: 6 (ref 5.0–8.0)

## 2014-06-12 LAB — COMPREHENSIVE METABOLIC PANEL
ALBUMIN: 3.4 g/dL — AB (ref 3.5–5.2)
ALK PHOS: 113 U/L (ref 39–117)
ALT: 68 U/L — ABNORMAL HIGH (ref 0–35)
AST: 117 U/L — AB (ref 0–37)
Anion gap: 16 — ABNORMAL HIGH (ref 5–15)
BILIRUBIN TOTAL: 2 mg/dL — AB (ref 0.3–1.2)
BUN: 5 mg/dL — AB (ref 6–23)
CO2: 22 mEq/L (ref 19–32)
CREATININE: 0.75 mg/dL (ref 0.50–1.10)
Calcium: 9.5 mg/dL (ref 8.4–10.5)
Chloride: 97 mEq/L (ref 96–112)
GFR calc Af Amer: >90 mL/min (ref >90)
GFR calc non Af Amer: >90 mL/min (ref >90)
GLUCOSE: 89 mg/dL (ref 70–99)
Potassium: 3.6 mEq/L — ABNORMAL LOW (ref 3.7–5.3)
Sodium: 135 mEq/L — ABNORMAL LOW (ref 137–147)
Total Protein: 7.3 g/dL (ref 6.0–8.3)

## 2014-06-12 LAB — URINE MICROSCOPIC-ADD ON

## 2014-06-12 LAB — ETHANOL: Alcohol, Ethyl (B): <11 mg/dL (ref 0–11)

## 2014-06-12 MED ORDER — LEVETIRACETAM 500 MG PO TABS: 500.0000 mg | ORAL_TABLET | Freq: Two times a day (BID) | ORAL | Status: DC

## 2014-06-12 MED ORDER — LEVETIRACETAM IN NACL 1000 MG/100ML IV SOLN
1000.0000 mg | Freq: Once | INTRAVENOUS | Status: AC
  Administered 2014-06-12: 1000 mg via INTRAVENOUS
  Filled 2014-06-12: qty 100
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
## 2014-06-12 NOTE — ED Notes (Signed)
Discharge instructions reviewed with pt. Pt verbalized understanding.   

## 2014-06-12 NOTE — ED Provider Notes (Signed)
Patient had generalized seizure this afternoon. No injury except for that her tongue as result. Her last alcoholic drink was 98/72/1587. Patient has history of prior seizure disorder. Her last seizure approximately 5 years ago. She reports that she had been on Keppra several years ago but had been discontinued. Presently patient alert Glasgow Coma Score 15 moves all extremities well cranial nerves II through XII grossly intact Will restart Keppra. Refer to [facility] who has followed her in the past  Orlie Dakin, MD 06/12/14 1814

## 2014-06-12 NOTE — ED Notes (Signed)
Pt was at [place] meeting at a church when she had a witnessed grand mal seizure on soft surface. Pt was post ictal upon EMS arrival. No meds given. Pt may be alcohol withdrawal and is at behavior health. Per witnesses seizure lasted 2 mins. Pt bit her tongue during seizure.

## 2014-06-12 NOTE — Discharge Instructions (Signed)
Take the prescribed medication as directed. Follow-up with [facility]-- call and schedule appt  Return to the ED for new or worsening symptoms.

## 2014-06-13 ENCOUNTER — Other Ambulatory Visit (HOSPITAL_COMMUNITY): Payer: No Typology Code available for payment source

## 2014-06-13 ENCOUNTER — Encounter (HOSPITAL_COMMUNITY): Payer: Self-pay | Admitting: Psychology

## 2014-06-13 ENCOUNTER — Telehealth (HOSPITAL_COMMUNITY): Payer: Self-pay | Admitting: Psychology

## 2014-06-13 NOTE — ED Provider Notes (Signed)
Medical screening examination/treatment/procedure(s) were conducted as a shared visit with non-physician practitioner(s) and myself.  I personally evaluated the patient during the encounter.   EKG Interpretation   Date/Time:  Sunday June 12 2014 18:09:49 EDT Ventricular Rate:  88 PR Interval:  130 QRS Duration: 92 QT Interval:  395 QTC Calculation: 478 R Axis:   32 Text Interpretation:  Sinus rhythm No significant change since last  tracing Confirmed by Winfred Leeds  MD, Mark Benecke [phone]) on 06/12/2014 6:13:20 PM       Orlie Dakin, MD 06/13/14 0150

## 2014-06-13 NOTE — Progress Notes (Signed)
    Daily Group Progress Note  Program: CD-IOP   Group Time: 1-2 pm  Participation Level: Active  Behavioral Response: Sharing, Rationalizing and Minimizing  Type of Therapy: Process Group  Topic: Process: the first part of group was spent in process. Members shared about the past 2 days and events, experiences or challenges in early recovery. One member had returned today after being gone for the last 4 sessions in order to complete a Xanax taper. A new group member was also present today. She had been in the group about 5 weeks ago, but had relapsed and gone to [employer]. She introduced herself and explained what she needed to work on while in the program. There was good feedback and disclosures among members. Drug tests were collected from the new group members and those current members who had not been present in Wednesday's session. During the group today, the medical director pulled out new members along with current members to discuss medications or complete their assessment.   Group Time: 2:15-4 pm  Participation Level: Active  Behavioral Response: Sharing  Type of Therapy: Psycho-education Group  Topic:Guest Speaker: Pharmacist. The pharmacist, EP, from upstairs in [facility] visited the group today. She discussed the effects of drugs and early withdrawal and what those chemicals cause in the brain. She also shared about mediations frequently prescribed to address various types of mental illness. The guest speaker also identified what medications should be encouraged and what should be avoided when the patient is chemically dependent. There were many questions and the pharmacist answered all of them. The session went well with the members discussing how much they had learned from her visit.    Summary: The patient appeared today for the first time since early last week. She had been tapering off Xanax and was not allowed to return to group until she had completed the taper. Today,  the patient reported she had been doing well, but hadn't attended any AA meetings because of the weather. Other members groaned with this disclosure. When asked about her sobriety date, the patient had to consider, but she eventually identified 10/6 as her date. 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 When asked by another member, the patient described herself as caring for her husband 24/7. When I asked if she would pick up another starter chip, the patient reported she finds it very "intimidating" when everyone claps and the noise is so loud. There was good feedback with this disclosure and members differed about their own experiences. The patient shared little else of herself and it is unclear what she hopes to accomplish through the group experience. [name] is 10/6 and a drug test was collected today.    Family Program: Family present? No   Name of family member(s):   UDS collected: Yes Results: positive for marijuana  AA/NA attended?: No  Sponsor?: No   Jazmaine Fuelling, LCAS

## 2014-06-15 ENCOUNTER — Encounter (HOSPITAL_COMMUNITY): Payer: Self-pay | Admitting: Psychology

## 2014-06-15 ENCOUNTER — Other Ambulatory Visit (HOSPITAL_COMMUNITY): Payer: No Typology Code available for payment source

## 2014-06-16 NOTE — Progress Notes (Unsigned)
Susan Francis is a 58 y.o. female patient ***. CD-IOP: Discharge from the program. The patient has relapsed 3 times since entering the program. She has been encouraged to seek residential treatment to address her chemical dependency. She denied any intention to go anywhere, but it seems clear she cannot stop drinking or smoking cannabis. Please see discharge plan in Epic, dated 06/15/14.        Sahej Hauswirth, LCAS

## 2014-06-17 ENCOUNTER — Other Ambulatory Visit (HOSPITAL_COMMUNITY): Payer: No Typology Code available for payment source

## 2014-06-20 ENCOUNTER — Other Ambulatory Visit (HOSPITAL_COMMUNITY): Payer: No Typology Code available for payment source

## 2014-06-22 ENCOUNTER — Other Ambulatory Visit (HOSPITAL_COMMUNITY): Payer: No Typology Code available for payment source

## 2014-06-24 ENCOUNTER — Other Ambulatory Visit (HOSPITAL_COMMUNITY): Payer: No Typology Code available for payment source

## 2014-06-24 NOTE — ED Provider Notes (Signed)
Medical screening examination/treatment/procedure(s) were conducted as a shared visit with non-physician practitioner(s) and myself.  I personally evaluated the patient during the encounter.   EKG Interpretation None       Orlie Dakin, MD 06/24/14 1215

## 2014-06-27 ENCOUNTER — Other Ambulatory Visit (HOSPITAL_COMMUNITY): Payer: No Typology Code available for payment source

## 2014-06-29 ENCOUNTER — Other Ambulatory Visit (HOSPITAL_COMMUNITY): Payer: No Typology Code available for payment source

## 2014-07-01 ENCOUNTER — Other Ambulatory Visit (HOSPITAL_COMMUNITY): Payer: No Typology Code available for payment source

## 2014-07-04 ENCOUNTER — Other Ambulatory Visit (HOSPITAL_COMMUNITY): Payer: No Typology Code available for payment source

## 2014-07-06 ENCOUNTER — Other Ambulatory Visit (HOSPITAL_COMMUNITY): Payer: No Typology Code available for payment source

## 2014-07-08 ENCOUNTER — Other Ambulatory Visit (HOSPITAL_COMMUNITY): Payer: No Typology Code available for payment source

## 2014-07-10 ENCOUNTER — Encounter (HOSPITAL_COMMUNITY): Payer: Self-pay

## 2014-07-10 DIAGNOSIS — R760 Raised antibody titer: Secondary | ICD-10-CM | POA: Insufficient documentation

## 2014-07-10 DIAGNOSIS — F1020 Alcohol dependence, uncomplicated: Secondary | ICD-10-CM | POA: Insufficient documentation

## 2014-07-10 DIAGNOSIS — F411 Generalized anxiety disorder: Secondary | ICD-10-CM | POA: Insufficient documentation

## 2014-07-10 DIAGNOSIS — D696 Thrombocytopenia, unspecified: Secondary | ICD-10-CM | POA: Insufficient documentation

## 2014-07-10 NOTE — Progress Notes (Unsigned)
  [facility] Discharge Summary   Susan Francis [id number]  Date of Admission: 05/22/2014 Date of Discharge: 06/24/2014  Course of Treatment: Pt was initially accepted to treatment but was needing to taper off xanax and after 2 episodes of seizure activity she was excused until her taper was completed . Upon completion of taper she returned to CD IOP but was unable to to fufill requirements of meetings and abstinence inpart due to living with practicing addicted husband and was recommended to receive inpt therapy.  Goals and Activities to Help Maintain Sobriety: 1. Stay away from old friends who continue to drink and use mind-altering chemicals. 2. Continue practicing Fair Fighting rules in interpersonal conflicts. 3. Continue alcohol and drug refusal skills and call on support systems. 4. Go to inpt treatment  Referrals: IP treatment   Aftercare services: NA 1. Attend AA/NA meetings NA  times per week. 2. Obtain a sponsor and a home group in [place]. 3. Return to Psychotherapist: PRN  Next appointment: NA  Plan of Action to Address Continuing Problems: IP treatment facility of choice    Client has NOT participated in the development of this discharge plan and has received a copy of this completed plan  Dara Hoyer  07/10/2014   [facility] CHEM 07/10/2014

## 2014-07-11 ENCOUNTER — Other Ambulatory Visit (HOSPITAL_COMMUNITY): Payer: No Typology Code available for payment source

## 2014-07-12 ENCOUNTER — Inpatient Hospital Stay (HOSPITAL_COMMUNITY)
Admission: EM | Admit: 2014-07-12 | Discharge: 2014-07-17 | DRG: 100 | Disposition: A | Discharge status: Home Health | Payer: Medicare Other | Attending: Internal Medicine | Admitting: Internal Medicine

## 2014-07-12 ENCOUNTER — Emergency Department (HOSPITAL_COMMUNITY): Payer: Medicare Other

## 2014-07-12 ENCOUNTER — Encounter (HOSPITAL_COMMUNITY): Payer: Self-pay | Admitting: Emergency Medicine

## 2014-07-12 ENCOUNTER — Inpatient Hospital Stay (HOSPITAL_COMMUNITY): Payer: Medicare Other

## 2014-07-12 DIAGNOSIS — K219 Gastro-esophageal reflux disease without esophagitis: Secondary | ICD-10-CM | POA: Diagnosis present

## 2014-07-12 DIAGNOSIS — R451 Restlessness and agitation: Secondary | ICD-10-CM | POA: Diagnosis not present

## 2014-07-12 DIAGNOSIS — J449 Chronic obstructive pulmonary disease, unspecified: Secondary | ICD-10-CM | POA: Diagnosis present

## 2014-07-12 DIAGNOSIS — R739 Hyperglycemia, unspecified: Secondary | ICD-10-CM | POA: Diagnosis present

## 2014-07-12 DIAGNOSIS — Z01818 Encounter for other preprocedural examination: Secondary | ICD-10-CM

## 2014-07-12 DIAGNOSIS — J9690 Respiratory failure, unspecified, unspecified whether with hypoxia or hypercapnia: Secondary | ICD-10-CM | POA: Diagnosis present

## 2014-07-12 DIAGNOSIS — Z9911 Dependence on respirator [ventilator] status: Secondary | ICD-10-CM

## 2014-07-12 DIAGNOSIS — F419 Anxiety disorder, unspecified: Secondary | ICD-10-CM | POA: Diagnosis present

## 2014-07-12 DIAGNOSIS — E871 Hypo-osmolality and hyponatremia: Secondary | ICD-10-CM | POA: Diagnosis present

## 2014-07-12 DIAGNOSIS — G40901 Epilepsy, unspecified, not intractable, with status epilepticus: Secondary | ICD-10-CM | POA: Diagnosis present

## 2014-07-12 DIAGNOSIS — I10 Essential (primary) hypertension: Secondary | ICD-10-CM | POA: Diagnosis present

## 2014-07-12 DIAGNOSIS — F17210 Nicotine dependence, cigarettes, uncomplicated: Secondary | ICD-10-CM | POA: Diagnosis present

## 2014-07-12 DIAGNOSIS — B182 Chronic viral hepatitis C: Secondary | ICD-10-CM | POA: Diagnosis present

## 2014-07-12 DIAGNOSIS — G9349 Other encephalopathy: Secondary | ICD-10-CM | POA: Diagnosis present

## 2014-07-12 DIAGNOSIS — G9340 Encephalopathy, unspecified: Secondary | ICD-10-CM | POA: Insufficient documentation

## 2014-07-12 DIAGNOSIS — E039 Hypothyroidism, unspecified: Secondary | ICD-10-CM | POA: Diagnosis present

## 2014-07-12 DIAGNOSIS — K7460 Unspecified cirrhosis of liver: Secondary | ICD-10-CM | POA: Diagnosis present

## 2014-07-12 DIAGNOSIS — E876 Hypokalemia: Secondary | ICD-10-CM | POA: Diagnosis present

## 2014-07-12 DIAGNOSIS — Z981 Arthrodesis status: Secondary | ICD-10-CM | POA: Diagnosis not present

## 2014-07-12 DIAGNOSIS — R569 Unspecified convulsions: Secondary | ICD-10-CM

## 2014-07-12 DIAGNOSIS — D696 Thrombocytopenia, unspecified: Secondary | ICD-10-CM | POA: Diagnosis present

## 2014-07-12 DIAGNOSIS — F1290 Cannabis use, unspecified, uncomplicated: Secondary | ICD-10-CM | POA: Diagnosis present

## 2014-07-12 DIAGNOSIS — R509 Fever, unspecified: Secondary | ICD-10-CM

## 2014-07-12 DIAGNOSIS — J439 Emphysema, unspecified: Secondary | ICD-10-CM

## 2014-07-12 DIAGNOSIS — B1920 Unspecified viral hepatitis C without hepatic coma: Secondary | ICD-10-CM | POA: Diagnosis present

## 2014-07-12 DIAGNOSIS — F1010 Alcohol abuse, uncomplicated: Secondary | ICD-10-CM | POA: Diagnosis present

## 2014-07-12 DIAGNOSIS — J9601 Acute respiratory failure with hypoxia: Secondary | ICD-10-CM | POA: Insufficient documentation

## 2014-07-12 HISTORY — DX: Epilepsy, unspecified, not intractable, with status epilepticus: G40.901

## 2014-07-12 LAB — CBC WITH DIFFERENTIAL/PLATELET
BASOS ABS: 0 10*3/uL (ref 0.0–0.1)
BASOS PCT: 0 % (ref 0–1)
Eosinophils Absolute: 0.1 10*3/uL (ref 0.0–0.7)
Eosinophils Relative: 1 % (ref 0–5)
HCT: 47 % — ABNORMAL HIGH (ref 36.0–46.0)
Hemoglobin: 15.8 g/dL — ABNORMAL HIGH (ref 12.0–15.0)
Lymphocytes Relative: 29 % (ref 12–46)
Lymphs Abs: 2.7 10*3/uL (ref 0.7–4.0)
MCH: 32.6 pg (ref 26.0–34.0)
MCHC: 33.6 g/dL (ref 30.0–36.0)
MCV: 97.1 fL (ref 78.0–100.0)
MONOS PCT: 5 % (ref 3–12)
Monocytes Absolute: 0.5 10*3/uL (ref 0.1–1.0)
NEUTROS ABS: 6.2 10*3/uL (ref 1.7–7.7)
NEUTROS PCT: 65 % (ref 43–77)
Platelets: 77 10*3/uL — ABNORMAL LOW (ref 150–400)
RBC: 4.84 MIL/uL (ref 3.87–5.11)
RDW: 16 % — ABNORMAL HIGH (ref 11.5–15.5)
WBC: 9.4 10*3/uL (ref 4.0–10.5)

## 2014-07-12 LAB — URINALYSIS, ROUTINE W REFLEX MICROSCOPIC
BILIRUBIN URINE: NEGATIVE
Bilirubin Urine: NEGATIVE
GLUCOSE, UA: NEGATIVE mg/dL
Glucose, UA: NEGATIVE mg/dL
Ketones, ur: NEGATIVE mg/dL
Ketones, ur: NEGATIVE mg/dL
LEUKOCYTES UA: NEGATIVE
Leukocytes, UA: NEGATIVE
NITRITE: NEGATIVE
Nitrite: NEGATIVE
PH: 5 (ref 5.0–8.0)
Protein, ur: 30 mg/dL — AB
Protein, ur: NEGATIVE mg/dL
SPECIFIC GRAVITY, URINE: 1.015 (ref 1.005–1.030)
Specific Gravity, Urine: 1.014 (ref 1.005–1.030)
UROBILINOGEN UA: 0.2 mg/dL (ref 0.0–1.0)
Urobilinogen, UA: 0.2 mg/dL (ref 0.0–1.0)
pH: 5.5 (ref 5.0–8.0)

## 2014-07-12 LAB — COMPREHENSIVE METABOLIC PANEL
ALK PHOS: 133 U/L — AB (ref 39–117)
ALT: 96 U/L — ABNORMAL HIGH (ref 0–35)
AST: 183 U/L — AB (ref 0–37)
Albumin: 3.5 g/dL (ref 3.5–5.2)
Anion gap: 29 — ABNORMAL HIGH (ref 5–15)
BILIRUBIN TOTAL: 1.5 mg/dL — AB (ref 0.3–1.2)
BUN: 6 mg/dL (ref 6–23)
CO2: 12 meq/L — AB (ref 19–32)
Calcium: 8.7 mg/dL (ref 8.4–10.5)
Chloride: 102 mEq/L (ref 96–112)
Creatinine, Ser: 0.93 mg/dL (ref 0.50–1.10)
GFR calc Af Amer: 76 mL/min — ABNORMAL LOW (ref >90)
GFR calc non Af Amer: 65 mL/min — ABNORMAL LOW (ref >90)
Glucose, Bld: 156 mg/dL — ABNORMAL HIGH (ref 70–99)
POTASSIUM: 4.3 meq/L (ref 3.7–5.3)
Sodium: 143 mEq/L (ref 137–147)
Total Protein: 7 g/dL (ref 6.0–8.3)

## 2014-07-12 LAB — URINE MICROSCOPIC-ADD ON

## 2014-07-12 LAB — CREATININE, SERUM
CREATININE: 0.93 mg/dL (ref 0.50–1.10)
GFR calc Af Amer: 76 mL/min — ABNORMAL LOW (ref >90)
GFR, EST NON AFRICAN AMERICAN: 65 mL/min — AB (ref >90)

## 2014-07-12 LAB — CBC
HCT: 43.8 % (ref 36.0–46.0)
HEMOGLOBIN: 15 g/dL (ref 12.0–15.0)
MCH: 32.3 pg (ref 26.0–34.0)
MCHC: 34.2 g/dL (ref 30.0–36.0)
MCV: 94.2 fL (ref 78.0–100.0)
Platelets: 55 10*3/uL — ABNORMAL LOW (ref 150–400)
RBC: 4.65 MIL/uL (ref 3.87–5.11)
RDW: 15.9 % — ABNORMAL HIGH (ref 11.5–15.5)
WBC: 7.2 10*3/uL (ref 4.0–10.5)

## 2014-07-12 LAB — SALICYLATE LEVEL: Salicylate Lvl: <2 mg/dL — ABNORMAL LOW (ref 2.8–20.0)

## 2014-07-12 LAB — ETHANOL: Alcohol, Ethyl (B): <11 mg/dL (ref 0–11)

## 2014-07-12 LAB — TRIGLYCERIDES: Triglycerides: 178 mg/dL — ABNORMAL HIGH (ref <150)

## 2014-07-12 LAB — I-STAT TROPONIN, ED: Troponin i, poc: 0.04 ng/mL (ref 0.00–0.08)

## 2014-07-12 LAB — RAPID URINE DRUG SCREEN, HOSP PERFORMED
AMPHETAMINES: NOT DETECTED
BENZODIAZEPINES: POSITIVE — AB
Barbiturates: NOT DETECTED
COCAINE: NOT DETECTED
OPIATES: NOT DETECTED
Tetrahydrocannabinol: POSITIVE — AB

## 2014-07-12 LAB — AMMONIA: Ammonia: 57 umol/L (ref 11–60)

## 2014-07-12 LAB — MRSA PCR SCREENING: MRSA BY PCR: NEGATIVE

## 2014-07-12 LAB — ACETAMINOPHEN LEVEL

## 2014-07-12 LAB — TROPONIN I: TROPONIN I: 0.42 ng/mL — AB (ref <0.30)

## 2014-07-12 LAB — PROCALCITONIN: Procalcitonin: <0.1 ng/mL

## 2014-07-12 LAB — GLUCOSE, CAPILLARY: Glucose-Capillary: 148 mg/dL — ABNORMAL HIGH (ref 70–99)

## 2014-07-12 MED ORDER — FENTANYL CITRATE 0.05 MG/ML IJ SOLN
25.0000 ug | INTRAMUSCULAR | Status: DC | PRN
  Administered 2014-07-12: 50 ug via INTRAVENOUS
  Administered 2014-07-12 – 2014-07-13 (×4): 100 ug via INTRAVENOUS
  Filled 2014-07-12 (×4): qty 2

## 2014-07-12 MED ORDER — ETOMIDATE 2 MG/ML IV SOLN
0.3000 mg/kg | Freq: Once | INTRAVENOUS | Status: AC
  Administered 2014-07-12: 19.74 mg via INTRAVENOUS

## 2014-07-12 MED ORDER — HEPARIN SODIUM (PORCINE) 5000 UNIT/ML IJ SOLN
5000.0000 [IU] | Freq: Three times a day (TID) | INTRAMUSCULAR | Status: DC
  Administered 2014-07-12: 5000 [IU] via SUBCUTANEOUS

## 2014-07-12 MED ORDER — CETYLPYRIDINIUM CHLORIDE 0.05 % MT LIQD
7.0000 mL | Freq: Four times a day (QID) | OROMUCOSAL | Status: DC
  Administered 2014-07-12 – 2014-07-13 (×4): 7 mL via OROMUCOSAL

## 2014-07-12 MED ORDER — SODIUM CHLORIDE 0.9 % IV BOLUS (SEPSIS)
1000.0000 mL | Freq: Once | INTRAVENOUS | Status: AC
Start: 2014-07-12 — End: 2014-07-12
  Administered 2014-07-12: 1000 mL via INTRAVENOUS

## 2014-07-12 MED ORDER — DEXTROSE 5 % IV SOLN
2.0000 g | INTRAVENOUS | Status: DC
  Administered 2014-07-12: 2 g via INTRAVENOUS
  Filled 2014-07-12 (×2): qty 2

## 2014-07-12 MED ORDER — ALBUTEROL SULFATE (2.5 MG/3ML) 0.083% IN NEBU: 2.5000 mg | INHALATION_SOLUTION | RESPIRATORY_TRACT | Status: DC | PRN

## 2014-07-12 MED ORDER — SODIUM CHLORIDE 0.9 % IV SOLN
400.0000 mg | Freq: Once | INTRAVENOUS | Status: AC
  Administered 2014-07-12: 400 mg via INTRAVENOUS
  Filled 2014-07-12: qty 40

## 2014-07-12 MED ORDER — SUCCINYLCHOLINE CHLORIDE 20 MG/ML IJ SOLN
100.0000 mg | Freq: Once | INTRAMUSCULAR | Status: AC
  Administered 2014-07-12: 100 mg via INTRAVENOUS

## 2014-07-12 MED ORDER — LORAZEPAM 2 MG/ML IJ SOLN
INTRAMUSCULAR | Status: AC
  Administered 2014-07-12: 2 mg
  Filled 2014-07-12: qty 1

## 2014-07-12 MED ORDER — IPRATROPIUM-ALBUTEROL 0.5-2.5 (3) MG/3ML IN SOLN
3.0000 mL | Freq: Four times a day (QID) | RESPIRATORY_TRACT | Status: DC
  Administered 2014-07-12 – 2014-07-15 (×9): 3 mL via RESPIRATORY_TRACT
  Filled 2014-07-12 (×10): qty 3

## 2014-07-12 MED ORDER — SODIUM CHLORIDE 0.9 % IV SOLN
100.0000 mg | Freq: Two times a day (BID) | INTRAVENOUS | Status: DC
  Filled 2014-07-12 (×2): qty 10

## 2014-07-12 MED ORDER — SODIUM CHLORIDE 0.9 % IV SOLN: 2000.0000 mg | Freq: Once | INTRAVENOUS | Status: DC

## 2014-07-12 MED ORDER — SODIUM CHLORIDE 0.9 % IV SOLN: 250.0000 mL | INTRAVENOUS | Status: DC | PRN

## 2014-07-12 MED ORDER — FENTANYL CITRATE 0.05 MG/ML IJ SOLN
INTRAMUSCULAR | Status: AC
  Filled 2014-07-12: qty 2

## 2014-07-12 MED ORDER — DEXTROSE-NACL 5-0.45 % IV SOLN
INTRAVENOUS | Status: DC
  Administered 2014-07-12 – 2014-07-13 (×2): via INTRAVENOUS
  Administered 2014-07-13: 1000 mL via INTRAVENOUS

## 2014-07-12 MED ORDER — LEVETIRACETAM IN NACL 1000 MG/100ML IV SOLN
1000.0000 mg | INTRAVENOUS | Status: AC
  Administered 2014-07-12: 1000 mg via INTRAVENOUS
  Filled 2014-07-12: qty 100

## 2014-07-12 MED ORDER — LORAZEPAM 2 MG/ML IJ SOLN: 2.0000 mg | Freq: Once | INTRAMUSCULAR | Status: DC

## 2014-07-12 MED ORDER — LEVETIRACETAM IN NACL 500 MG/100ML IV SOLN
500.0000 mg | Freq: Two times a day (BID) | INTRAVENOUS | Status: DC
  Administered 2014-07-12: 500 mg via INTRAVENOUS
  Filled 2014-07-12 (×2): qty 100

## 2014-07-12 MED ORDER — LORAZEPAM 2 MG/ML IJ SOLN
INTRAMUSCULAR | Status: AC
  Filled 2014-07-12: qty 1

## 2014-07-12 MED ORDER — PROPOFOL 10 MG/ML IV EMUL
5.0000 ug/kg/min | INTRAVENOUS | Status: DC
  Administered 2014-07-12: 5 ug/kg/min via INTRAVENOUS

## 2014-07-12 MED ORDER — FAMOTIDINE IN NACL 20-0.9 MG/50ML-% IV SOLN
20.0000 mg | Freq: Two times a day (BID) | INTRAVENOUS | Status: DC
  Administered 2014-07-12 – 2014-07-14 (×5): 20 mg via INTRAVENOUS
  Filled 2014-07-12 (×6): qty 50

## 2014-07-12 MED ORDER — CHLORHEXIDINE GLUCONATE 0.12 % MT SOLN: 15.0000 mL | Freq: Two times a day (BID) | OROMUCOSAL | Status: DC

## 2014-07-12 MED ORDER — BUDESONIDE 0.25 MG/2ML IN SUSP
0.2500 mg | Freq: Four times a day (QID) | RESPIRATORY_TRACT | Status: DC
  Administered 2014-07-12 – 2014-07-15 (×7): 0.25 mg via RESPIRATORY_TRACT
  Filled 2014-07-12 (×17): qty 2

## 2014-07-12 MED ORDER — SODIUM CHLORIDE 0.9 % IV SOLN
100.0000 mg | Freq: Two times a day (BID) | INTRAVENOUS | Status: DC
  Administered 2014-07-12 – 2014-07-15 (×6): 100 mg via INTRAVENOUS
  Filled 2014-07-12 (×13): qty 10

## 2014-07-12 MED ORDER — VANCOMYCIN HCL IN DEXTROSE 750-5 MG/150ML-% IV SOLN
750.0000 mg | Freq: Two times a day (BID) | INTRAVENOUS | Status: DC
  Administered 2014-07-13: 750 mg via INTRAVENOUS
  Filled 2014-07-12 (×2): qty 150

## 2014-07-12 MED ORDER — ACETAMINOPHEN 650 MG RE SUPP
650.0000 mg | Freq: Once | RECTAL | Status: AC
  Administered 2014-07-12: 650 mg via RECTAL
  Filled 2014-07-12: qty 1

## 2014-07-12 MED ORDER — LORAZEPAM 2 MG/ML IJ SOLN
2.0000 mg | Freq: Once | INTRAMUSCULAR | Status: AC
  Administered 2014-07-12: 2 mg via INTRAVENOUS

## 2014-07-12 MED ORDER — CHLORHEXIDINE GLUCONATE 0.12 % MT SOLN
15.0000 mL | Freq: Two times a day (BID) | OROMUCOSAL | Status: DC
  Administered 2014-07-13: 15 mL via OROMUCOSAL
  Filled 2014-07-12: qty 15

## 2014-07-12 MED ORDER — VANCOMYCIN HCL 10 G IV SOLR
1250.0000 mg | Freq: Once | INTRAVENOUS | Status: AC
  Administered 2014-07-12: 1250 mg via INTRAVENOUS
  Filled 2014-07-12: qty 1250

## 2014-07-12 MED ORDER — PROPOFOL 10 MG/ML IV EMUL
5.0000 ug/kg/min | INTRAVENOUS | Status: DC
  Administered 2014-07-12: 25 ug/kg/min via INTRAVENOUS
  Administered 2014-07-13: 35 ug/kg/min via INTRAVENOUS
  Filled 2014-07-12 (×2): qty 100

## 2014-07-12 MED ORDER — CETYLPYRIDINIUM CHLORIDE 0.05 % MT LIQD: 7.0000 mL | Freq: Four times a day (QID) | OROMUCOSAL | Status: DC

## 2014-07-12 MED ORDER — ACETAMINOPHEN 325 MG PO TABS: 650.0000 mg | ORAL_TABLET | ORAL | Status: DC | PRN

## 2014-07-12 NOTE — ED Notes (Signed)
Pt has oral trauma from seizures-- tongue bleeding.

## 2014-07-12 NOTE — Progress Notes (Signed)
CRITICAL VALUE ALERT  Critical value received:  Troponin 0.42  Date of notification:  07/12/2014  Time of notification:  4827  Critical value read back:Yes.    Nurse who received alert:  Shamica Moree/Crystal Stann Mainland  MD notified (1st page):  Jolyn Lent, [place]  Time of first page:  1340  MD notified (2nd page):  Time of second page:  Responding MD:  Jolyn Lent, PA  Time MD responded:  1340

## 2014-07-12 NOTE — H&P (Signed)
PULMONARY / CRITICAL CARE MEDICINE   Name: Susan Francis MRN: [id number] DOB: Dec 23, 1955    ADMISSION DATE:  07/12/2014 CONSULTATION DATE:  07/12/2014  REFERRING MD :  EDP  CHIEF COMPLAINT:  Seizure  INITIAL PRESENTATION: 37 F with history of seizures, alcohol abuse presented to ED 11/10 after multiple witnessed seizures at home. She required intubation for airway protection in ED. Propofol initiated in ED. Neurology has asked PCCM to admit   SIGNIFICANT EVENTS: 11/10 Admitted with status epilepticus. Intubated in ED. Neuro consult > propofol, Keprra, Vimpat. PCCM admission. High fever. Empiric antibiotics initiated.  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 11/10 EEG:    LINES/TUBES: ETT 11/10 >>    MICRO: PCT 11/10:    11/11:     11/12:  Urine 11/10 >>  Resp  11/10 >>  Blood 11/10 >>   ABX: Vanc 11/10 >>  Ceftriaxone 11/10 >>   HISTORY OF PRESENT ILLNESS:  58 year old female with PMH as below, which includes, Seizures, hepatitis C, and GERD. She takes Keppra at home. 11/10 AM she was noted by husband to have a seizure which he reports at a problem for about 2-3 years. Unknown etiology. After initial siezure she was post ictal for some time but began to normalize. Husband left the house for some time and upon returning he was unable to enter the house as door was locked and wife was having another seizure. Once he gained entry the seizure had stopped and she had not returned to baseline, EMS was called. She seized again during EMS eval. She was brought to ED where she was intubated. PCCM called for ICU admission.   PAST MEDICAL HISTORY :   has a past medical history of Stomach ulcer; Nonspecific abnormal electrocardiogram (ECG) (EKG); Anxiety; GERD (gastroesophageal reflux disease); Seizure; and Hepatitis C.   has past surgical history that includes Cesarean section; Shoulder arthroscopy with rotator cuff repair (Right); Cervical discectomy; Tubal ligation; Tonsillectomy; Dilation and curettage of uterus; skin grafting (Bilateral); and Colonoscopy with propofol (N/A, 07/27/2013). Prior to Admission medications   Medication Sig Start Date End Date Taking? Authorizing Provider  citalopram (CELEXA) 40 MG tablet Take 40 mg by mouth daily.  04/21/14  Yes Historical Provider, MD  levETIRAcetam (KEPPRA) 500 MG tablet Take 1 tablet (500 mg total) by mouth 2 (two) times daily. 06/12/14  Yes Larene Pickett, PA-C  Polyvinyl Alcohol-Povidone (REFRESH OP) Place 1 drop into both eyes 3 (three) times daily as needed (dry eyes).   Yes Historical Provider, MD  potassium chloride SA (K-DUR,KLOR-CON) 20 MEQ tablet Take 20 mEq by mouth daily.  05/05/14  Yes Historical Provider, MD   Allergies  Allergen Reactions  . Adhesive [Tape] Hives and Other (See Comments)    Skin peels off (paper tape is ok)  . Oxycodone Hcl Hives  . Protonix [Pantoprazole Sodium] Itching    Maybe be brand related to the generic per patient    FAMILY HISTORY:  has no family status information on file.  SOCIAL HISTORY:  reports that she has been smoking.  She does not have any smokeless tobacco history on file. She reports that she drinks about 9.0 oz of alcohol per week. She reports that she uses illicit drugs (Marijuana and Benzodiazepines) about 5 times per week.  REVIEW OF SYSTEMS:  Level V caveat  SUBJECTIVE:    VITAL SIGNS: Temp:  [102.7 F (39.3 C)-103.8 F (39.9 C)] 103.8 F (39.9 C) (11/10 1200) Pulse Rate:  [87-130] 130 (11/10 1200) Resp:  [19-30] 27 (11/10 1200) BP: (153-161)/(84-94) 153/85 mmHg (11/10 1200) SpO2:  [100 %] 100 % (11/10 1200) FiO2 (%):  [100 %] 100 % (11/10 1100) Weight:  [65.8 kg (145 lb 1 oz)] 65.8 kg (145 lb 1 oz) (11/10 1034) HEMODYNAMICS:   VENTILATOR SETTINGS: Vent Mode:  [-] PRVC FiO2 (%):  [100 %]  100 % Set Rate:  [16 bmp] 16 bmp Vt Set:  [440 mL] 440 mL PEEP:  [5 cmH20] 5 cmH20 Plateau Pressure:  [18 cmH20] 18 cmH20 INTAKE / OUTPUT: No intake or output data in the 24 hours ending 07/12/14 1207  PHYSICAL EXAMINATION: General:  Female of normal body habitus, on vent Neuro:  Sedated on vent HEENT:  [place]/AT, no JVD noted. ETT in place Cardiovascular:  Tachy, regular, no M Lungs:  Prolonged exp phase, coarse without wheezes Abdomen: soft, NT, diminished BS Ext: warm, no edema Skin:  Remote skin grafting to BLE.   LABS:  CBC  Recent Labs Lab 07/12/14 1055  WBC 9.4  HGB 15.8*  HCT 47.0*  PLT PENDING   Coag's No results for input(s): APTT, INR in the last 168 hours. BMET  Recent Labs Lab 07/12/14 1055  NA 143  K 4.3  CL 102  CO2 12*  BUN 6  CREATININE 0.93  GLUCOSE 156*   Electrolytes  Recent Labs Lab 07/12/14 1055  CALCIUM 8.7   Sepsis Markers No results for input(s): LATICACIDVEN, PROCALCITON, O2SATVEN in the last 168 hours. ABG No results for input(s): PHART, PCO2ART, PO2ART in the last 168 hours. Liver Enzymes  Recent Labs Lab 07/12/14 1055  AST 183*  ALT 96*  ALKPHOS 133*  BILITOT 1.5*  ALBUMIN 3.5   Cardiac Enzymes No results for input(s): TROPONINI, PROBNP in the last 168 hours. Glucose No results for input(s): GLUCAP in the last 168 hours.   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 Smoker, presumed COPD Airflow obstruction without wheezing P:   Cont full vent support - settings reviewed and/or adjusted Cont vent bundle Daily SBT if/when meets criteria Schedule budesonide Scheduled and PRN BDs  CARDIOVASCULAR A:  Tachycardia, sinus - reactive H/o HTN P:  Tele Treat fever, pain Check cardiac markers  RENAL A:   No acute issues P:   Monitor BMET intermittently Monitor I/Os Correct electrolytes as indicated Maintenance IVFs  ordered  GASTROINTESTINAL A:   Hepatitis C Elevated LFTs in a pattern c/w EtOH  H/o GERD P:   Check tylenol, ASA level Check ammonia NPO for now SUP: IV famotidine  HEMATOLOGIC A:   Thrombocytopenia - chronic. Unclear etiology P:  DVT px: SQ heparin Monitor CBC intermittently Transfuse per usual ICU guidelines  INFECTIOUS A:   High fever SIRS, r/o severe sepsis P:   Micro and abx as above PCT algorithm Cooling blanket Antipyretics  ENDOCRINE A:   Mild stress induced hyperglycemia without prior dx of DM P:   Monitor glucose on Bmet Consider SSI for glu > 180  NEUROLOGIC A:   Status epilepticus Acute encephalopathy History of EtOH  P:   AED management per neurology RASS goal: -3 Propofol gtt for sedation/seizure suppression PRN fentanyl  Follow UDS   FAMILY  - Updates:   - Inter-disciplinary family meet or Palliative Care meeting due by:  11/17  Georgann Housekeeper, ACNP [place] [facility]/Critical Care Pager [phone] or [phone]   PCCM ATTENDING: I have interviewed and examined the patient and reviewed the database.  Pt has h/o seizure disorder and alcohol abuse She presented to ED with status epilepticus requiring intubation She has been seen by Neurology and is on propofol  Keppra and Vimpat have been ordered by Neurology PCCM is asked to admit pt In addition to status, she has high fever without a clear infectious source We have sent cultures, PCT algorithm and started empiric abx Antipyretic and PRN cooling blanket are ordered The above note has been reviewed and edited by me Her husband is not available to provide update  CCM X 40 mins  Merton Border, MD;  PCCM service; Mobile [phone]

## 2014-07-12 NOTE — ED Notes (Addendum)
To ED via [facility] from home with c/o continuing seizure activity. On arrival --- pt is seizing. Dr. Darl Householder is at bedside, husband states pt starting having seizures at 5am.
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
## 2014-07-12 NOTE — ED Notes (Signed)
Pt undressed, in gown, on continuous pulse oximetry and blood pressure cuff; RT maintaining airway
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
## 2014-07-12 NOTE — ED Notes (Signed)
PT INTUBATED PER DR YAO -- 7.5ETT, POSITIVE COLOR CHANGE CAPNOGRAPHY,

## 2014-07-12 NOTE — Progress Notes (Signed)
ANTIBIOTIC CONSULT NOTE - INITIAL  Pharmacy Consult for Vancomycin/Ceftriaxone Indication: rule out sepsis  Allergies  Allergen Reactions  . Adhesive [Tape] Hives and Other (See Comments)    Skin peels off (paper tape is ok)  . Oxycodone Hcl Hives  . Protonix [Pantoprazole Sodium] Itching    Maybe be brand related to the generic per patient    Patient Measurements: Height: 5\' 4"  (162.6 cm) Weight: 145 lb 1 oz (65.8 kg) IBW/kg (Calculated) : 54.7  Vital Signs: Temp: 104.2 F (40.1 C) (11/10 1225) Temp Source: Core (Comment) (11/10 1155) BP: 120/69 mmHg (11/10 1300) Pulse Rate: 99 (11/10 1300) Intake/Output from previous day:   Intake/Output from this shift:    Labs:  Recent Labs  07/12/14 1055  WBC 9.4  HGB 15.8*  PLT 77*  CREATININE 0.93   Estimated Creatinine Clearance: 60 mL/min (by C-G formula based on Cr of 0.93). No results for input(s): VANCOTROUGH, VANCOPEAK, VANCORANDOM, GENTTROUGH, GENTPEAK, GENTRANDOM, TOBRATROUGH, TOBRAPEAK, TOBRARND, AMIKACINPEAK, AMIKACINTROU, AMIKACIN in the last 72 hours.   Microbiology: No results found for this or any previous visit (from the past 720 hour(s)).  Medical History: Past Medical History  Diagnosis Date  . Stomach ulcer   . Nonspecific abnormal electrocardiogram (ECG) (EKG)   . Anxiety   . GERD (gastroesophageal reflux disease)   . Seizure     last seizure 4 yrs ago-no recent meds now  . Hepatitis C     dx. '03 -past hx. 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    Medications:  Scheduled:  . budesonide  0.25 mg Nebulization 4 times per day  . cefTRIAXone (ROCEPHIN)  IV  2 g Intravenous Q24H  . famotidine (PEPCID) IV  20 mg Intravenous Q12H  . fentaNYL      . heparin  5,000 Units Subcutaneous 3 times per day  . ipratropium-albuterol  3 mL Nebulization Q6H  . lacosamide (VIMPAT) IV  100 mg Intravenous Q12H  . levETIRAcetam  500 mg Intravenous Q12H  . LORazepam      . LORazepam  2 mg Intramuscular Once  . vancomycin   1,250 mg Intravenous Once  . [START ON 07/13/2014] vancomycin  750 mg Intravenous Q12H   Assessment: 58 yo f who presented to the ED on 11/10 after ongoing seizure activity since 5am.  Patient has a history of seizures and is on Keppra 500 mg BID at home. Patient is noted with a fever of 104.2 on admission, so pharmacy was consulted to dose ceftriaxone and vancomycin for presumed sepsis.  Will give a vancomycin loading dose of 1,250 mg x 1 (~19 mg/kg).  Patient's CrCl is ~60 ml/min and is right between the cut off for 500 mg IV q12hrs and 750 mg IV q12hrs.  Will begin a maintenance dose of 750 mg IV q12hrs and monitor patient's SCr, CrCl and clinical course for adjustment in dose.  Will also begin ceftriaxone 2gm IV q24hrs.  Wbc 9.4, tmax 104.2, SCr 0.93 (baseline ~0.54), CrCl ~60 ml/min.   Vancomycin 11/10 >> Ceftriaxone 11/10 >>  11/10 Bld x2: 11/10 Trach aspirate:  Goal of Therapy:  Vancomycin trough level 15-20 mcg/ml  Plan:  Vancomycin 1,250 mg IV x 1  Followed by vancomycin 750 mg IV q12hrs Ceftriaxone 2gm IV q24hrs VT at [facility] if clinically indicated Monitor CBC, temperature curve, renal function and need for adjustments in doses, C&S, clinical course  Cassie L. Nicole Kindred, PharmD Clinical Pharmacy Resident Pager: [phone] 07/12/2014 1:41 PM

## 2014-07-12 NOTE — ED Notes (Signed)
IV in left forearm infiltrated while in ct scan

## 2014-07-12 NOTE — ED Notes (Signed)
Report to [facility] on 20M

## 2014-07-12 NOTE — Procedures (Signed)
ELECTROENCEPHALOGRAM REPORT   Patient: Susan Francis       Room #: 3Z16 EEG No. ID: [id number] Age: 58 y.o.        Sex: female Referring Physician: Alva Garnet Report Date:  07/12/2014        Interpreting Physician: Alexis Goodell D  History: [name] is an 58 y.o. female presenting with status epilepticus  Medications:  Scheduled: . antiseptic oral rinse  7 mL Mouth Rinse QID  . budesonide  0.25 mg Nebulization 4 times per day  . cefTRIAXone (ROCEPHIN)  IV  2 g Intravenous Q24H  . [START ON 07/13/2014] chlorhexidine  15 mL Mouth Rinse BID  . famotidine (PEPCID) IV  20 mg Intravenous Q12H  . fentaNYL      . ipratropium-albuterol  3 mL Nebulization Q6H  . lacosamide (VIMPAT) IV  100 mg Intravenous Q12H  . levETIRAcetam  500 mg Intravenous Q12H  . LORazepam      . LORazepam  2 mg Intramuscular Once  . vancomycin  1,250 mg Intravenous Once  . [START ON 07/13/2014] vancomycin  750 mg Intravenous Q12H    Conditions of Recording:  This is a 16 channel EEG carried out with the patient in the intubated and sedated state.  Description:  The background activity consists of a low voltage polymorphic delta activity that is continuous and diffusely distributed.  Artifact is present often during the tracing.  No epileptiform activity is noted.  Hyperventilation and intermittent photic stimulation were not performed.  IMPRESSION: This is an abnormal electroencephalogram.  This finding is consistent with the post-ictal state.  No epileptiform activity is noted.     Alexis Goodell, MD [facility] [phone] 07/12/2014, 4:22 PM

## 2014-07-12 NOTE — Progress Notes (Signed)
Stat EEG at bedside completed in ED.  Results pending.

## 2014-07-12 NOTE — Progress Notes (Signed)
EEG changed to LTM per Dr. Doy Mince. Awaiting transfer to 57M.

## 2014-07-12 NOTE — ED Notes (Signed)
TO ct scan.

## 2014-07-12 NOTE — ED Notes (Signed)
Dr simonds at bedside/dr reynolds at bedside

## 2014-07-13 ENCOUNTER — Other Ambulatory Visit (HOSPITAL_COMMUNITY): Payer: No Typology Code available for payment source

## 2014-07-13 ENCOUNTER — Inpatient Hospital Stay (HOSPITAL_COMMUNITY): Payer: Medicare Other

## 2014-07-13 DIAGNOSIS — J9690 Respiratory failure, unspecified, unspecified whether with hypoxia or hypercapnia: Secondary | ICD-10-CM | POA: Insufficient documentation

## 2014-07-13 DIAGNOSIS — G40301 Generalized idiopathic epilepsy and epileptic syndromes, not intractable, with status epilepticus: Secondary | ICD-10-CM

## 2014-07-13 DIAGNOSIS — J9602 Acute respiratory failure with hypercapnia: Secondary | ICD-10-CM

## 2014-07-13 DIAGNOSIS — Z01818 Encounter for other preprocedural examination: Secondary | ICD-10-CM | POA: Insufficient documentation

## 2014-07-13 DIAGNOSIS — J9601 Acute respiratory failure with hypoxia: Secondary | ICD-10-CM | POA: Insufficient documentation

## 2014-07-13 LAB — BASIC METABOLIC PANEL
Anion gap: 13 (ref 5–15)
Anion gap: 14 (ref 5–15)
BUN: 7 mg/dL (ref 6–23)
BUN: 6 mg/dL (ref 6–23)
CALCIUM: 7.8 mg/dL — AB (ref 8.4–10.5)
CO2: 21 meq/L (ref 19–32)
CO2: 21 meq/L (ref 19–32)
CREATININE: 0.8 mg/dL (ref 0.50–1.10)
Calcium: 8.2 mg/dL — ABNORMAL LOW (ref 8.4–10.5)
Chloride: 99 mEq/L (ref 96–112)
Chloride: 109 mEq/L (ref 96–112)
Creatinine, Ser: 0.85 mg/dL (ref 0.50–1.10)
GFR calc Af Amer: >90 mL/min (ref >90)
GFR calc Af Amer: 85 mL/min — ABNORMAL LOW (ref >90)
GFR calc non Af Amer: 73 mL/min — ABNORMAL LOW (ref >90)
GFR, EST NON AFRICAN AMERICAN: 79 mL/min — AB (ref >90)
GLUCOSE: 109 mg/dL — AB (ref 70–99)
Glucose, Bld: 132 mg/dL — ABNORMAL HIGH (ref 70–99)
POTASSIUM: 3.7 meq/L (ref 3.7–5.3)
Potassium: 2.9 mEq/L — CL (ref 3.7–5.3)
Sodium: 133 mEq/L — ABNORMAL LOW (ref 137–147)
Sodium: 144 mEq/L (ref 137–147)

## 2014-07-13 LAB — CBC
HCT: 40.4 % (ref 36.0–46.0)
HEMOGLOBIN: 13.8 g/dL (ref 12.0–15.0)
MCH: 31.9 pg (ref 26.0–34.0)
MCHC: 34.2 g/dL (ref 30.0–36.0)
MCV: 93.3 fL (ref 78.0–100.0)
Platelets: 53 10*3/uL — ABNORMAL LOW (ref 150–400)
RBC: 4.33 MIL/uL (ref 3.87–5.11)
RDW: 15.9 % — ABNORMAL HIGH (ref 11.5–15.5)
WBC: 8.1 10*3/uL (ref 4.0–10.5)

## 2014-07-13 LAB — URINE CULTURE
COLONY COUNT: NO GROWTH
CULTURE: NO GROWTH

## 2014-07-13 LAB — PROCALCITONIN: Procalcitonin: 0.16 ng/mL

## 2014-07-13 LAB — TROPONIN I: Troponin I: 0.85 ng/mL (ref <0.30)

## 2014-07-13 MED ORDER — LORAZEPAM 2 MG/ML IJ SOLN
1.0000 mg | INTRAMUSCULAR | Status: DC | PRN
  Administered 2014-07-13: 1 mg via INTRAVENOUS
  Administered 2014-07-14: 2 mg via INTRAVENOUS
  Filled 2014-07-13 (×3): qty 1

## 2014-07-13 MED ORDER — LEVETIRACETAM IN NACL 1000 MG/100ML IV SOLN
1000.0000 mg | Freq: Two times a day (BID) | INTRAVENOUS | Status: DC
  Administered 2014-07-13 – 2014-07-15 (×5): 1000 mg via INTRAVENOUS
  Filled 2014-07-13 (×6): qty 100

## 2014-07-13 MED ORDER — POTASSIUM CHLORIDE 10 MEQ/100ML IV SOLN
10.0000 meq | INTRAVENOUS | Status: AC
  Administered 2014-07-13 (×8): 10 meq via INTRAVENOUS
  Filled 2014-07-13 (×8): qty 100

## 2014-07-13 MED ORDER — DEXMEDETOMIDINE HCL IN NACL 200 MCG/50ML IV SOLN
0.2000 ug/kg/h | INTRAVENOUS | Status: DC
  Administered 2014-07-13: 0.2 ug/kg/h via INTRAVENOUS
  Administered 2014-07-14 (×2): 0.4 ug/kg/h via INTRAVENOUS
  Filled 2014-07-13 (×3): qty 50

## 2014-07-13 MED ORDER — FENTANYL CITRATE 0.05 MG/ML IJ SOLN
25.0000 ug | INTRAMUSCULAR | Status: DC | PRN
  Administered 2014-07-13: 50 ug via INTRAVENOUS
  Filled 2014-07-13: qty 2

## 2014-07-13 MED ORDER — PNEUMOCOCCAL VAC POLYVALENT 25 MCG/0.5ML IJ INJ: 0.5000 mL | INJECTION | INTRAMUSCULAR | Status: DC

## 2014-07-13 MED ORDER — FENTANYL CITRATE 0.05 MG/ML IJ SOLN
25.0000 ug | INTRAMUSCULAR | Status: DC | PRN
  Administered 2014-07-14 – 2014-07-16 (×6): 25 ug via INTRAVENOUS
  Filled 2014-07-13 (×6): qty 2

## 2014-07-13 MED ORDER — CHLORHEXIDINE GLUCONATE 0.12 % MT SOLN
15.0000 mL | Freq: Two times a day (BID) | OROMUCOSAL | Status: DC
  Administered 2014-07-14: 15 mL via OROMUCOSAL
  Filled 2014-07-13 (×3): qty 15

## 2014-07-13 MED ORDER — HALOPERIDOL LACTATE 5 MG/ML IJ SOLN
1.0000 mg | INTRAMUSCULAR | Status: DC | PRN
  Administered 2014-07-13: 4 mg via INTRAVENOUS
  Filled 2014-07-13: qty 1

## 2014-07-13 MED ORDER — INFLUENZA VAC SPLIT QUAD 0.5 ML IM SUSY: 0.5000 mL | PREFILLED_SYRINGE | INTRAMUSCULAR | Status: DC | PRN

## 2014-07-13 MED ORDER — CETYLPYRIDINIUM CHLORIDE 0.05 % MT LIQD
7.0000 mL | Freq: Two times a day (BID) | OROMUCOSAL | Status: DC
  Administered 2014-07-14 (×2): 7 mL via OROMUCOSAL

## 2014-07-13 NOTE — Procedures (Signed)
  Electroencephalogram report- LTM   Ordering Physician : Dr Doy Mince  EEG number:  [id number]    Data acquisition: 10-20 electrode placement.  Additional T1, T2, and EKG electrodes; 26 channel digital referential acquisition reformatted to 18 channel/7 channel coronal bipolar   Beginning time: 07/12/14 Ending time: 07/13/14  Day of study: day 1    This 14  hours of intensive EEG monitoring with simultaneous video monitoring was performed for this patient  With status epilepticus.  She is now intubated sedated with propofol and Versed also on  vimpat and keppra Medications: as above , intubated and sedated.  There was no pushbutton activations events during this recording.    Throughout the EEG recording background activity was markedt by predominantly low  amplitude delta slowing ranging between 0.5-4 cps distributed broadly. External stimulation background activities become more  attenuated with emergence of muscle artifact and faster frequencies suggestive reactivity.  No clinical or subclinical seizures present.  There is a single suspicious sharp wave present in the left posterior temporal region but not definitive and was not reproducible throughout the recording.  During the last several recording background activities appear to be getting faster with some and the frequencies emerging that attenuated.  Otherwise there were no  epileptiform discharges, clinical or  subclinical seizures present, there  was no obvious interhemispheric asymmetries.  Clinical interpretation: This 14 hours of intensive EEG monitoring with simultaneous monitoring did not record any clinical or subclinical seizures. Background activity were marked  by reactive background activity slowing as discussed above.  Thees findings consistent with severe encephalopathy of nonspecific etiology including toxic metabolic pharmacologic multifocal degenerative etiologies, however in this case likely related to sedation  and postictal state. . Clinical patient is advised.

## 2014-07-13 NOTE — Progress Notes (Signed)
LTVM unhooked per Dr. Doy Mince.

## 2014-07-13 NOTE — Procedures (Signed)
Extubation Procedure Note  Patient Details:   Name: Susan Francis DOB: 23-Nov-1955 MRN: [id number]   Airway Documentation:  AIRWAYS (Active)    Evaluation  O2 sats: stable throughout Complications: No apparent complications Patient did tolerate procedure well. Bilateral Breath Sounds: Clear, Diminished Suctioning: Oral, Airway Yes   Pt extubated to 3L [place], per Dr Lake Bells. Pt able to speak name post extubation with no complications. Positive cuff leak prior. Rt will monitor.   Jesse Sans 07/13/2014, 12:42 PM

## 2014-07-13 NOTE — Progress Notes (Signed)
Patient's family at bedside.  Daughter reported that patient was on keppra for 4 years, which was then discontinued by patient's neurologist because the patient did not have any seizures during this time.  Approximately one month from today, patient had a seizure during an [place] meeting and was restarted on keppra.  Patient, per family report, has been taking keppra everyday since seizure one month ago.  Patient's family also reported that patient has not consumed alcohol in approximately 30 days prior to admission and that patient takes 0.5mg  of Xanax twice a day at home.

## 2014-07-13 NOTE — Progress Notes (Signed)
[name] Progress Note Patient Name: Susan Francis DOB: Oct 20, 1955 MRN: [id number]   Date of Service  07/13/2014  HPI/Events of Note   Pt confused, agitated, combative.  Reportedly drinks mass quantities of vodka on daily basis.   eICU Interventions  Restraints and precedex.     Intervention Category Major Interventions: Other:  Jeydan Barner 07/13/2014, 8:17 PM

## 2014-07-13 NOTE — Progress Notes (Addendum)
Subjective: Patient remains intubated and sedated.  No clinical seizure activity noted overnight.   LTM significant for slowing.    Objective: Current vital signs: BP 99/58 mmHg  Pulse 84  Temp(Src) 98.3 F (36.8 C) (Core (Comment))  Resp 14  Ht 5\' 4"  (1.626 m)  Wt 65.8 kg (145 lb 1 oz)  BMI 24.89 kg/m2  SpO2 97% Vital signs in last 24 hours: Temp:  [98.1 F (36.7 C)-104.2 F (40.1 C)] 98.3 F (36.8 C) (11/11 0630) Pulse Rate:  [77-130] 84 (11/11 0630) Resp:  [12-32] 14 (11/11 0630) BP: (82-161)/(49-94) 99/58 mmHg (11/11 0630) SpO2:  [95 %-100 %] 97 % (11/11 0630) FiO2 (%):  [30 %-100 %] 30 % (11/11 0400) Weight:  [65.8 kg (145 lb 1 oz)] 65.8 kg (145 lb 1 oz) (11/10 1034)  Intake/Output from previous day: 11/10 0701 - 11/11 0700 In: 2668.9 [I.V.:1783.9; IV Piggyback:885] Out: 1210 [Urine:910; Emesis/NG output:300] Intake/Output this shift:   Nutritional status:    Neurologic Exam: Mental Status: Patient awake, eyes open.  Does track examiner.  Does not follow commands.  Intubated.   Cranial Nerves: II: Blinks to confrontation bilaterally, pupils right 3 mm, left 3 mm,and reactivie bilaterally III,IV,VI: EOM's grossly intact V,VII: corneal reflex present bilaterally  VIII: grossly intact IX,X: gag reflex unable to be tested, XI: trapezius strength unable to test bilaterally XII: tongue strength unable to test Motor: Moves all extremities spontaneously against gravity Plantars: upgoing bilaterally Cerebellar: Unable to perform    Lab Results: Basic Metabolic Panel:  Recent Labs Lab 07/12/14 1055 07/12/14 1220 07/13/14 0242  NA 143  --  133*  K 4.3  --  2.9*  CL 102  --  99  CO2 12*  --  21  GLUCOSE 156*  --  132*  BUN 6  --  7  CREATININE 0.93 0.93 0.80  CALCIUM 8.7  --  7.8*    Liver Function Tests:  Recent Labs Lab 07/12/14 1055  AST 183*  ALT 96*  ALKPHOS 133*  BILITOT 1.5*  PROT 7.0  ALBUMIN 3.5   No results for input(s): LIPASE,  AMYLASE in the last 168 hours.  Recent Labs Lab 07/12/14 1220  AMMONIA 57    CBC:  Recent Labs Lab 07/12/14 1055 07/12/14 1405 07/13/14 0242  WBC 9.4 7.2 8.1  NEUTROABS 6.2  --   --   HGB 15.8* 15.0 13.8  HCT 47.0* 43.8 40.4  MCV 97.1 94.2 93.3  PLT 77* 55* 53*    Cardiac Enzymes:  Recent Labs Lab 07/12/14 1220 07/13/14 0242  TROPONINI 0.42* 0.85*    Lipid Panel:  Recent Labs Lab 07/12/14 1630  TRIG 178*    CBG:  Recent Labs Lab 07/12/14 1434  GLUCAP 148*    Microbiology: Results for orders placed or performed during the hospital encounter of 07/12/14  MRSA PCR Screening     Status: None   Collection Time: 07/12/14  1:59 PM  Result Value Ref Range Status   MRSA by PCR NEGATIVE NEGATIVE Final    Comment:        The GeneXpert MRSA Assay (FDA approved for NASAL specimens only), is one component of a comprehensive MRSA colonization surveillance program. It is not intended to diagnose MRSA infection nor to guide or monitor treatment for MRSA infections.     Coagulation Studies: No results for input(s): LABPROT, INR in the last 72 hours.  Imaging: Ct Head Wo Contrast  07/12/2014   CLINICAL DATA:  58 year old female with seizure  this morning. History of alcoholism, HIV and GI bleed. Hepatitis-C. Initial encounter.  EXAM: CT HEAD WITHOUT CONTRAST  TECHNIQUE: Contiguous axial images were obtained from the base of the skull through the vertex without intravenous contrast.  COMPARISON:  06/28/2010 and 09/29/2006 CT. 11/10/2000 MR report. Films not available for review.  FINDINGS: No skull fracture or intracranial hemorrhage.  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  No CT evidence of large acute infarct.  Mild atrophy without hydrocephalus.  Partial opacification left sphenoid sinus air cell.  Orbital structures unremarkable.  IMPRESSION: No skull  fracture or intracranial hemorrhage.  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  No CT evidence of large acute infarct.  Mild atrophy without hydrocephalus.  Partial opacification left sphenoid sinus air cell.   Electronically Signed   By: Chauncey Cruel M.D.   On: 07/12/2014 11:44   Dg Chest Port 1 View  07/13/2014   CLINICAL DATA:  Respiratory failure.  EXAM: PORTABLE CHEST - 1 VIEW  COMPARISON:  Single view of the chest 07/12/2014.  FINDINGS: Endotracheal tube is in place with its tip 2.5 cm above the carina. NG tube courses into the stomach and below the inferior margin of the film. Lungs appear clear. Heart size is normal. No pneumothorax or pleural effusion.  IMPRESSION: Support apparatus projects in good position.  No acute disease.   Electronically Signed   By: Inge Rise M.D.   On: 07/13/2014 05:11   Dg Chest Port 1 View  07/12/2014   CLINICAL DATA:  ET tube placement.  Seizure.  EXAM: PORTABLE CHEST - 1 VIEW  COMPARISON:  10/16/2006  FINDINGS: Endotracheal tube is 3 cm above the carina. NG tube is in the stomach. Heart is normal size. Mediastinal contours are within normal limits. No confluent opacities in the lungs. No effusions. No acute bony abnormality.  IMPRESSION: Endotracheal tube 3 cm above the carina.  No active disease.   Electronically Signed   By: Rolm Baptise M.D.   On: 07/12/2014 10:59    Medications:  I have reviewed the patient's current medications. Scheduled: . antiseptic oral rinse  7 mL Mouth Rinse QID  . budesonide  0.25 mg Nebulization 4 times per day  . cefTRIAXone (ROCEPHIN)  IV  2 g Intravenous Q24H  . chlorhexidine  15 mL Mouth Rinse BID  . famotidine (PEPCID) IV  20 mg Intravenous Q12H  . ipratropium-albuterol  3 mL Nebulization Q6H  . lacosamide (VIMPAT) IV  100 mg Intravenous Q12H  . levETIRAcetam  500 mg Intravenous Q12H  . LORazepam  2 mg  Intramuscular Once  . potassium chloride  10 mEq Intravenous Q1H  . vancomycin  750 mg Intravenous Q12H    Assessment/Plan: Patient presenting in status epilepticus.  Currently intubated and sedated.  No continued seizure activity noted on LTM.  On Keppra and Vimpat currently.    Recommendations: 1.  Increase Keppra to 1000mg  BID 2.  Will continue Vimpat as well at 100mg  BID with plans to taper later.   3.  Agree with tapering of sedation today in preparation for extubation.  4.  LTM to be discontinued today    LOS: 1 day   Alexis Goodell, MD [facility] [phone] 07/13/2014  7:40 AM

## 2014-07-13 NOTE — Progress Notes (Signed)
[facility] ADULT ICU REPLACEMENT PROTOCOL FOR AM LAB REPLACEMENT ONLY  The patient does apply for the [facility] Adult ICU Electrolyte Replacment Protocol based on the criteria listed below:   1. Is GFR >/= 40 ml/min? Yes.    Patient's GFR today is 79 2. Is urine output >/= 0.5 ml/kg/hr for the last 6 hours? Yes.   Patient's UOP is .53 ml/kg/hr 3. Is BUN < 60 mg/dL? Yes.    Patient's BUN today is 7 4. Abnormal electrolyte K  2.9 5. Ordered repletion with: per protocol 6. If a panic level lab has been reported, has the CCM MD in charge been notified? Yes.  .   Physician:  byrum  Christeen Douglas 07/13/2014 4:17 AM

## 2014-07-13 NOTE — Progress Notes (Signed)
PULMONARY / CRITICAL CARE MEDICINE   Name: Susan Francis MRN: [id number] DOB: 1956/06/20    ADMISSION DATE:  07/12/2014 CONSULTATION DATE:  07/12/2014  REFERRING MD:  EDP  CHIEF COMPLAINT:  Seizure  INITIAL PRESENTATION: 11 F with history of seizures, polysubstance abuse (alcohol, benzo, THC) presented to ED 11/10 after multiple witnessed seizures at home. She required intubation for airway protection in ED. Propofol initiated in ED. Neurology asked PCCM to admit   SIGNIFICANT EVENTS: 11/10 Admitted with status epilepticus. Intubated in ED. Neuro consult > propofol, Keprra, Vimpat. PCCM admission. High fever. Empiric antibiotics initiated.  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 11/10 EEG: postictal, no epileptiform activity  LINES/TUBES: ETT 11/10 >>   MICRO: PCT 11/10: <0.10   11/11: 0.16   11/12:  Urine 11/10 >>  Resp  11/10 >>  Blood 11/10 >>   ABX: Vanc 11/10 >> 11/11 Ceftriaxone 11/10 >> 11/11   SUBJECTIVE:  11/11 no additional seizures overnight. RN reports not following commands, but will open her eyes and move in bed.   VITAL SIGNS: Temp:  [98.1 F (36.7 C)-104.2 F (40.1 C)] 98.3 F (36.8 C) (11/11 0630) Pulse Rate:  [77-130] 84 (11/11 0630) Resp:  [12-32] 14 (11/11 0630) BP: (82-161)/(49-94) 99/58 mmHg (11/11 0630) SpO2:  [95 %-100 %] 97 % (11/11 0630) FiO2 (%):  [30 %-100 %] 30 % (11/11 0400) Weight:  [65.8 kg (145 lb 1 oz)] 65.8 kg (145 lb 1 oz) (11/10 1034) HEMODYNAMICS:   VENTILATOR SETTINGS: Vent Mode:  [-] PRVC FiO2 (%):  [30 %-100 %] 30 % Set Rate:  [14 bmp-16 bmp] 14 bmp Vt Set:  [440 mL-500 mL] 500 mL PEEP:  [5 cmH20] 5 cmH20 Plateau Pressure:  [10 cmH20-18 cmH20] 10 cmH20 INTAKE / OUTPUT:  Intake/Output Summary (Last 24 hours) at 07/13/14 0814 Last data filed at 07/13/14  0600  Gross per 24 hour  Intake 2668.93 ml  Output   1210 ml  Net 1458.93 ml    PHYSICAL EXAMINATION: General:  NAD Neuro:  Sedated on vent, eyes open spontaneously and moves all 4 ext but does not follow commands HEENT:  PERRL, constricted 6mm bilat. [place]/AT, no JVD noted. ETT in place Cardiovascular:  Tachycardic, regular rhythm, normal s1s2, no mrg Lungs:  Scatted mild wheezes Abdomen: thin, soft, NT, diminished BS Ext: warm, no edema Skin: no rashes  LABS:  CBC  Recent Labs Lab 07/12/14 1055 07/12/14 1405 07/13/14 0242  WBC 9.4 7.2 8.1  HGB 15.8* 15.0 13.8  HCT 47.0* 43.8 40.4  PLT 77* 55* 53*   Coag's No results for input(s): APTT, INR in the last 168 hours. BMET  Recent Labs Lab 07/12/14 1055 07/12/14 1220 07/13/14 0242  NA 143  --  133*  K 4.3  --  2.9*  CL 102  --  99  CO2 12*  --  21  BUN 6  --  7  CREATININE 0.93 0.93 0.80  GLUCOSE 156*  --  132*   Electrolytes  Recent Labs Lab 07/12/14 1055 07/13/14 0242  CALCIUM 8.7 7.8*   Sepsis Markers  Recent Labs Lab 07/12/14 1220 07/13/14 0242  PROCALCITON <0.10 0.16   ABG No results for input(s): PHART, PCO2ART, PO2ART in the last 168 hours. Liver Enzymes  Recent Labs Lab 07/12/14 1055  AST 183*  ALT 96*  ALKPHOS  133*  BILITOT 1.5*  ALBUMIN 3.5   Cardiac Enzymes  Recent Labs Lab 07/12/14 1220 07/13/14 0242  TROPONINI 0.42* 0.85*   Glucose  Recent Labs Lab 07/12/14 1434  GLUCAP 148*    ASSESSMENT / PLAN: NEUROLOGIC A:   Status epilepticus Acute encephalopathy History of polysubstance abuse (etoh, benzo, thc) UDS pos benzo (?urine collected after ativan given), THC  -11/11 no seizures overnight. Hopeful for wean off sedation today.  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  CARDIOVASCULAR A:  Tachycardia, sinus - reactive H/o HTN  -07/13/14 elevated trop 0.42>0.80  P:  Tele Treat fever, pain Repeat troponin x 1  RENAL A:   Hypokalemia Hyponatremia P:   Monitor BMET intermittently Monitor I/Os Maintenance IVFs ordered Potassium repleted  GASTROINTESTINAL A:   Hepatitis C Elevated LFTs in a pattern c/w EtOH  H/o GERD P:   NPO for now SUP: IV famotidine  HEMATOLOGIC A:   Thrombocytopenia - chronic. Unclear etiology but suspect chronic etoh abuse P:  Monitor CBC intermittently Transfuse per usual ICU guidelines Stop heparin SCDs  INFECTIOUS Vancomycin 11/10>>>11/11 Ceftriaxone 11/10>>>11/11 A:   High fever, resolved SIRS, r/o severe sepsis P:   Micro and abx as above Stop abx  ENDOCRINE A:   Mild stress induced hyperglycemia without prior dx of DM P:   Monitor glucose on Bmet Consider SSI for glu > 180   FAMILY  - Updates: no family at bedside 11/11  - Inter-disciplinary family meet or Palliative Care meeting due by:  11/17  Tawanna Sat, MD 07/13/2014, 8:26 AM PGY-2, [place]  Attending:  I have seen and examined the patient with nurse practitioner/resident and agree with the note above.   Waking up on vent, following commands, intermittently agitated.  Afebrile now, procalcitonin low so this is unlikely to be bacterial meningitis.   Plan extubation, continued seizure medications, appreciate neurology  CC time by me 35 minutes  Roselie Awkward, MD [place] [facility] Pager: [phone] Cell: [phone] If no response, call [phone]

## 2014-07-14 DIAGNOSIS — E039 Hypothyroidism, unspecified: Secondary | ICD-10-CM

## 2014-07-14 DIAGNOSIS — R569 Unspecified convulsions: Secondary | ICD-10-CM

## 2014-07-14 DIAGNOSIS — G9340 Encephalopathy, unspecified: Secondary | ICD-10-CM | POA: Insufficient documentation

## 2014-07-14 LAB — BASIC METABOLIC PANEL
Anion gap: 11 (ref 5–15)
BUN: 6 mg/dL (ref 6–23)
CHLORIDE: 109 meq/L (ref 96–112)
CO2: 21 meq/L (ref 19–32)
CREATININE: 0.84 mg/dL (ref 0.50–1.10)
Calcium: 8 mg/dL — ABNORMAL LOW (ref 8.4–10.5)
GFR calc Af Amer: 86 mL/min — ABNORMAL LOW (ref >90)
GFR calc non Af Amer: 74 mL/min — ABNORMAL LOW (ref >90)
GLUCOSE: 134 mg/dL — AB (ref 70–99)
POTASSIUM: 3.7 meq/L (ref 3.7–5.3)
Sodium: 141 mEq/L (ref 137–147)

## 2014-07-14 LAB — AMMONIA: AMMONIA: 80 umol/L — AB (ref 11–60)

## 2014-07-14 LAB — CBC
HEMATOCRIT: 36.7 % (ref 36.0–46.0)
HEMOGLOBIN: 12.4 g/dL (ref 12.0–15.0)
MCH: 31.5 pg (ref 26.0–34.0)
MCHC: 33.8 g/dL (ref 30.0–36.0)
MCV: 93.1 fL (ref 78.0–100.0)
Platelets: 43 10*3/uL — ABNORMAL LOW (ref 150–400)
RBC: 3.94 MIL/uL (ref 3.87–5.11)
RDW: 15.9 % — ABNORMAL HIGH (ref 11.5–15.5)
WBC: 3.9 10*3/uL — ABNORMAL LOW (ref 4.0–10.5)

## 2014-07-14 LAB — TROPONIN I: Troponin I: <0.3 ng/mL (ref <0.30)

## 2014-07-14 LAB — RPR

## 2014-07-14 LAB — TSH: TSH: 8.96 u[IU]/mL — ABNORMAL HIGH (ref 0.350–4.500)

## 2014-07-14 MED ORDER — FOLIC ACID 5 MG/ML IJ SOLN
1.0000 mg | Freq: Every day | INTRAMUSCULAR | Status: DC
  Administered 2014-07-14: 1 mg via INTRAVENOUS
  Filled 2014-07-14 (×2): qty 0.2

## 2014-07-14 MED ORDER — LORAZEPAM 2 MG/ML IJ SOLN: 1.0000 mg | INTRAMUSCULAR | Status: DC | PRN

## 2014-07-14 MED ORDER — SODIUM CHLORIDE 0.45 % IV SOLN
INTRAVENOUS | Status: DC
  Administered 2014-07-14 – 2014-07-15 (×2): via INTRAVENOUS

## 2014-07-14 MED ORDER — POTASSIUM CHLORIDE 10 MEQ/100ML IV SOLN
10.0000 meq | INTRAVENOUS | Status: AC
  Administered 2014-07-14 (×2): 10 meq via INTRAVENOUS
  Filled 2014-07-14 (×2): qty 100

## 2014-07-14 MED ORDER — THIAMINE HCL 100 MG/ML IJ SOLN
100.0000 mg | Freq: Every day | INTRAMUSCULAR | Status: DC
  Administered 2014-07-14: 100 mg via INTRAVENOUS
  Filled 2014-07-14 (×3): qty 1

## 2014-07-14 MED ORDER — NICOTINE 14 MG/24HR TD PT24
14.0000 mg | MEDICATED_PATCH | Freq: Every day | TRANSDERMAL | Status: DC
  Administered 2014-07-14 – 2014-07-17 (×4): 14 mg via TRANSDERMAL
  Filled 2014-07-14 (×5): qty 1

## 2014-07-14 MED ORDER — LEVOTHYROXINE SODIUM 50 MCG PO TABS
50.0000 ug | ORAL_TABLET | Freq: Every day | ORAL | Status: DC
  Administered 2014-07-15 – 2014-07-17 (×3): 50 ug via ORAL
  Filled 2014-07-14 (×4): qty 1

## 2014-07-14 NOTE — Progress Notes (Addendum)
PULMONARY / CRITICAL CARE MEDICINE   Name: Susan Francis MRN: [id number] DOB: 1955-11-30    ADMISSION DATE:  07/12/2014 CONSULTATION DATE:  07/12/2014  REFERRING MD:  EDP  CHIEF COMPLAINT:  Seizure  INITIAL PRESENTATION: 64 F with history of seizures, polysubstance abuse (alcohol, benzo, THC) presented to ED 11/10 after multiple witnessed seizures at home. She required intubation for airway protection in ED. Propofol initiated in ED. Neurology asked PCCM to admit   SIGNIFICANT EVENTS: 11/10 Admitted with status epilepticus. Intubated in ED. Neuro consult > propofol, Keprra, Vimpat. PCCM admission. High fever. Empiric antibiotics initiated.  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 11/10 EEG: postictal, no epileptiform activity 11/11 extubated, started on precedex for agitation/combative  LINES/TUBES: ETT 11/10 >>   MICRO: PCT 11/10: <0.10   11/11: 0.16   11/12:  Urine 11/10 >> no growth Resp  11/10 >> not collected Blood 11/10 >>   ABX: Vanc 11/10 >> 11/11 Ceftriaxone 11/10 >> 11/11   SUBJECTIVE:  11/12 RN reports agitation overnight requiring restraints and precedex. This AM took off precedex and pt slowly awakening but still very somnolent.   I spoke with dtr again this morning and she is adamant pt has not been drinking any etoh in the past 30 days. Pt is prescribed xanax 0.5mg  BID by her PCP.  VITAL SIGNS: Temp:  [95.6 F (35.3 C)-99.9 F (37.7 C)] 97.2 F (36.2 C) (11/12 0630) Pulse Rate:  [60-110] 60 (11/12 0605) Resp:  [13-28] 13 (11/12 0605) BP: (84-124)/(37-79) 102/48 mmHg (11/12 0605) SpO2:  [96 %-100 %] 96 % (11/12 0605) FiO2 (%):  [30 %] 30 % (11/11 0845) Weight:  [68.8 kg (151 lb 10.8 oz)] 68.8 kg (151 lb 10.8 oz) (11/12 0500) HEMODYNAMICS:   VENTILATOR SETTINGS: Vent Mode:  [-]  PRVC FiO2 (%):  [30 %] 30 % Set Rate:  [14 bmp] 14 bmp Vt Set:  [500 mL] 500 mL PEEP:  [5 cmH20] 5 cmH20 INTAKE / OUTPUT:  Intake/Output Summary (Last 24 hours) at 07/14/14 0721 Last data filed at 07/14/14 9417  Gross per 24 hour  Intake 3279.43 ml  Output   3550 ml  Net -270.57 ml    PHYSICAL EXAMINATION: General:  NAD Neuro:  Oriented x 3, still somnolent and doesn't follow commands. HEENT:  [place]/AT, no JVD noted. ETT in place Cardiovascular:  Tachycardic, regular rhythm, normal s1s2, no mrg Lungs:  Scatted mild wheezes Abdomen: thin, soft, NT, +BS Ext: warm, no edema Skin: no rashes. Spider angiomata on chest, no palmar erythema.  LABS:  CBC  Recent Labs Lab 07/12/14 1405 07/13/14 0242 07/14/14 0221  WBC 7.2 8.1 3.9*  HGB 15.0 13.8 12.4  HCT 43.8 40.4 36.7  PLT 55* 53* 43*   Coag's No results for input(s): APTT, INR in the last 168 hours. BMET  Recent Labs Lab 07/13/14 0242 07/13/14 1950 07/14/14 0221  NA 133* 144 141  K 2.9* 3.7 3.7  CL 99 109 109  CO2 21 21 21   BUN 7 6 6   CREATININE 0.80 0.85 0.84  GLUCOSE 132* 109* 134*   Electrolytes  Recent Labs Lab 07/13/14 0242 07/13/14 1950 07/14/14 0221  CALCIUM 7.8* 8.2* 8.0*   Sepsis Markers  Recent Labs Lab 07/12/14 1220 07/13/14 0242  PROCALCITON <0.10 0.16   ABG No results for input(s): PHART, PCO2ART, PO2ART in the  last 168 hours. Liver Enzymes  Recent Labs Lab 07/12/14 1055  AST 183*  ALT 96*  ALKPHOS 133*  BILITOT 1.5*  ALBUMIN 3.5   Cardiac Enzymes  Recent Labs Lab 07/12/14 1220 07/13/14 0242  TROPONINI 0.42* 0.85*   Glucose  Recent Labs Lab 07/12/14 1434  GLUCAP 148*    ASSESSMENT / PLAN: NEUROLOGIC A:   Status epilepticus, resolved Acute encephalopathy History of polysubstance abuse (etoh, benzo, thc) UDS pos benzo (prescribed), THC  -11/12 precedex overnight, now discontinued this morning and slowly waking up. Dtr adamant pt has been off etoh for past  30 days  P:   Neuro following Keppra 1000mg  BID Vimpat 100mg  BID, taper at later date Will send for additional labs TSH, RPR, repeat ammonia  PULMONARY OETT 11/10>>11/11 A: Smoker, presumed COPD Airflow obstruction without wheezing  -11/12 extubated, stable  P:   Schedule budesonide Scheduled and PRN BDs  CARDIOVASCULAR A:  Tachycardia, sinus - reactive H/o HTN  -11/12 stable  P:  Tele Treat fever, pain  RENAL A:   Hypokalemia, resolved Hyponatremia, resolved P:   Monitor BMET intermittently Monitor I/Os Maintenance IVFs ordered  GASTROINTESTINAL A:   Hepatitis C Elevated LFTs in a pattern c/w EtOH  H/o GERD P:   NPO for now, advance when more awake SUP: IV famotidine Repeat Cmet in AM  HEMATOLOGIC A:   Thrombocytopenia - chronic. Unclear etiology but suspect chronic etoh abuse 07/14/14 no evidence of bleeding  P:  Monitor CBC intermittently SCDs  INFECTIOUS Vancomycin 11/10>>>11/11 Ceftriaxone 11/10>>>11/11 A:   High fever, resolved SIRS, r/o severe sepsis  07/14/14 stable off abx  P:   Monitor for fevers  ENDOCRINE A:   Mild stress induced hyperglycemia without prior dx of DM P:   Monitor glucose on Bmet Consider SSI for glu > 180   FAMILY  - Updates: no family at bedside 11/11. Dtr updated by phone 11/11 and 11/12  - Inter-disciplinary family meet or Palliative Care meeting due by:  11/17  Tawanna Sat, MD 07/14/2014, 7:21 AM PGY-2, [place]  Attending:  I have seen and examined the patient with nurse practitioner/resident and agree with the note above.   She is quite somnolent but amazingly oriented on my exam (to [facility], year).  I strongly question substance abuse given her agitated delirium last night and history of drinking.  However her daughter strongly denies current EtOH abuse.  Continue to monitor in ICU, unrestrain as able, advance diet.    She has hypothyroidism based on  today's labs.  I doubt this is the primary cause of her encephalopathy but we will start synthroid now.  Roselie Awkward, MD [place] [facility] Pager: [phone] Cell: [phone] If no response, call [phone]

## 2014-07-14 NOTE — Progress Notes (Signed)
[facility] ADULT ICU REPLACEMENT PROTOCOL FOR AM LAB REPLACEMENT ONLY  The patient does apply for the [facility] Adult ICU Electrolyte Replacment Protocol based on the criteria listed below:   1. Is GFR >/= 40 ml/min? Yes.    Patient's GFR today is 74 2. Is urine output >/= 0.5 ml/kg/hr for the last 6 hours? Yes.   Patient's UOP is 1.3 ml/kg/hr 3. Is BUN < 60 mg/dL? Yes.    Patient's BUN today is 6 4. Abnormal electrolyte(s): K3.7 5. Ordered repletion with: 68meq-IV 6. If a panic level lab has been reported, has the CCM MD in charge been notified? Yes.  .   Physician:  Earl Many 07/14/2014 5:43 AM

## 2014-07-14 NOTE — Progress Notes (Signed)
Subjective: Patient without further seizures noted.  Extubated yesterday and was combative/agitated.  Now in restraints and on Precedex.    Objective: Current vital signs: BP 91/44 mmHg  Pulse 59  Temp(Src) 97.2 F (36.2 C) (Oral)  Resp 14  Ht 5\' 4"  (1.626 m)  Wt 68.8 kg (151 lb 10.8 oz)  BMI 26.02 kg/m2  SpO2 95% Vital signs in last 24 hours: Temp:  [95.6 F (35.3 C)-99.9 F (37.7 C)] 97.2 F (36.2 C) (11/12 0839) Pulse Rate:  [59-110] 59 (11/12 0700) Resp:  [13-28] 14 (11/12 0700) BP: (84-124)/(37-79) 91/44 mmHg (11/12 0700) SpO2:  [95 %-100 %] 95 % (11/12 0700) Weight:  [68.8 kg (151 lb 10.8 oz)] 68.8 kg (151 lb 10.8 oz) (11/12 0500)  Intake/Output from previous day: 11/11 0701 - 11/12 0700 In: 3386.1 [I.V.:2416.1; IV Piggyback:970] Out: 7711 [Urine:3550] Intake/Output this shift: Total I/O In: 100 [IV Piggyback:100] Out: 75 [Urine:75] Nutritional status:    Neurologic Exam: Mental Status: Lethargic.  Awakens with sternal rub.  Speech fluent.  Follows commands.   Cranial Nerves: II: Discs flat bilaterally; Visual fields grossly normal, pupils equal, round, reactive to light and accommodation III,IV, VI: ptosis not present, extra-ocular motions intact bilaterally V,VII: no facial asymmetry noted, facial light touch sensation normal bilaterally VIII: hearing normal bilaterally IX,X: gag reflex present XI: bilateral shoulder shrug XII: midline tongue extension Motor: Moves all extremities symmetrically against gravity.   Sensory: Responds to noxious stimuli throughout Deep Tendon Reflexes: 2+ and symmetric throughout Plantars: Right: mute   Left: mute   Lab Results: Basic Metabolic Panel:  Recent Labs Lab 07/12/14 1055 07/12/14 1220 07/13/14 0242 07/13/14 1950 07/14/14 0221  NA 143  --  133* 144 141  K 4.3  --  2.9* 3.7 3.7  CL 102  --  99 109 109  CO2 12*  --  21 21 21   GLUCOSE 156*  --  132* 109* 134*  BUN 6  --  7 6 6   CREATININE 0.93 0.93 0.80  0.85 0.84  CALCIUM 8.7  --  7.8* 8.2* 8.0*    Liver Function Tests:  Recent Labs Lab 07/12/14 1055  AST 183*  ALT 96*  ALKPHOS 133*  BILITOT 1.5*  PROT 7.0  ALBUMIN 3.5   No results for input(s): LIPASE, AMYLASE in the last 168 hours.  Recent Labs Lab 07/12/14 1220  AMMONIA 57    CBC:  Recent Labs Lab 07/12/14 1055 07/12/14 1405 07/13/14 0242 07/14/14 0221  WBC 9.4 7.2 8.1 3.9*  NEUTROABS 6.2  --   --   --   HGB 15.8* 15.0 13.8 12.4  HCT 47.0* 43.8 40.4 36.7  MCV 97.1 94.2 93.3 93.1  PLT 77* 55* 53* 43*    Cardiac Enzymes:  Recent Labs Lab 07/12/14 1220 07/13/14 0242  TROPONINI 0.42* 0.85*    Lipid Panel:  Recent Labs Lab 07/12/14 1630  TRIG 178*    CBG:  Recent Labs Lab 07/12/14 1434  GLUCAP 148*    Microbiology: Results for orders placed or performed during the hospital encounter of 07/12/14  Culture, blood (routine x 2)     Status: None (Preliminary result)   Collection Time: 07/12/14 12:10 PM  Result Value Ref Range Status   Specimen Description BLOOD LEFT ANTECUBITAL  Final   Special Requests BOTTLES DRAWN AEROBIC AND ANAEROBIC 10MLS  Final   Culture  Setup Time   Final    07/12/2014 16:57 Performed at [place]   Final  BLOOD CULTURE RECEIVED NO GROWTH TO DATE CULTURE WILL BE HELD FOR 5 DAYS BEFORE ISSUING A FINAL NEGATIVE REPORT Performed at [facility]    Report Status PENDING  Incomplete  Culture, blood (routine x 2)     Status: None (Preliminary result)   Collection Time: 07/12/14 12:20 PM  Result Value Ref Range Status   Specimen Description BLOOD LEFT HAND  Final   Special Requests BOTTLES DRAWN AEROBIC AND ANAEROBIC 10MLS  Final   Culture  Setup Time   Final    07/12/2014 16:56 Performed at [facility]    Culture   Final           BLOOD CULTURE RECEIVED NO GROWTH TO DATE CULTURE WILL BE HELD FOR 5 DAYS BEFORE ISSUING A FINAL NEGATIVE REPORT Performed at [facility]    Report Status PENDING  Incomplete  MRSA PCR Screening     Status: None   Collection Time: 07/12/14  1:59 PM  Result Value Ref Range Status   MRSA by PCR NEGATIVE NEGATIVE Final    Comment:        The GeneXpert MRSA Assay (FDA approved for NASAL specimens only), is one component of a comprehensive MRSA colonization surveillance program. It is not intended to diagnose MRSA infection nor to guide or monitor treatment for MRSA infections.   Culture, Urine     Status: None   Collection Time: 07/12/14  1:59 PM  Result Value Ref Range Status   Specimen Description URINE, CATHETERIZED  Final   Special Requests NONE  Final   Culture  Setup Time   Final    07/12/2014 22:15 Performed at [place] Performed at [facility]   Final   Culture NO GROWTH Performed at [facility]   Final   Report Status 07/13/2014 FINAL  Final    Coagulation Studies: No results for input(s): LABPROT, INR in the last 72 hours.  Imaging: Ct Head Wo Contrast  07/12/2014   CLINICAL DATA:  58 year old female with seizure this morning. History of alcoholism, HIV and GI bleed. Hepatitis-C. Initial encounter.  EXAM: CT HEAD WITHOUT CONTRAST  TECHNIQUE: Contiguous axial images were obtained from the base of the skull through the vertex without intravenous contrast.  COMPARISON:  06/28/2010 and 09/29/2006 CT. 11/10/2000 MR report. Films not available for review.  FINDINGS: No skull fracture or intracranial hemorrhage.  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  No CT evidence of large acute infarct.  Mild atrophy without hydrocephalus.  Partial opacification left sphenoid sinus air cell.  Orbital structures unremarkable.  IMPRESSION: No skull fracture or intracranial hemorrhage.  Suprasellar mass with transverse dimension of 1.3 x 1.3 cm without  significant change. 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  No CT evidence of large acute infarct.  Mild atrophy without hydrocephalus.  Partial opacification left sphenoid sinus air cell.   Electronically Signed   By: Chauncey Cruel M.D.   On: 07/12/2014 11:44   Dg Chest Port 1 View  07/13/2014   CLINICAL DATA:  Respiratory failure.  EXAM: PORTABLE CHEST - 1 VIEW  COMPARISON:  Single view of the chest 07/12/2014.  FINDINGS: Endotracheal tube is in place with its tip 2.5 cm above the carina. NG tube courses into the stomach and below the inferior margin of  the film. Lungs appear clear. Heart size is normal. No pneumothorax or pleural effusion.  IMPRESSION: Support apparatus projects in good position.  No acute disease.   Electronically Signed   By: Inge Rise M.D.   On: 07/13/2014 05:11   Dg Chest Port 1 View  07/12/2014   CLINICAL DATA:  ET tube placement.  Seizure.  EXAM: PORTABLE CHEST - 1 VIEW  COMPARISON:  10/16/2006  FINDINGS: Endotracheal tube is 3 cm above the carina. NG tube is in the stomach. Heart is normal size. Mediastinal contours are within normal limits. No confluent opacities in the lungs. No effusions. No acute bony abnormality.  IMPRESSION: Endotracheal tube 3 cm above the carina.  No active disease.   Electronically Signed   By: Rolm Baptise M.D.   On: 07/12/2014 10:59    Medications:  I have reviewed the patient's current medications. Scheduled: . antiseptic oral rinse  7 mL Mouth Rinse q12n4p  . budesonide  0.25 mg Nebulization 4 times per day  . chlorhexidine  15 mL Mouth Rinse BID  . famotidine (PEPCID) IV  20 mg Intravenous Q12H  . folic acid  1 mg Intravenous Daily  . ipratropium-albuterol  3 mL Nebulization Q6H  . lacosamide (VIMPAT) IV  100 mg Intravenous Q12H  . levETIRAcetam  1,000 mg Intravenous Q12H  . nicotine  14 mg Transdermal Daily  . thiamine  100 mg Intravenous Daily    Assessment/Plan: Patient with altered mental status.   Unclear if this may be related to alcohol or her seizure activity.  Patient may be having some prolonged mental status issues since her seizures were so prolonged.  Ammonia within normal limits.  Patient remains on Keppra and Vimpat.    Recommendations: 1.  Continue Keppra and Vimpat at current doses.   2.  Continue seizure precautions 3.  Agree with Ativan prn   LOS: 2 days   Alexis Goodell, MD [facility] [phone] 07/14/2014  9:12 AM

## 2014-07-15 ENCOUNTER — Other Ambulatory Visit (HOSPITAL_COMMUNITY): Payer: No Typology Code available for payment source

## 2014-07-15 DIAGNOSIS — E876 Hypokalemia: Secondary | ICD-10-CM

## 2014-07-15 LAB — COMPREHENSIVE METABOLIC PANEL
ALK PHOS: 88 U/L (ref 39–117)
ALT: 67 U/L — AB (ref 0–35)
AST: 134 U/L — AB (ref 0–37)
Albumin: 2.8 g/dL — ABNORMAL LOW (ref 3.5–5.2)
Anion gap: 13 (ref 5–15)
BILIRUBIN TOTAL: 2.1 mg/dL — AB (ref 0.3–1.2)
BUN: 8 mg/dL (ref 6–23)
CHLORIDE: 108 meq/L (ref 96–112)
CO2: 21 meq/L (ref 19–32)
CREATININE: 0.93 mg/dL (ref 0.50–1.10)
Calcium: 8.4 mg/dL (ref 8.4–10.5)
GFR calc Af Amer: 76 mL/min — ABNORMAL LOW (ref >90)
GFR, EST NON AFRICAN AMERICAN: 65 mL/min — AB (ref >90)
Glucose, Bld: 91 mg/dL (ref 70–99)
POTASSIUM: 3.5 meq/L — AB (ref 3.7–5.3)
SODIUM: 142 meq/L (ref 137–147)
Total Protein: 5.7 g/dL — ABNORMAL LOW (ref 6.0–8.3)

## 2014-07-15 MED ORDER — ACETAMINOPHEN 500 MG PO TABS: 500.0000 mg | ORAL_TABLET | ORAL | Status: DC | PRN

## 2014-07-15 MED ORDER — IPRATROPIUM-ALBUTEROL 0.5-2.5 (3) MG/3ML IN SOLN
3.0000 mL | Freq: Four times a day (QID) | RESPIRATORY_TRACT | Status: DC
  Administered 2014-07-16 – 2014-07-17 (×6): 3 mL via RESPIRATORY_TRACT
  Filled 2014-07-15 (×8): qty 3

## 2014-07-15 MED ORDER — POTASSIUM CHLORIDE CRYS ER 20 MEQ PO TBCR
40.0000 meq | EXTENDED_RELEASE_TABLET | Freq: Once | ORAL | Status: AC
  Administered 2014-07-15: 40 meq via ORAL
  Filled 2014-07-15: qty 2

## 2014-07-15 MED ORDER — LACOSAMIDE 50 MG PO TABS
50.0000 mg | ORAL_TABLET | Freq: Two times a day (BID) | ORAL | Status: AC
  Administered 2014-07-15 – 2014-07-17 (×4): 50 mg via ORAL
  Filled 2014-07-15 (×4): qty 1

## 2014-07-15 MED ORDER — POTASSIUM CHLORIDE CRYS ER 20 MEQ PO TBCR: 40.0000 meq | EXTENDED_RELEASE_TABLET | Freq: Once | ORAL | Status: DC

## 2014-07-15 MED ORDER — VITAMIN B-1 100 MG PO TABS
100.0000 mg | ORAL_TABLET | Freq: Every day | ORAL | Status: DC
  Administered 2014-07-15 – 2014-07-17 (×3): 100 mg via ORAL
  Filled 2014-07-15 (×3): qty 1

## 2014-07-15 MED ORDER — BUDESONIDE 0.25 MG/2ML IN SUSP
0.2500 mg | Freq: Two times a day (BID) | RESPIRATORY_TRACT | Status: DC
Start: 2014-07-16 — End: 2014-07-17
  Administered 2014-07-16 – 2014-07-17 (×3): 0.25 mg via RESPIRATORY_TRACT
  Filled 2014-07-15 (×5): qty 2

## 2014-07-15 MED ORDER — LORAZEPAM 2 MG/ML IJ SOLN: 1.0000 mg | Freq: Four times a day (QID) | INTRAMUSCULAR | Status: DC | PRN

## 2014-07-15 MED ORDER — LEVETIRACETAM 500 MG PO TABS
1000.0000 mg | ORAL_TABLET | Freq: Two times a day (BID) | ORAL | Status: DC
  Administered 2014-07-15 – 2014-07-17 (×4): 1000 mg via ORAL
  Filled 2014-07-15 (×5): qty 2

## 2014-07-15 MED ORDER — FOLIC ACID 1 MG PO TABS
1.0000 mg | ORAL_TABLET | Freq: Every day | ORAL | Status: DC
  Administered 2014-07-15 – 2014-07-17 (×3): 1 mg via ORAL
  Filled 2014-07-15 (×3): qty 1

## 2014-07-15 MED ORDER — LACTULOSE 10 GM/15ML PO SOLN
30.0000 g | Freq: Two times a day (BID) | ORAL | Status: DC
  Administered 2014-07-15 – 2014-07-17 (×5): 30 g via ORAL
  Filled 2014-07-15 (×6): qty 45

## 2014-07-15 MED ORDER — HALOPERIDOL LACTATE 5 MG/ML IJ SOLN: 1.0000 mg | INTRAMUSCULAR | Status: DC | PRN

## 2014-07-15 NOTE — Progress Notes (Signed)
Medicare Important Message given? YES  (If response is "NO", the following Medicare IM given date fields will be blank)  Date Medicare IM given:  Medicare IM given by:  Brantlee Penn Medicare Important Message given? YES  (If response is "NO", the following Medicare IM given date fields will be blank)  Date Medicare IM given: 07/15/14 Medicare IM given by:  Dahlia Client [employer]

## 2014-07-15 NOTE — Progress Notes (Signed)
[place] TEAM 1 - Stepdown/ICU TEAM Progress Note  EURA MCCAUSLIN JGG:[id number] DOB: [birth year] DOA: 07/12/2014 PCP: Horton Finer, MD  Admit HPI / Brief Narrative: 58 year old female with hx of Seizures, hepatitis C, and GERD who was noted by husband to have a seizure.  After initial siezure she was post ictal for some time but began to normalize. Husband left the house for some time and upon returning he was unable to enter the house as door was locked and wife was having another seizure. Once he gained entry the seizure had stopped and she had not returned to baseline.  EMS was called. She seized again during EMS eval. She was brought to ED where she was intubated. PCCM admitted her to the ICU.  Significant Events: 11/10 Admitted with status epilepticus. Intubated in ED. Neuro consult > propofol, Keprra, Vimpat. PCCM admission. High fever. Empiric antibiotics initiated.  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 11/10 EEG: postictal, no epileptiform activity 11/11 extubated, started on precedex for agitation/combative  HPI/Subjective: Pt is lethargic at the time of my exam.  She answers some questions and seems to ignore others.  She can not provide a reliable hx.    Assessment/Plan:  Status epilepticus Resolved - Neuro assisting w/ tx   SIRS No evidence of sepsis   Acute encephalopathy - ?hepatic encephalopathy  Initiate lactulose and follow   History of polysubstance abuse (etoh, benzo, thc) - UDS pos benzo (prescribed) Admitted w/ EtOH abuse and benzo abuse Sept 2015  Smoker, presumed COPD resp status currently stable   Tachycardia, sinus reactive  Hypokalemia Replete and follow - check Mg  Hyponatremia resolved  Hepatitis C  Elevated LFTs in a pattern c/w EtOH - follow trend    Thrombocytopenia - chronic Unclear etiology but suspect chronic etoh abuse  Newly diagnosed hypothyroidism On synthroid - will need f/u TSH in 8-12 weeks   Code Status: FULL Family Communication: no family present at time of exam Disposition Plan: SDU  Consultants: Neuro   Antibiotics: Vanc 11/10 > 11/11 Ceftriaxone 11/10 > 11/11  DVT prophylaxis: SCDs  Objective: Blood pressure 118/78, pulse 88, temperature 97.8 F (36.6 C), temperature source Oral, resp. rate 14, height 5\' 4"  (1.626 m), weight 68.9 kg (151 lb 14.4 oz), SpO2 97 %.  Intake/Output Summary (Last 24 hours) at 07/15/14 1011 Last data filed at 07/15/14 0946  Gross per 24 hour  Intake 2117.5 ml  Output   1600 ml  Net  517.5 ml   Exam: General: No acute respiratory distress - lethargic - confused  Lungs: Clear to auscultation bilaterally without wheezes or crackles Cardiovascular: Regular rhythm without murmur gallop or rub normal S1 and S2 Abdomen: Nontender, nondistended, soft, bowel sounds positive, no rebound, no ascites, no appreciable mass Extremities: No significant cyanosis, clubbing, or edema bilateral lower extremities  Data Reviewed: Basic Metabolic Panel:  Recent Labs Lab 07/12/14 1055 07/12/14 1220 07/13/14 0242 07/13/14 1950 07/14/14 0221 07/15/14 0303  NA 143  --  133* 144 141 142  K 4.3  --  2.9* 3.7 3.7 3.5*  CL 102  --  99 109 109 108  CO2 12*  --  21 21 21 21   GLUCOSE 156*  --  132* 109* 134* 91  BUN 6  --  7 6 6 8   CREATININE 0.93 0.93 0.80 0.85  0.84 0.93  CALCIUM 8.7  --  7.8* 8.2* 8.0* 8.4    Liver Function Tests:  Recent Labs Lab 07/12/14 1055 07/15/14 0303  AST 183* 134*  ALT 96* 67*  ALKPHOS 133* 88  BILITOT 1.5* 2.1*  PROT 7.0 5.7*  ALBUMIN 3.5 2.8*    Recent Labs Lab 07/12/14 1220 07/14/14 1215  AMMONIA 57 80*    Coags: No results for input(s): INR in the last 168 hours.  Invalid input(s): PT No results for input(s): APTT in the last 168  hours.  CBC:  Recent Labs Lab 07/12/14 1055 07/12/14 1405 07/13/14 0242 07/14/14 0221  WBC 9.4 7.2 8.1 3.9*  NEUTROABS 6.2  --   --   --   HGB 15.8* 15.0 13.8 12.4  HCT 47.0* 43.8 40.4 36.7  MCV 97.1 94.2 93.3 93.1  PLT 77* 55* 53* 43*    Cardiac Enzymes:  Recent Labs Lab 07/12/14 1220 07/13/14 0242 07/14/14 1035  TROPONINI 0.42* 0.85* <0.30   CBG:  Recent Labs Lab 07/12/14 1434  GLUCAP 148*    Recent Results (from the past 240 hour(s))  Culture, blood (routine x 2)     Status: None (Preliminary result)   Collection Time: 07/12/14 12:10 PM  Result Value Ref Range Status   Specimen Description BLOOD LEFT ANTECUBITAL  Final   Special Requests BOTTLES DRAWN AEROBIC AND ANAEROBIC 10MLS  Final   Culture  Setup Time   Final    07/12/2014 16:57 Performed at [facility]    Culture   Final           BLOOD CULTURE RECEIVED NO GROWTH TO DATE CULTURE WILL BE HELD FOR 5 DAYS BEFORE ISSUING A FINAL NEGATIVE REPORT Performed at [facility]    Report Status PENDING  Incomplete  Culture, blood (routine x 2)     Status: None (Preliminary result)   Collection Time: 07/12/14 12:20 PM  Result Value Ref Range Status   Specimen Description BLOOD LEFT HAND  Final   Special Requests BOTTLES DRAWN AEROBIC AND ANAEROBIC 10MLS  Final   Culture  Setup Time   Final    07/12/2014 16:56 Performed at [facility]    Culture   Final           BLOOD CULTURE RECEIVED NO GROWTH TO DATE CULTURE WILL BE HELD FOR 5 DAYS BEFORE ISSUING A FINAL NEGATIVE REPORT Performed at [facility]    Report Status PENDING  Incomplete  MRSA PCR Screening     Status: None   Collection Time: 07/12/14  1:59 PM  Result Value Ref Range Status   MRSA by PCR NEGATIVE NEGATIVE Final    Comment:        The GeneXpert MRSA Assay (FDA approved for NASAL specimens only), is one component of a comprehensive MRSA colonization surveillance program. It is not intended to diagnose  MRSA infection nor to guide or monitor treatment for MRSA infections.   Culture, Urine     Status: None   Collection Time: 07/12/14  1:59 PM  Result Value Ref Range Status   Specimen Description URINE, CATHETERIZED  Final   Special Requests NONE  Final   Culture  Setup Time   Final    07/12/2014 22:15 Performed at [place] Performed at [facility]   Final   Culture NO GROWTH Performed at [facility]   Final   Report Status 07/13/2014 FINAL  Final  Studies:  Recent x-ray studies have been reviewed in detail by the Attending Physician  Scheduled Meds:  Scheduled Meds: . budesonide  0.25 mg Nebulization 4 times per day  . folic acid  1 mg Oral Daily  . ipratropium-albuterol  3 mL Nebulization Q6H  . lacosamide (VIMPAT) IV  100 mg Intravenous Q12H  . levETIRAcetam  1,000 mg Intravenous Q12H  . levothyroxine  50 mcg Oral QAC breakfast  . nicotine  14 mg Transdermal Daily  . thiamine  100 mg Oral Daily    Time spent on care of this patient: 35 mins   Jeralynn Vaquera T , MD   [facility] Office  [phone] Pager - Text Page per Shea Evans as per below:  On-Call/Text Page:      [email or web address]      password [email or web address]  If 7PM-7AM, please contact night-coverage [email or web address] Password [email or web address] 07/15/2014, 10:11 AM   LOS: 3 days

## 2014-07-15 NOTE — Progress Notes (Addendum)
Subjective: No further seizures noted.  Keppra increased.  Patient tolerating well.    Objective: Current vital signs: BP 118/78 mmHg  Pulse 88  Temp(Src) 97.8 F (36.6 C) (Oral)  Resp 14  Ht 5\' 4"  (1.626 m)  Wt 68.9 kg (151 lb 14.4 oz)  BMI 26.06 kg/m2  SpO2 97% Vital signs in last 24 hours: Temp:  [97.8 F (36.6 C)-99 F (37.2 C)] 97.8 F (36.6 C) (11/13 0734) Pulse Rate:  [58-90] 88 (11/13 0734) Resp:  [14-24] 14 (11/13 0734) BP: (86-123)/(39-78) 118/78 mmHg (11/13 0734) SpO2:  [91 %-100 %] 97 % (11/13 0923) Weight:  [68.9 kg (151 lb 14.4 oz)] 68.9 kg (151 lb 14.4 oz) (11/13 0500)  Intake/Output from previous day: 11/12 0701 - 11/13 0700 In: 2248.4 [P.O.:240; I.V.:1723.4; IV Piggyback:285] Out: 1150 [Urine:1150] Intake/Output this shift: Total I/O In: 417.5 [I.V.:282.5; IV Piggyback:135] Out: 550 [Urine:550] Nutritional status: Diet regular  Neurologic Exam: Mental Status: Alert, oriented, thought content appropriate.  Speech fluent without evidence of aphasia.  Able to follow 3 step commands without difficulty. Cranial Nerves: II: Discs flat bilaterally; Visual fields grossly normal, pupils equal, round, reactive to light and accommodation III,IV, VI: ptosis not present, extra-ocular motions intact bilaterally V,VII: smile symmetric, facial light touch sensation normal bilaterally VIII: hearing normal bilaterally IX,X: gag reflex present XI: bilateral shoulder shrug XII: midline tongue extension Motor: 5/5 with limited strength and ROM at the right shoulder due to previous injury Sensory: Pinprick and light touch intact throughout, bilaterally Deep Tendon Reflexes: 2+ and symmetric throughout Plantars: Right: mute   Left: mute Cerebellar: normal finger-to-nose and normal heel-to-shin test   Lab Results: Basic Metabolic Panel:  Recent Labs Lab 07/12/14 1055 07/12/14 1220 07/13/14 0242 07/13/14 1950 07/14/14 0221 07/15/14 0303  NA 143  --  133* 144  141 142  K 4.3  --  2.9* 3.7 3.7 3.5*  CL 102  --  99 109 109 108  CO2 12*  --  21 21 21 21   GLUCOSE 156*  --  132* 109* 134* 91  BUN 6  --  7 6 6 8   CREATININE 0.93 0.93 0.80 0.85 0.84 0.93  CALCIUM 8.7  --  7.8* 8.2* 8.0* 8.4    Liver Function Tests:  Recent Labs Lab 07/12/14 1055 07/15/14 0303  AST 183* 134*  ALT 96* 67*  ALKPHOS 133* 88  BILITOT 1.5* 2.1*  PROT 7.0 5.7*  ALBUMIN 3.5 2.8*   No results for input(s): LIPASE, AMYLASE in the last 168 hours.  Recent Labs Lab 07/12/14 1220 07/14/14 1215  AMMONIA 57 80*    CBC:  Recent Labs Lab 07/12/14 1055 07/12/14 1405 07/13/14 0242 07/14/14 0221  WBC 9.4 7.2 8.1 3.9*  NEUTROABS 6.2  --   --   --   HGB 15.8* 15.0 13.8 12.4  HCT 47.0* 43.8 40.4 36.7  MCV 97.1 94.2 93.3 93.1  PLT 77* 55* 53* 43*    Cardiac Enzymes:  Recent Labs Lab 07/12/14 1220 07/13/14 0242 07/14/14 1035  TROPONINI 0.42* 0.85* <0.30    Lipid Panel:  Recent Labs Lab 07/12/14 1630  TRIG 178*    CBG:  Recent Labs Lab 07/12/14 1434  GLUCAP 148*    Microbiology: Results for orders placed or performed during the hospital encounter of 07/12/14  Culture, blood (routine x 2)     Status: None (Preliminary result)   Collection Time: 07/12/14 12:10 PM  Result Value Ref Range Status   Specimen Description BLOOD LEFT ANTECUBITAL  Final   Special Requests BOTTLES DRAWN AEROBIC AND ANAEROBIC 10MLS  Final   Culture  Setup Time   Final    07/12/2014 16:57 Performed at [facility]    Culture   Final           BLOOD CULTURE RECEIVED NO GROWTH TO DATE CULTURE WILL BE HELD FOR 5 DAYS BEFORE ISSUING A FINAL NEGATIVE REPORT Performed at [facility]    Report Status PENDING  Incomplete  Culture, blood (routine x 2)     Status: None (Preliminary result)   Collection Time: 07/12/14 12:20 PM  Result Value Ref Range Status   Specimen Description BLOOD LEFT HAND  Final   Special Requests BOTTLES DRAWN AEROBIC AND  ANAEROBIC 10MLS  Final   Culture  Setup Time   Final    07/12/2014 16:56 Performed at [facility]    Culture   Final           BLOOD CULTURE RECEIVED NO GROWTH TO DATE CULTURE WILL BE HELD FOR 5 DAYS BEFORE ISSUING A FINAL NEGATIVE REPORT Performed at [facility]    Report Status PENDING  Incomplete  MRSA PCR Screening     Status: None   Collection Time: 07/12/14  1:59 PM  Result Value Ref Range Status   MRSA by PCR NEGATIVE NEGATIVE Final    Comment:        The GeneXpert MRSA Assay (FDA approved for NASAL specimens only), is one component of a comprehensive MRSA colonization surveillance program. It is not intended to diagnose MRSA infection nor to guide or monitor treatment for MRSA infections.   Culture, Urine     Status: None   Collection Time: 07/12/14  1:59 PM  Result Value Ref Range Status   Specimen Description URINE, CATHETERIZED  Final   Special Requests NONE  Final   Culture  Setup Time   Final    07/12/2014 22:15 Performed at [place] Performed at [facility]   Final   Culture NO GROWTH Performed at [facility]   Final   Report Status 07/13/2014 FINAL  Final    Coagulation Studies: No results for input(s): LABPROT, INR in the last 72 hours.  Imaging: No results found.  Medications:  I have reviewed the patient's current medications. Scheduled: . budesonide  0.25 mg Nebulization 4 times per day  . folic acid  1 mg Oral Daily  . ipratropium-albuterol  3 mL Nebulization Q6H  . lacosamide (VIMPAT) IV  100 mg Intravenous Q12H  . levETIRAcetam  1,000 mg Intravenous Q12H  . levothyroxine  50 mcg Oral QAC breakfast  . nicotine  14 mg Transdermal Daily  . thiamine  100 mg Oral Daily    Assessment/Plan: Patient stable. No further seizures.  Mental status improved.  Keppra increased to 1000mg  BID.  Patient now able to take po.    Recommendations: 1.  Continue Keppra at 1000mg   BID. Will change to po today. 2.  Continue seizure precautions 3.  Change Vimpat to po and decreased dose to 50mg  BID.  Plans to decrease completely in 2-3 days    LOS: 3 days   Alexis Goodell, MD [facility] [phone] 07/15/2014  10:13 AM

## 2014-07-16 LAB — CBC
HEMATOCRIT: 39.2 % (ref 36.0–46.0)
Hemoglobin: 13.4 g/dL (ref 12.0–15.0)
MCH: 32.3 pg (ref 26.0–34.0)
MCHC: 34.2 g/dL (ref 30.0–36.0)
MCV: 94.5 fL (ref 78.0–100.0)
PLATELETS: 53 10*3/uL — AB (ref 150–400)
RBC: 4.15 MIL/uL (ref 3.87–5.11)
RDW: 15.6 % — ABNORMAL HIGH (ref 11.5–15.5)
WBC: 3.3 10*3/uL — ABNORMAL LOW (ref 4.0–10.5)

## 2014-07-16 LAB — MAGNESIUM: MAGNESIUM: 1.9 mg/dL (ref 1.5–2.5)

## 2014-07-16 LAB — COMPREHENSIVE METABOLIC PANEL
ALT: 64 U/L — ABNORMAL HIGH (ref 0–35)
AST: 123 U/L — AB (ref 0–37)
Albumin: 2.7 g/dL — ABNORMAL LOW (ref 3.5–5.2)
Alkaline Phosphatase: 91 U/L (ref 39–117)
Anion gap: 15 (ref 5–15)
BILIRUBIN TOTAL: 2.4 mg/dL — AB (ref 0.3–1.2)
BUN: 3 mg/dL — ABNORMAL LOW (ref 6–23)
CHLORIDE: 108 meq/L (ref 96–112)
CO2: 21 mEq/L (ref 19–32)
CREATININE: 0.83 mg/dL (ref 0.50–1.10)
Calcium: 8.6 mg/dL (ref 8.4–10.5)
GFR calc non Af Amer: 75 mL/min — ABNORMAL LOW (ref >90)
GFR, EST AFRICAN AMERICAN: 87 mL/min — AB (ref >90)
GLUCOSE: 123 mg/dL — AB (ref 70–99)
Potassium: 3.5 mEq/L — ABNORMAL LOW (ref 3.7–5.3)
Sodium: 144 mEq/L (ref 137–147)
Total Protein: 5.8 g/dL — ABNORMAL LOW (ref 6.0–8.3)

## 2014-07-16 LAB — AMMONIA: Ammonia: 14 umol/L (ref 11–60)

## 2014-07-16 MED ORDER — POTASSIUM CHLORIDE CRYS ER 20 MEQ PO TBCR
40.0000 meq | EXTENDED_RELEASE_TABLET | Freq: Two times a day (BID) | ORAL | Status: AC
  Administered 2014-07-16 – 2014-07-17 (×3): 40 meq via ORAL
  Filled 2014-07-16 (×3): qty 2

## 2014-07-16 MED ORDER — ACETAMINOPHEN 325 MG PO TABS
325.0000 mg | ORAL_TABLET | ORAL | Status: DC | PRN
  Filled 2014-07-16: qty 1

## 2014-07-16 MED ORDER — CITALOPRAM HYDROBROMIDE 20 MG PO TABS
20.0000 mg | ORAL_TABLET | Freq: Every day | ORAL | Status: DC
  Administered 2014-07-16 – 2014-07-17 (×2): 20 mg via ORAL
  Filled 2014-07-16 (×2): qty 1

## 2014-07-16 NOTE — Progress Notes (Signed)
NURSING PROGRESS NOTE  Susan Francis [id number] Transfer Data: 07/16/2014 1:57 PM Attending Provider: Cherene Altes, MD OIN:[facility],NOBSJ Juanda Crumble, MD Code Status: FUll  Susan Francis is a 58 y.o. female patient transferred from [place]  -No acute distress noted.  -No complaints of shortness of breath.  -No complaints of chest pain.     Blood pressure 123/71, pulse 84, temperature 98.7 F (37.1 C), temperature source Oral, resp. rate 22, height 5\' 4"  (1.626 m), weight 70.2 kg (154 lb 12.2 oz), SpO2 100 %.   IV Fluids:  IV in place, occlusive dsg intact without redness, IV cath Right Forearm, and Right A/C, SL.   Allergies:  Adhesive; Oxycodone hcl; and Protonix  Past Medical History:   has a past medical history of Stomach ulcer; Nonspecific abnormal electrocardiogram (ECG) (EKG); Anxiety; GERD (gastroesophageal reflux disease); Seizure; and Hepatitis C.  Past Surgical History:   has past surgical history that includes Cesarean section; Shoulder arthroscopy with rotator cuff repair (Right); Cervical discectomy; Tubal ligation; Tonsillectomy; Dilation and curettage of uterus; skin grafting (Bilateral); and Colonoscopy with propofol (N/A, 07/27/2013).  Social History:   reports that she has been smoking.  She does not have any smokeless tobacco history on file. She reports that she drinks about 9.0 oz of alcohol per week. She reports that she uses illicit drugs (Marijuana and Benzodiazepines) about 5 times per week.  Skin: Intact.   Patient/Family orientated to room. Information packet given to patient/family. Admission inpatient armband information verified with patient/family to include name and date of birth and placed on patient arm. Side rails up x 2, fall assessment and education completed with patient/family. Patient/family able to verbalize understanding of risk associated with falls and verbalized understanding to call for assistance before getting out of bed. Call light within  reach. Patient/family able to voice and demonstrate understanding of unit orientation instructions.    Will continue to evaluate and treat per MD orders.

## 2014-07-16 NOTE — Progress Notes (Signed)
[place] TEAM 1 - Stepdown/ICU TEAM Progress Note  Susan Francis YYT:[id number] DOB: 10/23/1955 DOA: 07/12/2014 PCP: Horton Finer, MD  Admit HPI / Brief Narrative: 58 year old female with hx of seizures, hepatitis C, polysubstance abuse, and GERD who was noted by husband to have a seizure.  After initial siezure she was post ictal for some time but began to normalize. Husband left the house for some time and upon returning he was unable to enter the house as door was locked and wife was having another seizure. Once he gained entry the seizure had stopped and she had not returned to baseline.  EMS was called. She seized again during EMS eval. She was brought to ED where she was intubated. PCCM admitted her to the ICU.  Significant Events: 11/10 Admitted with status epilepticus. Intubated in ED. Neuro consult > propofol, Kepra, Vimpat. PCCM admission. High fever. Empiric antibiotics initiated.  11/10 CT head: No skull fracture or intracranial hemorrhage. 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 11/10 EEG: postictal, no epileptiform activity 11/11 extubated, started on precedex for agitation/combative 11/12 precedex discontinued - pt transferred to SDU   HPI/Subjective: Much more alert today, and oriented.  Admits to last drink of EtOH 15 days ago.  States she has been comletely off xanax for over a month.  Currently she c/o sore throat (was intubated), sore tongue (due to sz), and severe generalized weakness.    Assessment/Plan:  Status epilepticus - Seizure disorder  Status resolved - Neuro assisting w/ tx - meds being adjusted per Neuro - appears to be well controlled at this time   SIRS No evidence of sepsis - received only one day of abx - no signs of infection   Acute encephalopathy - ?hepatic encephalopathy  Initiated lactulose - ammonia now normalized - continue  lactulose and follow   History of polysubstance abuse (etoh, benzo, thc) admitted w/ EtOH abuse and benzo abuse Sept 2015 - pt admits to failing in her attempts to quit, but has now been 15 days w/o EtOH use - she states she has been benzo free for 30 days after a slow benzo taper directed by Psychiatry (UDS + benzo at admit likely due to that given to arrest sz) - she understands the absolute importance of abstinence in the face of Hep C cirrhosis, and that ongoing drinking will prevent her from being a tx candidate   Smoker, presumed COPD resp status currently stable   Tachycardia, sinus Reactive - resolved   Hypokalemia Replete further and follow - Mg is normal   Hyponatremia Resolved - due to EtOH abuse and volume depletion  Hepatitis C - cirrhosis  Followed by Dr. Baxter Flattery at [facility] - has been undergoing eval for possible Harvoni tx - was tx w/ interferon 12 week course remotely, but stopped due to poor response and side effects - [place] on 05/24/2014 noted cirrhotic architecture - to f/u at [facility] after d/c   Elevated LFTs in a pattern c/w EtOH, but not improving - suspect due to cirrhosis - albumin is low at 2.7 - will check coags  Thrombocytopenia - chronic Unclear etiology but suspect chronic etoh abuse + cirrhosis - plts stable presently   Newly diagnosed hypothyroidism On synthroid - will need f/u TSH in 8-12 weeks   Hyperglycemia No known hx of DM - check A1c  Code Status: FULL Family Communication:  no family present at time of exam Disposition Plan: stable for transfer to med bed - begin PT/OT as pt may require a rehab stay prior to d/c home   Consultants: Neuro  PCCM  Antibiotics: Vanc 11/10  Ceftriaxone 11/10   DVT prophylaxis: SCDs  Objective: Blood pressure 136/68, pulse 100, temperature 98.8 F (37.1 C), temperature source Oral, resp. rate 18, height 5\' 4"  (1.626 m), weight 70.2 kg (154 lb 12.2 oz), SpO2 100 %.  Intake/Output Summary (Last 24  hours) at 07/16/14 1033 Last data filed at 07/16/14 0800  Gross per 24 hour  Intake 2208.75 ml  Output   1478 ml  Net 730.75 ml   Exam: General: No acute respiratory distress - alert and oriented  Lungs: Clear to auscultation bilaterally without wheezes or crackles Cardiovascular: Regular rate and rhythm without murmur gallop or rub Abdomen: Nontender, nondistended, soft, bowel sounds positive, no rebound, no ascites, no appreciable mass Extremities: No significant cyanosis, clubbing, or edema bilateral lower extremities  Data Reviewed: Basic Metabolic Panel:  Recent Labs Lab 07/13/14 0242 07/13/14 1950 07/14/14 0221 07/15/14 0303 07/16/14 0324  NA 133* 144 141 142 144  K 2.9* 3.7 3.7 3.5* 3.5*  CL 99 109 109 108 108  CO2 21 21 21 21 21   GLUCOSE 132* 109* 134* 91 123*  BUN 7 6 6 8  3*  CREATININE 0.80 0.85 0.84 0.93 0.83  CALCIUM 7.8* 8.2* 8.0* 8.4 8.6  MG  --   --   --   --  1.9    Liver Function Tests:  Recent Labs Lab 07/12/14 1055 07/15/14 0303 07/16/14 0324  AST 183* 134* 123*  ALT 96* 67* 64*  ALKPHOS 133* 88 91  BILITOT 1.5* 2.1* 2.4*  PROT 7.0 5.7* 5.8*  ALBUMIN 3.5 2.8* 2.7*    Recent Labs Lab 07/12/14 1220 07/14/14 1215 07/16/14 0324  AMMONIA 57 80* 14    Coags: No results for input(s): INR in the last 168 hours.  Invalid input(s): PT No results for input(s): APTT in the last 168 hours.  CBC:  Recent Labs Lab 07/12/14 1055 07/12/14 1405 07/13/14 0242 07/14/14 0221 07/16/14 0324  WBC 9.4 7.2 8.1 3.9* 3.3*  NEUTROABS 6.2  --   --   --   --   HGB 15.8* 15.0 13.8 12.4 13.4  HCT 47.0* 43.8 40.4 36.7 39.2  MCV 97.1 94.2 93.3 93.1 94.5  PLT 77* 55* 53* 43* 53*    Cardiac Enzymes:  Recent Labs Lab 07/12/14 1220 07/13/14 0242 07/14/14 1035  TROPONINI 0.42* 0.85* <0.30   CBG:  Recent Labs Lab 07/12/14 1434  GLUCAP 148*    Recent Results (from the past 240 hour(s))  Culture, blood (routine x 2)     Status: None  (Preliminary result)   Collection Time: 07/12/14 12:10 PM  Result Value Ref Range Status   Specimen Description BLOOD LEFT ANTECUBITAL  Final   Special Requests BOTTLES DRAWN AEROBIC AND ANAEROBIC 10MLS  Final   Culture  Setup Time   Final    07/12/2014 16:57 Performed at [facility]    Culture   Final           BLOOD CULTURE RECEIVED NO GROWTH TO DATE CULTURE WILL BE HELD FOR 5 DAYS BEFORE ISSUING A FINAL NEGATIVE REPORT Performed at [facility]    Report Status PENDING  Incomplete  Culture, blood (routine x 2)     Status: None (Preliminary result)   Collection Time: 07/12/14 12:20 PM  Result Value Ref Range Status   Specimen Description BLOOD LEFT HAND  Final   Special Requests BOTTLES DRAWN AEROBIC AND ANAEROBIC 10MLS  Final   Culture  Setup Time   Final    07/12/2014 16:56 Performed at [facility]    Culture   Final           BLOOD CULTURE RECEIVED NO GROWTH TO DATE CULTURE WILL BE HELD FOR 5 DAYS BEFORE ISSUING A FINAL NEGATIVE REPORT Performed at [facility]    Report Status PENDING  Incomplete  MRSA PCR Screening     Status: None   Collection Time: 07/12/14  1:59 PM  Result Value Ref Range Status   MRSA by PCR NEGATIVE NEGATIVE Final    Comment:        The GeneXpert MRSA Assay (FDA approved for NASAL specimens only), is one component of a comprehensive MRSA colonization surveillance program. It is not intended to diagnose MRSA infection nor to guide or monitor treatment for MRSA infections.   Culture, Urine     Status: None   Collection Time: 07/12/14  1:59 PM  Result Value Ref Range Status   Specimen Description URINE, CATHETERIZED  Final   Special Requests NONE  Final   Culture  Setup Time   Final    07/12/2014 22:15 Performed at [place] Performed at [facility]   Final   Culture NO GROWTH Performed at [facility]   Final   Report Status 07/13/2014 FINAL   Final     Studies:  Recent x-ray studies have been reviewed in detail by the Attending Physician  Scheduled Meds:  Scheduled Meds: . budesonide  0.25 mg Nebulization BID  . folic acid  1 mg Oral Daily  . ipratropium-albuterol  3 mL Nebulization QID  . lacosamide  50 mg Oral BID  . lactulose  30 g Oral BID  . levETIRAcetam  1,000 mg Oral BID  . levothyroxine  50 mcg Oral QAC breakfast  . nicotine  14 mg Transdermal Daily  . thiamine  100 mg Oral Daily    Time spent on care of this patient: 35 mins   Giliana Vantil T , MD   [facility] Office  [phone] Pager - Text Page per Shea Evans as per below:  On-Call/Text Page:      [email or web address]      password [email or web address]  If 7PM-7AM, please contact night-coverage [email or web address] Password [email or web address] 07/16/2014, 10:33 AM   LOS: 4 days

## 2014-07-16 NOTE — Progress Notes (Signed)
Report called to [facility] RN on 5W. Pt being transferred to 5W20.

## 2014-07-16 NOTE — Evaluation (Signed)
Physical Therapy Evaluation Patient Details Name: Susan Francis MRN: [id number] DOB: [birth year] Today's Date: 07/16/2014   History of Present Illness  Pt is a 58 y/o F with history of seizures, alcohol/drug abuse, who presented to the ED 11/10 after multiple witnessed seizures at home. She required intubation for airway protection in ED. Pt was extubated on 07/13/14.  Clinical Impression  Pt admitted with the above. Pt currently with functional limitations due to the deficits listed below (see PT Problem List). At the time of PT eval pt was able to perform transfers and ambulation with occasional min assist. As pt progresses may not require PT follow up at d/c. Pt will benefit from skilled PT to increase their independence and safety with mobility to allow discharge to the venue listed below.       Follow Up Recommendations Home health PT;Supervision - Intermittent    Equipment Recommendations  Cane    Recommendations for Other Services       Precautions / Restrictions Precautions Precautions: Fall Restrictions Weight Bearing Restrictions: No      Mobility  Bed Mobility Overal bed mobility: Needs Assistance Bed Mobility: Supine to Sit     Supine to sit: Min guard     General bed mobility comments: Pt was able to transition to EOB with min guard assist for trunk support/stability initially. Was able to maintain upright seated balance well.   Transfers Overall transfer level: Needs assistance Equipment used: None Transfers: Sit to/from Stand Sit to Stand: Min guard         General transfer comment: Pt was able to power-up to full standing position without physical assistance. Close guard for safety.   Ambulation/Gait Ambulation/Gait assistance: Min assist Ambulation Distance (Feet): 150 Feet Assistive device: 1 person hand held assist Gait Pattern/deviations: Step-through pattern;Decreased stride length;Narrow base of support Gait velocity: Decreased Gait velocity  interpretation: Below normal speed for age/gender General Gait Details: Pt was able to ambulate with occasional min assist for unsteadiness. Pt began gait training with no assistance and progressed to HHA and then min assist as pt fatigued.   Stairs            Wheelchair Mobility    Modified Rankin (Stroke Patients Only)       Balance                                             Pertinent Vitals/Pain Pain Assessment: No/denies pain    Home Living Family/patient expects to be discharged to:: Private residence Living Arrangements: Spouse/significant other Available Help at Discharge: Family;Available 24 hours/day Type of Home: Mobile home Home Access: Stairs to enter Entrance Stairs-Rails: Left Entrance Stairs-Number of Steps: 7 Home Layout: One level Home Equipment: None      Prior Function Level of Independence: Independent         Comments: Still driving, does cooking and cleaning at home, does grocery shopping.      Hand Dominance   Dominant Hand: Right    Extremity/Trunk Assessment   Upper Extremity Assessment: Defer to OT evaluation           Lower Extremity Assessment: Generalized weakness      Cervical / Trunk Assessment: Normal  Communication   Communication: No difficulties  Cognition Arousal/Alertness: Awake/alert Behavior During Therapy: WFL for tasks assessed/performed Overall Cognitive Status: Within Functional Limits for tasks assessed  General Comments      Exercises        Assessment/Plan    PT Assessment Patient needs continued PT services  PT Diagnosis Difficulty walking;Generalized weakness   PT Problem List Decreased strength;Decreased range of motion;Decreased activity tolerance;Decreased balance;Decreased mobility;Decreased safety awareness;Decreased knowledge of use of DME;Decreased knowledge of precautions  PT Treatment Interventions DME instruction;Gait  training;Stair training;Functional mobility training;Therapeutic activities;Therapeutic exercise;Neuromuscular re-education;Patient/family education   PT Goals (Current goals can be found in the Care Plan section) Acute Rehab PT Goals Patient Stated Goal: To return home independently PT Goal Formulation: With patient Time For Goal Achievement: 07/23/14 Potential to Achieve Goals: Good    Frequency Min 3X/week   Barriers to discharge        Co-evaluation               End of Session Equipment Utilized During Treatment: Gait belt Activity Tolerance: Patient limited by fatigue Patient left: in chair;with call bell/phone within reach Nurse Communication: Mobility status         Time: 3729-0211 PT Time Calculation (min) (ACUTE ONLY): 18 min   Charges:   PT Evaluation $Initial PT Evaluation Tier I: 1 Procedure PT Treatments $Gait Training: 8-22 mins   PT G Codes:          Rolinda Roan 07/16/2014, 3:25 PM   Rolinda Roan, PT, DPT Acute Rehabilitation Services Pager: [phone]

## 2014-07-16 NOTE — Progress Notes (Signed)
Subjective: No further seizures noted.  Patient very tearful this morning.  On Vimpat and Keppra.    Objective: Current vital signs: BP 136/68 mmHg  Pulse 100  Temp(Src) 98.8 F (37.1 C) (Oral)  Resp 18  Ht 5\' 4"  (1.626 m)  Wt 70.2 kg (154 lb 12.2 oz)  BMI 26.55 kg/m2  SpO2 100% Vital signs in last 24 hours: Temp:  [98 F (36.7 C)-98.8 F (37.1 C)] 98.8 F (37.1 C) (11/14 0825) Pulse Rate:  [81-100] 100 (11/14 0825) Resp:  [17-19] 18 (11/14 0825) BP: (121-158)/(68-87) 136/68 mmHg (11/14 0825) SpO2:  [95 %-100 %] 100 % (11/14 0825) Weight:  [70.2 kg (154 lb 12.2 oz)] 70.2 kg (154 lb 12.2 oz) (11/14 0312)  Intake/Output from previous day: 11/13 0701 - 11/14 0700 In: 2816.3 [P.O.:1080; I.V.:1601.3; IV Piggyback:135] Out: 2028 [Urine:2026; Stool:2] Intake/Output this shift: Total I/O In: 50 [I.V.:50] Out: -  Nutritional status: Diet regular  Neurologic Exam: Mental Status: Alert, oriented, thought content appropriate.  Speech fluent without evidence of aphasia.  Able to follow 3 step commands without difficulty. Cranial Nerves: II: Discs flat bilaterally; Visual fields grossly normal, pupils equal, round, reactive to light and accommodation III,IV, VI: ptosis not present, extra-ocular motions intact bilaterally V,VII: smile symmetric, facial light touch sensation normal bilaterally VIII: hearing normal bilaterally IX,X: gag reflex present XI: bilateral shoulder shrug XII: midline tongue extension Motor: 5/5 with RUE limitation due to rotator cuff injury Sensory: Pinprick and light touch intact throughout, bilaterally Deep Tendon Reflexes: 2+ and symmetric throughout   Lab Results: Basic Metabolic Panel:  Recent Labs Lab 07/13/14 0242 07/13/14 1950 07/14/14 0221 07/15/14 0303 07/16/14 0324  NA 133* 144 141 142 144  K 2.9* 3.7 3.7 3.5* 3.5*  CL 99 109 109 108 108  CO2 21 21 21 21 21   GLUCOSE 132* 109* 134* 91 123*  BUN 7 6 6 8  3*  CREATININE 0.80 0.85  0.84 0.93 0.83  CALCIUM 7.8* 8.2* 8.0* 8.4 8.6  MG  --   --   --   --  1.9    Liver Function Tests:  Recent Labs Lab 07/12/14 1055 07/15/14 0303 07/16/14 0324  AST 183* 134* 123*  ALT 96* 67* 64*  ALKPHOS 133* 88 91  BILITOT 1.5* 2.1* 2.4*  PROT 7.0 5.7* 5.8*  ALBUMIN 3.5 2.8* 2.7*   No results for input(s): LIPASE, AMYLASE in the last 168 hours.  Recent Labs Lab 07/12/14 1220 07/14/14 1215 07/16/14 0324  AMMONIA 57 80* 14    CBC:  Recent Labs Lab 07/12/14 1055 07/12/14 1405 07/13/14 0242 07/14/14 0221 07/16/14 0324  WBC 9.4 7.2 8.1 3.9* 3.3*  NEUTROABS 6.2  --   --   --   --   HGB 15.8* 15.0 13.8 12.4 13.4  HCT 47.0* 43.8 40.4 36.7 39.2  MCV 97.1 94.2 93.3 93.1 94.5  PLT 77* 55* 53* 43* 53*    Cardiac Enzymes:  Recent Labs Lab 07/12/14 1220 07/13/14 0242 07/14/14 1035  TROPONINI 0.42* 0.85* <0.30    Lipid Panel:  Recent Labs Lab 07/12/14 1630  TRIG 178*    CBG:  Recent Labs Lab 07/12/14 1434  GLUCAP 148*    Microbiology: Results for orders placed or performed during the hospital encounter of 07/12/14  Culture, blood (routine x 2)     Status: None (Preliminary result)   Collection Time: 07/12/14 12:10 PM  Result Value Ref Range Status   Specimen Description BLOOD LEFT ANTECUBITAL  Final   Special  Requests BOTTLES DRAWN AEROBIC AND ANAEROBIC 10MLS  Final   Culture  Setup Time   Final    07/12/2014 16:57 Performed at [facility]    Culture   Final           BLOOD CULTURE RECEIVED NO GROWTH TO DATE CULTURE WILL BE HELD FOR 5 DAYS BEFORE ISSUING A FINAL NEGATIVE REPORT Performed at [facility]    Report Status PENDING  Incomplete  Culture, blood (routine x 2)     Status: None (Preliminary result)   Collection Time: 07/12/14 12:20 PM  Result Value Ref Range Status   Specimen Description BLOOD LEFT HAND  Final   Special Requests BOTTLES DRAWN AEROBIC AND ANAEROBIC 10MLS  Final   Culture  Setup Time   Final     07/12/2014 16:56 Performed at [facility]    Culture   Final           BLOOD CULTURE RECEIVED NO GROWTH TO DATE CULTURE WILL BE HELD FOR 5 DAYS BEFORE ISSUING A FINAL NEGATIVE REPORT Performed at [facility]    Report Status PENDING  Incomplete  MRSA PCR Screening     Status: None   Collection Time: 07/12/14  1:59 PM  Result Value Ref Range Status   MRSA by PCR NEGATIVE NEGATIVE Final    Comment:        The GeneXpert MRSA Assay (FDA approved for NASAL specimens only), is one component of a comprehensive MRSA colonization surveillance program. It is not intended to diagnose MRSA infection nor to guide or monitor treatment for MRSA infections.   Culture, Urine     Status: None   Collection Time: 07/12/14  1:59 PM  Result Value Ref Range Status   Specimen Description URINE, CATHETERIZED  Final   Special Requests NONE  Final   Culture  Setup Time   Final    07/12/2014 22:15 Performed at [place] Performed at [facility]   Final   Culture NO GROWTH Performed at [facility]   Final   Report Status 07/13/2014 FINAL  Final    Coagulation Studies: No results for input(s): LABPROT, INR in the last 72 hours.  Imaging: No results found.  Medications:  I have reviewed the patient's current medications. Scheduled: . budesonide  0.25 mg Nebulization BID  . folic acid  1 mg Oral Daily  . ipratropium-albuterol  3 mL Nebulization QID  . lacosamide  50 mg Oral BID  . lactulose  30 g Oral BID  . levETIRAcetam  1,000 mg Oral BID  . levothyroxine  50 mcg Oral QAC breakfast  . nicotine  14 mg Transdermal Daily  . potassium chloride  40 mEq Oral BID  . thiamine  100 mg Oral Daily    Assessment/Plan: No further seizures noted.  Patient tolerating increase in [place].  Vimpat being tapered.    Recommendations: 1.  Would D/C Vimpat after AM dose tomorrow 2.  Continue Keppra at 1000mg  BID.     LOS: 4  days   Alexis Goodell, MD [facility] [phone] 07/16/2014  11:38 AM

## 2014-07-17 DIAGNOSIS — J9601 Acute respiratory failure with hypoxia: Secondary | ICD-10-CM

## 2014-07-17 LAB — CBC
HEMATOCRIT: 39.8 % (ref 36.0–46.0)
Hemoglobin: 13.6 g/dL (ref 12.0–15.0)
MCH: 32.3 pg (ref 26.0–34.0)
MCHC: 34.2 g/dL (ref 30.0–36.0)
MCV: 94.5 fL (ref 78.0–100.0)
Platelets: 61 10*3/uL — ABNORMAL LOW (ref 150–400)
RBC: 4.21 MIL/uL (ref 3.87–5.11)
RDW: 15.6 % — ABNORMAL HIGH (ref 11.5–15.5)
WBC: 3.4 10*3/uL — AB (ref 4.0–10.5)

## 2014-07-17 LAB — COMPREHENSIVE METABOLIC PANEL
ALT: 64 U/L — AB (ref 0–35)
AST: 110 U/L — AB (ref 0–37)
Albumin: 3 g/dL — ABNORMAL LOW (ref 3.5–5.2)
Alkaline Phosphatase: 90 U/L (ref 39–117)
Anion gap: 12 (ref 5–15)
BUN: 3 mg/dL — ABNORMAL LOW (ref 6–23)
CALCIUM: 8.7 mg/dL (ref 8.4–10.5)
CO2: 23 meq/L (ref 19–32)
CREATININE: 0.86 mg/dL (ref 0.50–1.10)
Chloride: 107 mEq/L (ref 96–112)
GFR calc Af Amer: 83 mL/min — ABNORMAL LOW (ref >90)
GFR calc non Af Amer: 72 mL/min — ABNORMAL LOW (ref >90)
Glucose, Bld: 90 mg/dL (ref 70–99)
Potassium: 4.2 mEq/L (ref 3.7–5.3)
SODIUM: 142 meq/L (ref 137–147)
TOTAL PROTEIN: 6 g/dL (ref 6.0–8.3)
Total Bilirubin: 2 mg/dL — ABNORMAL HIGH (ref 0.3–1.2)

## 2014-07-17 LAB — PROTIME-INR
INR: 1.23 (ref 0.00–1.49)
PROTHROMBIN TIME: 15.7 s — AB (ref 11.6–15.2)

## 2014-07-17 LAB — HEMOGLOBIN A1C
HEMOGLOBIN A1C: 5.2 % (ref <5.7)
MEAN PLASMA GLUCOSE: 103 mg/dL (ref <117)

## 2014-07-17 LAB — APTT: APTT: 35 s (ref 24–37)

## 2014-07-17 MED ORDER — LEVOTHYROXINE SODIUM 50 MCG PO TABS: 50.0000 ug | ORAL_TABLET | Freq: Every day | ORAL | Status: DC

## 2014-07-17 MED ORDER — LACTULOSE 10 GM/15ML PO SOLN: 30.0000 g | Freq: Two times a day (BID) | ORAL | Status: DC

## 2014-07-17 MED ORDER — POTASSIUM CHLORIDE CRYS ER 20 MEQ PO TBCR: 20.0000 meq | EXTENDED_RELEASE_TABLET | Freq: Every day | ORAL | Status: DC

## 2014-07-17 MED ORDER — LEVETIRACETAM 1000 MG PO TABS: 1000.0000 mg | ORAL_TABLET | Freq: Two times a day (BID) | ORAL | Status: DC

## 2014-07-17 MED ORDER — CITALOPRAM HYDROBROMIDE 20 MG PO TABS: 20.0000 mg | ORAL_TABLET | Freq: Every day | ORAL | Status: DC

## 2014-07-17 NOTE — Care Management Note (Signed)
    Page 1 of 2   07/17/2014     4:12:25 PM CARE MANAGEMENT NOTE 07/17/2014  Patient:  Susan Francis, Susan Francis   Account Number:  [id number]  Date Initiated:  07/12/2014  Documentation initiated by:  [facility]  Subjective/Objective Assessment:   Admitted to ICU post seizure requring intubation.     Action/Plan:   discharge planning   Anticipated DC Date:  07/19/2014   Anticipated DC Plan:  [place]  CM consult      [facility] Choice  HOME HEALTH   Choice offered to / List presented to:  C-1 Patient   DME arranged  [facility]      DME agency  [place] arranged  [place].   Status of service:  Completed, signed off Medicare Important Message given?  YES (If response is "NO", the following Medicare IM given date fields will be blank) Date Medicare IM given:  07/15/2014 Medicare IM given by:  Marvetta Gibbons Date Additional Medicare IM given:   Additional Medicare IM given by:    Discharge Disposition:  [place]  Per UR Regulation:  Reviewed for med. necessity/level of care/duration of stay  If discussed at [place] of Stay Meetings, dates discussed:    Comments:  07/17/14 13:00 MD called CM to please discuss pt's inability to afford medications. Pt has Blue Medicare Complete and is not elligible for MATCH.  CM gave pt a [employer] Patient Asst form which Pt states she will fill out and take to her PCP to complete so her meds are more affordable.  CM offered choice for home health agency. Pt chooses [facility] to render HHPT.  Address and contact information verified with pt.  CM called DME rep, Jeneen Rinks to please deliver cane to room prior to dishcarge.  Referral called to [facility] rep, Jamie for HHPT.  No other CM needs were communicated.  Mariane Masters, BSN, CM [phone].   Contact: Tripett,Christy Daughter [phone]  [phone]                   [place] Son [phone]  [phone]

## 2014-07-17 NOTE — Progress Notes (Signed)
Nsg Discharge Note  Admit Date:  07/12/2014 Discharge date: 07/17/2014   [name] to be D/C'd Home with home health per MD order.  AVS completed.  Copy for chart, and copy for patient signed, and dated. Patient/caregiver able to verbalize understanding.  Discharge Medication:   Medication List    TAKE these medications        citalopram 20 MG tablet  Commonly known as:  CELEXA  Take 1 tablet (20 mg total) by mouth daily.     lactulose 10 GM/15ML solution  Commonly known as:  CHRONULAC  Take 45 mLs (30 g total) by mouth 2 (two) times daily.     levETIRAcetam 1000 MG tablet  Commonly known as:  KEPPRA  Take 1 tablet (1,000 mg total) by mouth 2 (two) times daily.     levothyroxine 50 MCG tablet  Commonly known as:  SYNTHROID, LEVOTHROID  Take 1 tablet (50 mcg total) by mouth daily before breakfast.     potassium chloride SA 20 MEQ tablet  Commonly known as:  K-DUR,KLOR-CON  Take 1 tablet (20 mEq total) by mouth daily.     REFRESH OP  Place 1 drop into both eyes 3 (three) times daily as needed (dry eyes).        Discharge Assessment: Filed Vitals:   07/17/14 1417  BP: 108/71  Pulse: 82  Temp:   Resp: 20   Skin clean, dry and intact without evidence of skin break down, no evidence of skin tears noted. IV catheter discontinued intact. Site without signs and symptoms of complications - no redness or edema noted at insertion site, patient denies c/o pain - only slight tenderness at site.  Dressing with slight pressure applied.  D/c Instructions-Education: Discharge instructions given to patient/family with verbalized understanding. D/c education completed with patient/family including follow up instructions, medication list, d/c activities limitations if indicated, with other d/c instructions as indicated by MD - patient able to verbalize understanding, all questions fully answered. Patient instructed to return to ED, call 911, or call MD for any changes in condition.   Patient escorted via [place], and D/C home via private auto.  Dayle Points, RN 07/17/2014 3:19 PM

## 2014-07-17 NOTE — Discharge Summary (Signed)
Physician Discharge Summary  Susan Francis IWP:[id number] DOB: May 10, 1956 DOA: 07/12/2014  PCP: Horton Finer, MD  Admit date: 07/12/2014 Discharge date: 07/17/2014  Recommendations for Outpatient Follow-up:  1. Pt will need to follow up with PCP in 2 weeks post discharge 2. Please obtain BMP and LFTs  in one week  Discharge Diagnoses:  Status epilepticus - Seizure disorder  Status resolved - Neuro assisting w/ tx - meds being adjusted per Neuro - appears to be well controlled at this time  -neurology has weaned the patient off of the Vimpat -She will go home on Keppra 1000 mg twice a day -No further seizures  SIRS No evidence of sepsis - received only one day of abx - no signs of infection   Acute encephalopathy - ?hepatic encephalopathy  Initiated lactulose - ammonia now normalized - continue lactulose and follow  -the patient will continue on lactulose at home to have 2-3 bowel movements daily -Mentation is at baseline x 48 hours prior to d/c  History of polysubstance abuse (etoh, benzo, thc) admitted w/ EtOH abuse and benzo abuse Sept 2015 - pt admits to failing in her attempts to quit, but has now been 15 days w/o EtOH use - she states she has been benzo free for 30 days after a slow benzo taper directed by Psychiatry (UDS + benzo at admit likely due to that given to arrest sz) - she understands the absolute importance of abstinence in the face of Hep C cirrhosis, and that ongoing drinking will prevent her from being a tx candidate   Smoker, presumed COPD resp status currently stable   Tachycardia, sinus Reactive - resolved   Hypokalemia Replete further and follow - Mg is normal  -the patient is on chronic potassium supplementation at home -This is likely due to poor nutrition and chronic alcohol use  Hyponatremia Resolved - due to EtOH abuse and volume depletion  Hepatitis C - cirrhosis  Followed by Dr. Baxter Flattery at [facility] - has been undergoing eval  for possible Harvoni tx - was tx w/ interferon 12 week course remotely, but stopped due to poor response and side effects - [place] on 05/24/2014 noted cirrhotic architecture - to f/u at [facility] after d/c   Elevated LFTs in a pattern c/w EtOH, suspect due to cirrhosis - albumin is low at 2.7 - -slowly trending down -likely due to the patient's chronic hepatitis C with acute elevation due to her acute medical illness -need follow up in outpt setting  Thrombocytopenia - chronic Unclear etiology but suspect chronic etoh abuse + cirrhosis - plts stable presently  -remained stable without any evidence of bleeding  Newly diagnosed hypothyroidism On synthroid - will need f/u TSH in 8-12 weeks   Hyperglycemia No known hx of DM - check A1c -she had intermittent episodes of mild hyperglycemia -hemoglobin A1c pending at the time of discharge -Follow up with primary care provider to determine future need of hypoglycemic agents Deconditioning -Home health PT was recommended by physical therapy  Discharge Condition: stable  Disposition: home  Diet:low sodium Wt Readings from Last 3 Encounters:  07/17/14 70.2 kg (154 lb 12.2 oz)  05/25/14 65.772 kg (145 lb)  05/01/14 65.318 kg (144 lb)    History of present illness:  58 year old female with hx of seizures, hepatitis C, polysubstance abuse, and GERD who was noted by husband to have a seizure. After initial siezure she was post ictal for some time but began to normalize. Husband left the  house for some time and upon returning he was unable to enter the house as door was locked and wife was having another seizure. Once he gained entry the seizure had stopped and she had not returned to baseline. EMS was called. She seized again during EMS eval. She was brought to ED where she was intubated. PCCM admitted her to the ICU.  Significant Events: 11/10 Admitted with status epilepticus. Intubated in ED. Neuro consult > propofol, Kepra, Vimpat. PCCM  admission. High fever. Empiric antibiotics initiated.  11/10 CT head: No skull fracture or intracranial hemorrhage. 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 11/10 EEG: postictal, no epileptiform activity 11/11 extubated, started on precedex for agitation/combative 11/12 precedex discontinued - pt transferred to SDU     Consultants: Neurology--Dr. Doy Mince  Discharge Exam: Filed Vitals:   07/17/14 0538  BP: 124/82  Pulse: 79  Temp: 99.1 F (37.3 C)  Resp: 18   Filed Vitals:   07/16/14 2123 07/16/14 2132 07/17/14 0538 07/17/14 0809  BP: 117/71  124/82   Pulse: 82  79   Temp: 98.6 F (37 C)  99.1 F (37.3 C)   TempSrc: Oral  Oral   Resp: 18  18   Height:      Weight:   70.2 kg (154 lb 12.2 oz)   SpO2: 100% 99% 98% 99%   General: A&O x 3, NAD, pleasant, cooperative Cardiovascular: RRR, no rub, no gallop, no S3 Respiratory: CTAB, no wheeze, no rhonchi Abdomen:soft, nontender, nondistended, positive bowel sounds Extremities: No edema, No lymphangitis, no petechiae  Discharge Instructions     Medication List    TAKE these medications        citalopram 20 MG tablet  Commonly known as:  CELEXA  Take 1 tablet (20 mg total) by mouth daily.     lactulose 10 GM/15ML solution  Commonly known as:  CHRONULAC  Take 45 mLs (30 g total) by mouth 2 (two) times daily.     levETIRAcetam 1000 MG tablet  Commonly known as:  KEPPRA  Take 1 tablet (1,000 mg total) by mouth 2 (two) times daily.     levothyroxine 50 MCG tablet  Commonly known as:  SYNTHROID, LEVOTHROID  Take 1 tablet (50 mcg total) by mouth daily before breakfast.     potassium chloride SA 20 MEQ tablet  Commonly known as:  K-DUR,KLOR-CON  Take 1 tablet (20 mEq total) by mouth daily.     REFRESH OP  Place 1 drop into both eyes 3 (three) times daily as needed (dry eyes).         The results of  significant diagnostics from this hospitalization (including imaging, microbiology, ancillary and laboratory) are listed below for reference.    Significant Diagnostic Studies: Ct Head Wo Contrast  07/12/2014   CLINICAL DATA:  58 year old female with seizure this morning. History of alcoholism, HIV and GI bleed. Hepatitis-C. Initial encounter.  EXAM: CT HEAD WITHOUT CONTRAST  TECHNIQUE: Contiguous axial images were obtained from the base of the skull through the vertex without intravenous contrast.  COMPARISON:  06/28/2010 and 09/29/2006 CT. 11/10/2000 MR report. Films not available for review.  FINDINGS: No skull fracture or intracranial hemorrhage.  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  No CT evidence of large acute infarct.  Mild atrophy  without hydrocephalus.  Partial opacification left sphenoid sinus air cell.  Orbital structures unremarkable.  IMPRESSION: No skull fracture or intracranial hemorrhage.  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  No CT evidence of large acute infarct.  Mild atrophy without hydrocephalus.  Partial opacification left sphenoid sinus air cell.   Electronically Signed   By: Chauncey Cruel M.D.   On: 07/12/2014 11:44   Dg Chest Port 1 View  07/13/2014   CLINICAL DATA:  Respiratory failure.  EXAM: PORTABLE CHEST - 1 VIEW  COMPARISON:  Single view of the chest 07/12/2014.  FINDINGS: Endotracheal tube is in place with its tip 2.5 cm above the carina. NG tube courses into the stomach and below the inferior margin of the film. Lungs appear clear. Heart size is normal. No pneumothorax or pleural effusion.  IMPRESSION: Support apparatus projects in good position.  No acute disease.   Electronically Signed   By: Inge Rise M.D.   On: 07/13/2014 05:11   Dg Chest  Port 1 View  07/12/2014   CLINICAL DATA:  ET tube placement.  Seizure.  EXAM: PORTABLE CHEST - 1 VIEW  COMPARISON:  10/16/2006  FINDINGS: Endotracheal tube is 3 cm above the carina. NG tube is in the stomach. Heart is normal size. Mediastinal contours are within normal limits. No confluent opacities in the lungs. No effusions. No acute bony abnormality.  IMPRESSION: Endotracheal tube 3 cm above the carina.  No active disease.   Electronically Signed   By: Rolm Baptise M.D.   On: 07/12/2014 10:59     Microbiology: Recent Results (from the past 240 hour(s))  Culture, blood (routine x 2)     Status: None (Preliminary result)   Collection Time: 07/12/14 12:10 PM  Result Value Ref Range Status   Specimen Description BLOOD LEFT ANTECUBITAL  Final   Special Requests BOTTLES DRAWN AEROBIC AND ANAEROBIC 10MLS  Final   Culture  Setup Time   Final    07/12/2014 16:57 Performed at [facility]    Culture   Final           BLOOD CULTURE RECEIVED NO GROWTH TO DATE CULTURE WILL BE HELD FOR 5 DAYS BEFORE ISSUING A FINAL NEGATIVE REPORT Performed at [facility]    Report Status PENDING  Incomplete  Culture, blood (routine x 2)     Status: None (Preliminary result)   Collection Time: 07/12/14 12:20 PM  Result Value Ref Range Status   Specimen Description BLOOD LEFT HAND  Final   Special Requests BOTTLES DRAWN AEROBIC AND ANAEROBIC 10MLS  Final   Culture  Setup Time   Final    07/12/2014 16:56 Performed at [facility]    Culture   Final           BLOOD CULTURE RECEIVED NO GROWTH TO DATE CULTURE WILL BE HELD FOR 5 DAYS BEFORE ISSUING A FINAL NEGATIVE REPORT Performed at [facility]    Report Status PENDING  Incomplete  MRSA PCR Screening     Status: None   Collection Time: 07/12/14  1:59 PM  Result Value Ref Range Status   MRSA by PCR NEGATIVE NEGATIVE Final    Comment:        The GeneXpert MRSA Assay (FDA approved for NASAL specimens only), is one component  of a comprehensive MRSA colonization surveillance program. It is not intended to diagnose  MRSA infection nor to guide or monitor treatment for MRSA infections.   Culture, Urine     Status: None   Collection Time: 07/12/14  1:59 PM  Result Value Ref Range Status   Specimen Description URINE, CATHETERIZED  Final   Special Requests NONE  Final   Culture  Setup Time   Final    07/12/2014 22:15 Performed at [place] Performed at [facility]   Final   Culture NO GROWTH Performed at [facility]   Final   Report Status 07/13/2014 FINAL  Final     Labs: Basic Metabolic Panel:  Recent Labs Lab 07/13/14 1950 07/14/14 0221 07/15/14 0303 07/16/14 0324 07/17/14 0505  NA 144 141 142 144 142  K 3.7 3.7 3.5* 3.5* 4.2  CL 109 109 108 108 107  CO2 21 21 21 21 23   GLUCOSE 109* 134* 91 123* 90  BUN 6 6 8  3* 3*  CREATININE 0.85 0.84 0.93 0.83 0.86  CALCIUM 8.2* 8.0* 8.4 8.6 8.7  MG  --   --   --  1.9  --    Liver Function Tests:  Recent Labs Lab 07/12/14 1055 07/15/14 0303 07/16/14 0324 07/17/14 0505  AST 183* 134* 123* 110*  ALT 96* 67* 64* 64*  ALKPHOS 133* 88 91 90  BILITOT 1.5* 2.1* 2.4* 2.0*  PROT 7.0 5.7* 5.8* 6.0  ALBUMIN 3.5 2.8* 2.7* 3.0*   No results for input(s): LIPASE, AMYLASE in the last 168 hours.  Recent Labs Lab 07/12/14 1220 07/14/14 1215 07/16/14 0324  AMMONIA 57 80* 14   CBC:  Recent Labs Lab 07/12/14 1055 07/12/14 1405 07/13/14 0242 07/14/14 0221 07/16/14 0324 07/17/14 0500  WBC 9.4 7.2 8.1 3.9* 3.3* 3.4*  NEUTROABS 6.2  --   --   --   --   --   HGB 15.8* 15.0 13.8 12.4 13.4 13.6  HCT 47.0* 43.8 40.4 36.7 39.2 39.8  MCV 97.1 94.2 93.3 93.1 94.5 94.5  PLT 77* 55* 53* 43* 53* 61*   Cardiac Enzymes:  Recent Labs Lab 07/12/14 1220 07/13/14 0242 07/14/14 1035  TROPONINI 0.42* 0.85* <0.30   BNP: Invalid input(s): POCBNP CBG:  Recent Labs Lab 07/12/14 1434  GLUCAP  148*    Time coordinating discharge:  Greater than 30 minutes  Signed:  Briony Parveen, DO [facility] Pager: [phone] 07/17/2014, 11:09 AM

## 2014-07-17 NOTE — Progress Notes (Signed)
Subjective: No further seizures noted.  Tolerating taper of Vimpat.    Objective: Current vital signs: BP 124/82 mmHg  Pulse 79  Temp(Src) 99.1 F (37.3 C) (Oral)  Resp 18  Ht 5\' 4"  (1.626 m)  Wt 70.2 kg (154 lb 12.2 oz)  BMI 26.55 kg/m2  SpO2 99% Vital signs in last 24 hours: Temp:  [98.1 F (36.7 C)-99.1 F (37.3 C)] 99.1 F (37.3 C) (11/15 0538) Pulse Rate:  [79-86] 79 (11/15 0538) Resp:  [18-22] 18 (11/15 0538) BP: (117-124)/(71-82) 124/82 mmHg (11/15 0538) SpO2:  [96 %-100 %] 99 % (11/15 0809) Weight:  [70.2 kg (154 lb 12.2 oz)] 70.2 kg (154 lb 12.2 oz) (11/15 0538)  Intake/Output from previous day: 11/14 0701 - 11/15 0700 In: 290 [P.O.:240; I.V.:50] Out: 3 [Urine:2; Stool:1] Intake/Output this shift:   Nutritional status: Diet regular  Neurologic Exam: Mental Status: Alert, oriented, thought content appropriate. Speech fluent without evidence of aphasia. Able to follow 3 step commands without difficulty. Cranial Nerves: II: Discs flat bilaterally; Visual fields grossly normal, pupils equal, round, reactive to light and accommodation III,IV, VI: ptosis not present, extra-ocular motions intact bilaterally V,VII: smile symmetric, facial light touch sensation normal bilaterally VIII: hearing normal bilaterally IX,X: gag reflex present XI: bilateral shoulder shrug XII: midline tongue extension Motor: 5/5 with RUE limitation due to rotator cuff injury Sensory: Pinprick and light touch intact throughout, bilaterally Deep Tendon Reflexes: 2+ and symmetric throughout  Lab Results: Basic Metabolic Panel:  Recent Labs Lab 07/13/14 1950 07/14/14 0221 07/15/14 0303 07/16/14 0324 07/17/14 0505  NA 144 141 142 144 142  K 3.7 3.7 3.5* 3.5* 4.2  CL 109 109 108 108 107  CO2 21 21 21 21 23   GLUCOSE 109* 134* 91 123* 90  BUN 6 6 8  3* 3*  CREATININE 0.85 0.84 0.93 0.83 0.86  CALCIUM 8.2* 8.0* 8.4 8.6 8.7  MG  --   --   --  1.9  --     Liver Function  Tests:  Recent Labs Lab 07/12/14 1055 07/15/14 0303 07/16/14 0324 07/17/14 0505  AST 183* 134* 123* 110*  ALT 96* 67* 64* 64*  ALKPHOS 133* 88 91 90  BILITOT 1.5* 2.1* 2.4* 2.0*  PROT 7.0 5.7* 5.8* 6.0  ALBUMIN 3.5 2.8* 2.7* 3.0*   No results for input(s): LIPASE, AMYLASE in the last 168 hours.  Recent Labs Lab 07/12/14 1220 07/14/14 1215 07/16/14 0324  AMMONIA 57 80* 14    CBC:  Recent Labs Lab 07/12/14 1055 07/12/14 1405 07/13/14 0242 07/14/14 0221 07/16/14 0324 07/17/14 0500  WBC 9.4 7.2 8.1 3.9* 3.3* 3.4*  NEUTROABS 6.2  --   --   --   --   --   HGB 15.8* 15.0 13.8 12.4 13.4 13.6  HCT 47.0* 43.8 40.4 36.7 39.2 39.8  MCV 97.1 94.2 93.3 93.1 94.5 94.5  PLT 77* 55* 53* 43* 53* 61*    Cardiac Enzymes:  Recent Labs Lab 07/12/14 1220 07/13/14 0242 07/14/14 1035  TROPONINI 0.42* 0.85* <0.30    Lipid Panel:  Recent Labs Lab 07/12/14 1630  TRIG 178*    CBG:  Recent Labs Lab 07/12/14 1434  GLUCAP 148*    Microbiology: Results for orders placed or performed during the hospital encounter of 07/12/14  Culture, blood (routine x 2)     Status: None (Preliminary result)   Collection Time: 07/12/14 12:10 PM  Result Value Ref Range Status   Specimen Description BLOOD LEFT ANTECUBITAL  Final  Special Requests BOTTLES DRAWN AEROBIC AND ANAEROBIC 10MLS  Final   Culture  Setup Time   Final    07/12/2014 16:57 Performed at [facility]    Culture   Final           BLOOD CULTURE RECEIVED NO GROWTH TO DATE CULTURE WILL BE HELD FOR 5 DAYS BEFORE ISSUING A FINAL NEGATIVE REPORT Performed at [facility]    Report Status PENDING  Incomplete  Culture, blood (routine x 2)     Status: None (Preliminary result)   Collection Time: 07/12/14 12:20 PM  Result Value Ref Range Status   Specimen Description BLOOD LEFT HAND  Final   Special Requests BOTTLES DRAWN AEROBIC AND ANAEROBIC 10MLS  Final   Culture  Setup Time   Final    07/12/2014  16:56 Performed at [facility]    Culture   Final           BLOOD CULTURE RECEIVED NO GROWTH TO DATE CULTURE WILL BE HELD FOR 5 DAYS BEFORE ISSUING A FINAL NEGATIVE REPORT Performed at [facility]    Report Status PENDING  Incomplete  MRSA PCR Screening     Status: None   Collection Time: 07/12/14  1:59 PM  Result Value Ref Range Status   MRSA by PCR NEGATIVE NEGATIVE Final    Comment:        The GeneXpert MRSA Assay (FDA approved for NASAL specimens only), is one component of a comprehensive MRSA colonization surveillance program. It is not intended to diagnose MRSA infection nor to guide or monitor treatment for MRSA infections.   Culture, Urine     Status: None   Collection Time: 07/12/14  1:59 PM  Result Value Ref Range Status   Specimen Description URINE, CATHETERIZED  Final   Special Requests NONE  Final   Culture  Setup Time   Final    07/12/2014 22:15 Performed at [place] Performed at [facility]   Final   Culture NO GROWTH Performed at [facility]   Final   Report Status 07/13/2014 FINAL  Final    Coagulation Studies:  Recent Labs  07/17/14 0505  LABPROT 15.7*  INR 1.23    Imaging: No results found.  Medications:  I have reviewed the patient's current medications. Scheduled: . budesonide  0.25 mg Nebulization BID  . citalopram  20 mg Oral Daily  . folic acid  1 mg Oral Daily  . ipratropium-albuterol  3 mL Nebulization QID  . lacosamide  50 mg Oral BID  . lactulose  30 g Oral BID  . levETIRAcetam  1,000 mg Oral BID  . levothyroxine  50 mcg Oral QAC breakfast  . nicotine  14 mg Transdermal Daily  . potassium chloride  40 mEq Oral BID  . thiamine  100 mg Oral Daily    Assessment/Plan: Seizures controlled.  Patient stable on increase of Keppra.    Recommendations: 1.  Last dose of Vimpat today 2.  Continue Keppra at 1000mg  BID 3.  Patient unable to drive, operate  heavy machinery, perform activities at heights and participate in water activities until release by outpatient physician.  Patient to have outpatient neurology appointment.  Would consider Dr. Delice Lesch (epileptologist) with [facility].     LOS: 5 days   Alexis Goodell, MD [facility] [phone] 07/17/2014  8:30 AM

## 2014-07-18 ENCOUNTER — Other Ambulatory Visit (HOSPITAL_COMMUNITY): Payer: No Typology Code available for payment source

## 2014-07-18 LAB — CULTURE, BLOOD (ROUTINE X 2)
Culture: NO GROWTH
Culture: NO GROWTH

## 2014-07-20 ENCOUNTER — Other Ambulatory Visit (HOSPITAL_COMMUNITY): Payer: No Typology Code available for payment source

## 2014-07-22 ENCOUNTER — Other Ambulatory Visit (HOSPITAL_COMMUNITY): Payer: No Typology Code available for payment source

## 2014-07-25 ENCOUNTER — Other Ambulatory Visit (HOSPITAL_COMMUNITY): Payer: No Typology Code available for payment source

## 2014-07-27 ENCOUNTER — Other Ambulatory Visit (HOSPITAL_COMMUNITY): Payer: No Typology Code available for payment source

## 2014-07-29 ENCOUNTER — Other Ambulatory Visit (HOSPITAL_COMMUNITY): Payer: No Typology Code available for payment source

## 2014-08-01 ENCOUNTER — Other Ambulatory Visit (HOSPITAL_COMMUNITY): Payer: No Typology Code available for payment source

## 2014-08-03 ENCOUNTER — Other Ambulatory Visit (HOSPITAL_COMMUNITY): Payer: No Typology Code available for payment source

## 2014-08-04 ENCOUNTER — Ambulatory Visit: Payer: Self-pay | Admitting: Internal Medicine

## 2014-08-05 ENCOUNTER — Other Ambulatory Visit (HOSPITAL_COMMUNITY): Payer: No Typology Code available for payment source

## 2014-08-08 ENCOUNTER — Other Ambulatory Visit (HOSPITAL_COMMUNITY): Payer: No Typology Code available for payment source

## 2014-09-05 ENCOUNTER — Encounter (HOSPITAL_COMMUNITY): Payer: Self-pay | Admitting: Emergency Medicine

## 2014-09-05 ENCOUNTER — Emergency Department (HOSPITAL_COMMUNITY): Payer: Medicare Other

## 2014-09-05 ENCOUNTER — Emergency Department (HOSPITAL_COMMUNITY)
Admission: EM | Admit: 2014-09-05 | Discharge: 2014-09-06 | Disposition: A | Discharge status: Home / Self Care | Payer: Medicare Other | Attending: Emergency Medicine | Admitting: Emergency Medicine

## 2014-09-05 DIAGNOSIS — Z79899 Other long term (current) drug therapy: Secondary | ICD-10-CM | POA: Diagnosis not present

## 2014-09-05 DIAGNOSIS — F419 Anxiety disorder, unspecified: Secondary | ICD-10-CM | POA: Insufficient documentation

## 2014-09-05 DIAGNOSIS — Z720 Tobacco use: Secondary | ICD-10-CM | POA: Insufficient documentation

## 2014-09-05 DIAGNOSIS — W06XXXA Fall from bed, initial encounter: Secondary | ICD-10-CM | POA: Diagnosis not present

## 2014-09-05 DIAGNOSIS — Z8619 Personal history of other infectious and parasitic diseases: Secondary | ICD-10-CM | POA: Diagnosis not present

## 2014-09-05 DIAGNOSIS — Y998 Other external cause status: Secondary | ICD-10-CM | POA: Insufficient documentation

## 2014-09-05 DIAGNOSIS — W19XXXA Unspecified fall, initial encounter: Secondary | ICD-10-CM

## 2014-09-05 DIAGNOSIS — Z8719 Personal history of other diseases of the digestive system: Secondary | ICD-10-CM | POA: Insufficient documentation

## 2014-09-05 DIAGNOSIS — Y92092 Bedroom in other non-institutional residence as the place of occurrence of the external cause: Secondary | ICD-10-CM | POA: Insufficient documentation

## 2014-09-05 DIAGNOSIS — F909 Attention-deficit hyperactivity disorder, unspecified type: Secondary | ICD-10-CM | POA: Insufficient documentation

## 2014-09-05 DIAGNOSIS — F1210 Cannabis abuse, uncomplicated: Secondary | ICD-10-CM | POA: Insufficient documentation

## 2014-09-05 DIAGNOSIS — F10920 Alcohol use, unspecified with intoxication, uncomplicated: Secondary | ICD-10-CM

## 2014-09-05 DIAGNOSIS — F10120 Alcohol abuse with intoxication, uncomplicated: Secondary | ICD-10-CM | POA: Diagnosis not present

## 2014-09-05 DIAGNOSIS — G40909 Epilepsy, unspecified, not intractable, without status epilepticus: Secondary | ICD-10-CM | POA: Insufficient documentation

## 2014-09-05 DIAGNOSIS — Z043 Encounter for examination and observation following other accident: Secondary | ICD-10-CM | POA: Insufficient documentation

## 2014-09-05 DIAGNOSIS — Y9389 Activity, other specified: Secondary | ICD-10-CM | POA: Diagnosis not present

## 2014-09-05 LAB — COMPREHENSIVE METABOLIC PANEL
ALK PHOS: 115 U/L (ref 39–117)
ALT: 52 U/L — AB (ref 0–35)
AST: 123 U/L — ABNORMAL HIGH (ref 0–37)
Albumin: 3.6 g/dL (ref 3.5–5.2)
Anion gap: 6 (ref 5–15)
BUN: 6 mg/dL (ref 6–23)
CO2: 25 mmol/L (ref 19–32)
Calcium: 8.7 mg/dL (ref 8.4–10.5)
Chloride: 110 mEq/L (ref 96–112)
Creatinine, Ser: 0.68 mg/dL (ref 0.50–1.10)
GFR calc Af Amer: >90 mL/min (ref >90)
GFR calc non Af Amer: >90 mL/min (ref >90)
Glucose, Bld: 112 mg/dL — ABNORMAL HIGH (ref 70–99)
POTASSIUM: 3.7 mmol/L (ref 3.5–5.1)
SODIUM: 141 mmol/L (ref 135–145)
TOTAL PROTEIN: 6.1 g/dL (ref 6.0–8.3)
Total Bilirubin: 1.8 mg/dL — ABNORMAL HIGH (ref 0.3–1.2)

## 2014-09-05 LAB — CBC
HCT: 42.2 % (ref 36.0–46.0)
Hemoglobin: 14.4 g/dL (ref 12.0–15.0)
MCH: 32.2 pg (ref 26.0–34.0)
MCHC: 34.1 g/dL (ref 30.0–36.0)
MCV: 94.4 fL (ref 78.0–100.0)
Platelets: 49 10*3/uL — ABNORMAL LOW (ref 150–400)
RBC: 4.47 MIL/uL (ref 3.87–5.11)
RDW: 15.9 % — ABNORMAL HIGH (ref 11.5–15.5)
WBC: 6.1 10*3/uL (ref 4.0–10.5)

## 2014-09-05 LAB — RAPID URINE DRUG SCREEN, HOSP PERFORMED
Amphetamines: NOT DETECTED
Barbiturates: NOT DETECTED
Benzodiazepines: NOT DETECTED
Cocaine: NOT DETECTED
OPIATES: NOT DETECTED
Tetrahydrocannabinol: POSITIVE — AB

## 2014-09-05 LAB — SALICYLATE LEVEL: Salicylate Lvl: <4 mg/dL (ref 2.8–20.0)

## 2014-09-05 LAB — ACETAMINOPHEN LEVEL

## 2014-09-05 LAB — ETHANOL: ALCOHOL ETHYL (B): 338 mg/dL — AB (ref 0–9)

## 2014-09-05 MED ORDER — SODIUM CHLORIDE 0.9 % IV BOLUS (SEPSIS)
1000.0000 mL | Freq: Once | INTRAVENOUS | Status: AC
  Administered 2014-09-05: 1000 mL via INTRAVENOUS

## 2014-09-05 MED ORDER — LORAZEPAM 2 MG/ML IJ SOLN
1.0000 mg | Freq: Once | INTRAMUSCULAR | Status: AC
  Administered 2014-09-05: 1 mg via INTRAVENOUS
  Filled 2014-09-05: qty 1

## 2014-09-05 NOTE — Progress Notes (Signed)
CSW met with patient at bedside. Husband was at bedside. Patient appears to be intoxicated and is confused on why she is in the ED. Per husband, patient presents to the ED because she consumed 1 and a half pints of liquor.  Husband informed CSW that the patient rolled off of the bed. Also, according to husband the patient presented to the hospital a couple of months ago due to alcoholism.   Husband stated that if the patient were brought in 2 hours earlier she may have died. Patient confirms that she often has seizures. Patient states that she is not in pain, but feels anxious.  Husband/Michael Schnick [phone]   , LCSWA [phone] ED CSW 09/05/2014 8:50 PM   

## 2014-09-05 NOTE — Discharge Instructions (Signed)
Stop drinking. Follow up with therapist and outpatient alcohol problems provided. Return if worsening.    Alcohol Intoxication Alcohol intoxication occurs when the amount of alcohol that a person has consumed impairs his or her ability to mentally and physically function. Alcohol directly impairs the normal chemical activity of the brain. Drinking large amounts of alcohol can lead to changes in mental function and behavior, and it can cause many physical effects that can be harmful.  Alcohol intoxication can range in severity from mild to very severe. Various factors can affect the level of intoxication that occurs, such as the person's age, gender, weight, frequency of alcohol consumption, and the presence of other medical conditions (such as diabetes, seizures, or heart conditions). Dangerous levels of alcohol intoxication may occur when people drink large amounts of alcohol in a short period (binge drinking). Alcohol can also be especially dangerous when combined with certain prescription medicines or "recreational" drugs. SIGNS AND SYMPTOMS Some common signs and symptoms of mild alcohol intoxication include:  Loss of coordination.  Changes in mood and behavior.  Impaired judgment.  Slurred speech. As alcohol intoxication progresses to more severe levels, other signs and symptoms will appear. These may include:  Vomiting.  Confusion and impaired memory.  Slowed breathing.  Seizures.  Loss of consciousness. DIAGNOSIS  Your health care provider will take a medical history and perform a physical exam. You will be asked about the amount and type of alcohol you have consumed. Blood tests will be done to measure the concentration of alcohol in your blood. In many places, your blood alcohol level must be lower than 80 mg/dL (0.08%) to legally drive. However, many dangerous effects of alcohol can occur at much lower levels.  TREATMENT  People with alcohol intoxication often do not require  treatment. Most of the effects of alcohol intoxication are temporary, and they go away as the alcohol naturally leaves the body. Your health care provider will monitor your condition until you are stable enough to go home. Fluids are sometimes given through an IV access tube to help prevent dehydration.  HOME CARE INSTRUCTIONS  Do not drive after drinking alcohol.  Stay hydrated. Drink enough water and fluids to keep your urine clear or pale yellow. Avoid caffeine.   Only take over-the-counter or prescription medicines as directed by your health care provider.  SEEK MEDICAL CARE IF:   You have persistent vomiting.   You do not feel better after a few days.  You have frequent alcohol intoxication. Your health care provider can help determine if you should see a substance use treatment counselor. SEEK IMMEDIATE MEDICAL CARE IF:   You become shaky or tremble when you try to stop drinking.   You shake uncontrollably (seizure).   You throw up (vomit) blood. This may be bright red or may look like black coffee grounds.   You have blood in your stool. This may be bright red or may appear as a black, tarry, bad smelling stool.   You become lightheaded or faint.  MAKE SURE YOU:   Understand these instructions.  Will watch your condition.  Will get help right away if you are not doing well or get worse. Document Released: 05/29/2005 Document Revised: 04/21/2013 Document Reviewed: 01/22/2013 [facility] Patient Information 2015 [place], [place]. This information is not intended to replace advice given to you by your health care provider. Make sure you discuss any questions you have with your health care provider.
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
## 2014-09-05 NOTE — ED Notes (Signed)
Per EMS pt is from home and fell this evening on the carpet  Pt is intoxicated  Pt has no apparent injury  Pt drinks 2 pints a day  Pt has unsteady gait and speech is slurred  Pt has hx of seizure when she stops drinking  Pt takes keppra for her seizures

## 2014-09-13 ENCOUNTER — Ambulatory Visit (INDEPENDENT_AMBULATORY_CARE_PROVIDER_SITE_OTHER): Payer: Medicare Other | Admitting: Internal Medicine

## 2014-09-13 ENCOUNTER — Encounter: Payer: Self-pay | Admitting: Internal Medicine

## 2014-09-13 VITALS — BP 130/81 | HR 71 | Temp 97.8°F | Wt 152.0 lb

## 2014-09-13 DIAGNOSIS — B182 Chronic viral hepatitis C: Secondary | ICD-10-CM
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
## 2014-09-19 ENCOUNTER — Telehealth: Payer: Self-pay | Admitting: Pharmacist Clinician (PhC)/ Clinical Pharmacy Specialist

## 2014-09-19 ENCOUNTER — Other Ambulatory Visit: Payer: Self-pay | Admitting: Pharmacist Clinician (PhC)/ Clinical Pharmacy Specialist

## 2014-09-19 ENCOUNTER — Telehealth: Payer: Self-pay | Admitting: *Deleted

## 2014-09-19 ENCOUNTER — Other Ambulatory Visit: Payer: Self-pay | Admitting: *Deleted

## 2014-09-19 DIAGNOSIS — B1710 Acute hepatitis C without hepatic coma: Secondary | ICD-10-CM

## 2014-09-19 MED ORDER — LEDIPASVIR-SOFOSBUVIR 90-400 MG PO TABS: 1.0000 | ORAL_TABLET | Freq: Every day | ORAL | Status: DC

## 2014-09-19 MED ORDER — RIBAVIRIN 200 MG PO CAPS: ORAL_CAPSULE | ORAL | Status: DC

## 2014-09-19 MED ORDER — RIBAVIRIN 200 MG PO CAPS
ORAL_CAPSULE | ORAL | Status: DC
Start: 2014-09-19 — End: 2014-09-19

## 2014-09-19 NOTE — Telephone Encounter (Signed)
-----   Message from Lorne Skeens, RN sent at 09/14/2014  3:58 PM EST ----- Regarding: Harvoni approved [employer] approved Haroni, 09/13/14 to 02/28/15.  Questions to [phone]  Thank you.  Langley Gauss

## 2014-09-19 NOTE — Progress Notes (Signed)
Patient ID: [name], female   DOB: June 28, 1956, 59 y.o.   MRN: [id number] HPI: TATISHA CERINO is a 59 y.o. female who is here for her f/u Hep C  Lab Results  Component Value Date   HCVGENOTYPE 1b 02/24/2014    Allergies: Allergies  Allergen Reactions  . Adhesive [Tape] Hives and Other (See Comments)    Skin peels off (paper tape is ok)  . Oxycodone Hcl Hives  . Protonix [Pantoprazole Sodium] Itching    Maybe be brand related to the generic per patient    Vitals:    Past Medical History: Past Medical History  Diagnosis Date  . Stomach ulcer   . Nonspecific abnormal electrocardiogram (ECG) (EKG)   . Anxiety   . GERD (gastroesophageal reflux disease)   . Seizure     last seizure 4 yrs ago-no recent meds now  . Hepatitis C     dx. '03 -past hx. 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    Social History: History   Social History  . Marital Status: Married    Spouse Name: N/A    Number of Children: N/A  . Years of Education: N/A   Social History Main Topics  . Smoking status: Current Every Day Smoker -- 0.50 packs/day for 45 years  . Smokeless tobacco: None  . Alcohol Use: 9.0 oz/week    15 Shots of liquor per week     Comment: past ETOH abuse none in 5 yrs, stopped one week ago, going the [employer] and AA mtg  . Drug Use: 5.00 per week    Special: Marijuana, Benzodiazepines     Comment: past hx.IV Substance abuse- none in 25 yrs  . Sexual Activity: Not Currently   Other Topics Concern  . None   Social History Narrative    Labs: HEP B S AB (no units)  Date Value  02/24/2014 NEG   HEPATITIS B SURFACE AG (no units)  Date Value  02/24/2014 NEGATIVE   HCV AB (no units)  Date Value  06/11/2013 Reactive*    Lab Results  Component Value Date   HCVGENOTYPE 1b 02/24/2014    Hepatitis C RNA quantitative Latest Ref Rng 02/24/2014  HCV Quantitative <15 IU/mL 938069(H)  HCV Quantitative Log <1.18 log 10 5.97(H)    AST (U/L)  Date Value  09/05/2014 123*   07/17/2014 110*  07/16/2014 123*   ALT (U/L)  Date Value  09/05/2014 52*  07/17/2014 64*  07/16/2014 64*   INR (no units)  Date Value  07/17/2014 1.23  02/24/2014 1.20  06/11/2013 1.24    CrCl: Estimated Creatinine Clearance: 71.3 mL/min (by C-G formula based on Cr of 0.68).  Fibrosis Score: F4 as assessed by ARFI  Child-Pugh Score: Class A  Previous Treatment Regimen: Interferon/Riba  Assessment: Pt is here for her f/u with Hep C. She has been started on therapy in the past and couldn't tolerate interferon so it was stop. She has had issue with ETOH and has been attending [place] meetings. Based on ARFI, she has F4. I really stress the importance of staying off of ETOH. She has [place] and just got approved for Harvoni. We are also going to use it with ribavirin since she has exposure to treatment in the past. We are going to bring her back before starting therapy to stress the importance of compliance and discuss potential adverse effects.   Recommendations: Harvoni 1 PO qday x 3 months Ribavirin 400mg  qAM and 600mg  qPM x 39months F/u  with viral loads See next week for compliance  Devin Going, [place], [place].D., BCPS, AAHIVP Clinical Infectious [place] [facility] 09/19/2014, 10:54 AM

## 2014-09-19 NOTE — Telephone Encounter (Signed)
HEP C medications

## 2014-09-19 NOTE — Progress Notes (Unsigned)
HPI: Susan Francis is a 59 y.o. female who was here for f/u of her HIV  Lab Results  Component Value Date   HCVGENOTYPE 1b 02/24/2014    Allergies: Allergies  Allergen Reactions  . Adhesive [Tape] Hives and Other (See Comments)    Skin peels off (paper tape is ok)  . Oxycodone Hcl Hives  . Protonix [Pantoprazole Sodium] Itching    Maybe be brand related to the generic per patient    Vitals:    Past Medical History: Past Medical History  Diagnosis Date  . Stomach ulcer   . Nonspecific abnormal electrocardiogram (ECG) (EKG)   . Anxiety   . GERD (gastroesophageal reflux disease)   . Seizure     last seizure 4 yrs ago-no recent meds now  . Hepatitis C     dx. '03 -past hx. 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    Social History: History   Social History  . Marital Status: Married    Spouse Name: N/A    Number of Children: N/A  . Years of Education: N/A   Social History Main Topics  . Smoking status: Current Every Day Smoker -- 0.50 packs/day for 45 years  . Smokeless tobacco: Not on file  . Alcohol Use: 9.0 oz/week    15 Shots of liquor per week     Comment: past ETOH abuse none in 5 yrs, stopped one week ago, going the [employer] and AA mtg  . Drug Use: 5.00 per week    Special: Marijuana, Benzodiazepines     Comment: past hx.IV Substance abuse- none in 25 yrs  . Sexual Activity: Not Currently   Other Topics Concern  . Not on file   Social History Narrative    Labs: HEP B S AB (no units)  Date Value  02/24/2014 NEG   HEPATITIS B SURFACE AG (no units)  Date Value  02/24/2014 NEGATIVE   HCV AB (no units)  Date Value  06/11/2013 Reactive*    Lab Results  Component Value Date   HCVGENOTYPE 1b 02/24/2014    Hepatitis C RNA quantitative Latest Ref Rng 02/24/2014  HCV Quantitative <15 IU/mL 938069(H)  HCV Quantitative Log <1.18 log 10 5.97(H)    AST (U/L)  Date Value  09/05/2014 123*  07/17/2014 110*  07/16/2014 123*   ALT (U/L)  Date Value   09/05/2014 52*  07/17/2014 64*  07/16/2014 64*   INR (no units)  Date Value  07/17/2014 1.23  02/24/2014 1.20  06/11/2013 1.24    CrCl: Estimated Creatinine Clearance: 71.3 mL/min (by C-G formula based on Cr of 0.68).  Fibrosis Score: *** as assessed by ***   Child-Pugh Score: ***  Previous Treatment Regimen: ***  Assessment: ***  Recommendations: ***  Susan Francis [place], [place].D., BCPS, AAHIVP Clinical Infectious [place] [facility] 09/19/2014, 10:19 AM

## 2014-09-19 NOTE — Telephone Encounter (Signed)
I called the [employer] and got Susan Francis approved for the [employer] for [employer]. She is eligible through 09-19-15

## 2014-09-21 ENCOUNTER — Ambulatory Visit: Payer: Self-pay

## 2014-09-22 ENCOUNTER — Encounter: Payer: Self-pay | Admitting: Neurology

## 2014-09-22 ENCOUNTER — Ambulatory Visit: Payer: Medicare Other | Admitting: Pharmacist Clinician (PhC)/ Clinical Pharmacy Specialist

## 2014-09-22 ENCOUNTER — Ambulatory Visit (INDEPENDENT_AMBULATORY_CARE_PROVIDER_SITE_OTHER): Payer: Medicare Other | Admitting: Neurology

## 2014-09-22 ENCOUNTER — Other Ambulatory Visit: Payer: Self-pay | Admitting: *Deleted

## 2014-09-22 VITALS — BP 100/60 | HR 59 | Ht 62.5 in | Wt 151.0 lb

## 2014-09-22 DIAGNOSIS — B1710 Acute hepatitis C without hepatic coma: Secondary | ICD-10-CM

## 2014-09-22 DIAGNOSIS — R258 Other abnormal involuntary movements: Secondary | ICD-10-CM

## 2014-09-22 DIAGNOSIS — G40201 Localization-related (focal) (partial) symptomatic epilepsy and epileptic syndromes with complex partial seizures, not intractable, with status epilepticus: Secondary | ICD-10-CM

## 2014-09-22 DIAGNOSIS — E236 Other disorders of pituitary gland: Secondary | ICD-10-CM

## 2014-09-22 DIAGNOSIS — B182 Chronic viral hepatitis C: Secondary | ICD-10-CM

## 2014-09-22 NOTE — Patient Instructions (Signed)
1. Schedule MRI brain with and without contrast 2. Schedule MRI cervical spine with and without contrast for clonus 3. Schedule 24-hour EEG 4. Continue Keppra 1000mg  twice a day 5. As per [place] driving laws, one should not drive until 6 months seizure-free  Seizure Precautions: 1. If medication has been prescribed for you to prevent seizures, take it exactly as directed.  Do not stop taking the medicine without talking to your doctor first, even if you have not had a seizure in a long time.   2. Avoid activities in which a seizure would cause danger to yourself or to others.  Don't operate dangerous machinery, swim alone, or climb in high or dangerous places, such as on ladders, roofs, or girders.  Do not drive unless your doctor says you may.  3. If you have any warning that you may have a seizure, lay down in a safe place where you can't hurt yourself.    4.  No driving for 6 months from last seizure, as per [facility].   Please refer to the following link on the [place] website for more information: http://www.epilepsyfoundation.org/answerplace/Social/driving/drivingu.cfm   5.  Maintain good sleep hygiene.  6.  Contact your doctor if you have any problems that may be related to the medicine you are taking.  7.  Call 911 and bring the patient back to the ED if:        A.  The seizure lasts longer than 5 minutes.       B.  The patient doesn't awaken shortly after the seizure  C.  The patient has new problems such as difficulty seeing, speaking or moving  D.  The patient was injured during the seizure  E.  The patient has a temperature over 102 F (39C)  F.  The patient vomited and now is having trouble breathing

## 2014-09-22 NOTE — Progress Notes (Signed)
Patient ID: Susan Francis, female   DOB: Jan 09, 1956, 59 y.o.   MRN: [id number] HPI: Susan Francis is a 59 y.o. female is here for her hepatitis C visit  Lab Results  Component Value Date   HCVGENOTYPE 1b 02/24/2014    Allergies: Allergies  Allergen Reactions  . Adhesive [Tape] Hives and Other (See Comments)    Skin peels off (paper tape is ok)  . Oxycodone Hcl Hives  . Protonix [Pantoprazole Sodium] Itching    Maybe be brand related to the generic per patient    Vitals: BP: 100/60 mmHg (01/21 0755) Pulse Rate: 59 (01/21 0755)  Past Medical History: Past Medical History  Diagnosis Date  . Stomach ulcer   . Nonspecific abnormal electrocardiogram (ECG) (EKG)   . Anxiety   . GERD (gastroesophageal reflux disease)   . Seizure     last seizure 4 yrs ago-no recent meds now  . Hepatitis C     dx. '03 -past hx. 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  . Thyroid disease     Social History: History   Social History  . Marital Status: Married    Spouse Name: N/A    Number of Children: N/A  . Years of Education: N/A   Social History Main Topics  . Smoking status: Current Every Day Smoker -- 0.50 packs/day for 45 years  . Smokeless tobacco: Not on file  . Alcohol Use: 9.0 oz/week    15 Shots of liquor per week     Comment: past ETOH abuse none in 5 yrs, stopped one week ago, going the [employer] and AA mtg  . Drug Use: 5.00 per week    Special: Marijuana, Benzodiazepines     Comment: past hx.IV Substance abuse- none in 25 yrs  . Sexual Activity: Not Currently   Other Topics Concern  . Not on file   Social History Narrative    Labs: HEP B S AB (no units)  Date Value  02/24/2014 NEG   HEPATITIS B SURFACE AG (no units)  Date Value  02/24/2014 NEGATIVE   HCV AB (no units)  Date Value  06/11/2013 Reactive*    Lab Results  Component Value Date   HCVGENOTYPE 1b 02/24/2014    Hepatitis C RNA quantitative Latest Ref Rng 02/24/2014  HCV Quantitative <15 IU/mL 938069(H)   HCV Quantitative Log <1.18 log 10 5.97(H)    AST (U/L)  Date Value  09/05/2014 123*  07/17/2014 110*  07/16/2014 123*   ALT (U/L)  Date Value  09/05/2014 52*  07/17/2014 64*  07/16/2014 64*   INR (no units)  Date Value  07/17/2014 1.23  02/24/2014 1.20  06/11/2013 1.24    CrCl: Estimated Creatinine Clearance: 68.7 mL/min (by C-G formula based on Cr of 0.68).  Fibrosis Score: F4 as assessed by ARFI   Child-Pugh Score: Class A  Previous Treatment Regimen: Interferon/Riba  Assessment: She was previously treated with interferon and rib before it was stopped because she couldn't tolerated. She has a good hx of ETOH abuse. We are going to start her on Harvoni + Riba. She has been approved for it. She has not started on it and will start today. She doesn't have any major interactions on her meds profile. But after asking her about OTC meds, she does take Nexium on a PRN basis for reflux. I stressed it to her to avoid all PPIs, H2 blockers, and antacids if possible and to call the clinic back if she has issues. She agreed  that she will stay off of it. She will start today.   Recommendations: Start Harvoni 1 PO qday Start Ribavirin 400mg  qam and 600mg  qpm F/u with labs in 1 month  [name] [place], [place].D., BCPS, AAHIVP Clinical Infectious [place] [facility] 09/22/2014, 10:55 AM
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
## 2014-09-26 ENCOUNTER — Encounter: Payer: Self-pay | Admitting: Neurology

## 2014-09-26 NOTE — Progress Notes (Signed)
Done

## 2014-10-07 ENCOUNTER — Ambulatory Visit (HOSPITAL_COMMUNITY): Payer: Medicare Other

## 2014-10-07 ENCOUNTER — Ambulatory Visit (HOSPITAL_COMMUNITY)
Admission: RE | Admit: 2014-10-07 | Discharge: 2014-10-07 | Disposition: A | Discharge status: Home / Self Care | Payer: Medicare Other | Source: Ambulatory Visit | Attending: Neurology | Admitting: Neurology

## 2014-10-07 DIAGNOSIS — M4802 Spinal stenosis, cervical region: Secondary | ICD-10-CM | POA: Insufficient documentation

## 2014-10-07 DIAGNOSIS — R220 Localized swelling, mass and lump, head: Secondary | ICD-10-CM | POA: Insufficient documentation

## 2014-10-07 DIAGNOSIS — G40201 Localization-related (focal) (partial) symptomatic epilepsy and epileptic syndromes with complex partial seizures, not intractable, with status epilepticus: Secondary | ICD-10-CM | POA: Diagnosis present

## 2014-10-07 DIAGNOSIS — G319 Degenerative disease of nervous system, unspecified: Secondary | ICD-10-CM | POA: Diagnosis not present

## 2014-10-07 DIAGNOSIS — M5030 Other cervical disc degeneration, unspecified cervical region: Secondary | ICD-10-CM | POA: Diagnosis not present

## 2014-10-07 DIAGNOSIS — Z981 Arthrodesis status: Secondary | ICD-10-CM | POA: Diagnosis not present

## 2014-10-07 MED ORDER — GADOBENATE DIMEGLUMINE 529 MG/ML IV SOLN
15.0000 mL | Freq: Once | INTRAVENOUS | Status: AC | PRN
  Administered 2014-10-07: 15 mL via INTRAVENOUS

## 2014-10-24 ENCOUNTER — Other Ambulatory Visit: Payer: Self-pay

## 2014-10-24 ENCOUNTER — Ambulatory Visit: Payer: Self-pay | Admitting: Pharmacist Clinician (PhC)/ Clinical Pharmacy Specialist

## 2014-10-24 NOTE — Progress Notes (Signed)
Jenetta didn't know about the appt today. Called her to reschedule. She is doing well on harvoni/riba. Coming in on 10/26/14 to see me and get labs.   [name], PharmD Pager: [phone] 10/24/2014 11:41 AM

## 2014-10-26 ENCOUNTER — Ambulatory Visit: Payer: Medicare Other | Admitting: Pharmacist Clinician (PhC)/ Clinical Pharmacy Specialist

## 2014-10-26 ENCOUNTER — Other Ambulatory Visit: Payer: Medicare Other

## 2014-10-26 DIAGNOSIS — B1710 Acute hepatitis C without hepatic coma: Secondary | ICD-10-CM

## 2014-10-26 DIAGNOSIS — B182 Chronic viral hepatitis C: Secondary | ICD-10-CM

## 2014-10-26 LAB — BASIC METABOLIC PANEL
BUN: 6 mg/dL (ref 6–23)
CALCIUM: 8.4 mg/dL (ref 8.4–10.5)
CO2: 26 meq/L (ref 19–32)
CREATININE: 0.76 mg/dL (ref 0.50–1.10)
Chloride: 104 mEq/L (ref 96–112)
Glucose, Bld: 171 mg/dL — ABNORMAL HIGH (ref 70–99)
Potassium: 3.2 mEq/L — ABNORMAL LOW (ref 3.5–5.3)
Sodium: 140 mEq/L (ref 135–145)

## 2014-10-26 NOTE — Progress Notes (Signed)
Patient ID: Susan Francis, female   DOB: [birth year], 59 y.o.   MRN: [id number] HPI: Susan Francis is a 59 y.o. female who is here for her Hep C visit and labs  Lab Results  Component Value Date   HCVGENOTYPE 1b 02/24/2014    Allergies: Allergies  Allergen Reactions  . Adhesive [Tape] Hives and Other (See Comments)    Skin peels off (paper tape is ok)  . Oxycodone Hcl Hives  . Protonix [Pantoprazole Sodium] Itching    Maybe be brand related to the generic per patient    Vitals:    Past Medical History: Past Medical History  Diagnosis Date  . Stomach ulcer   . Nonspecific abnormal electrocardiogram (ECG) (EKG)   . Anxiety   . GERD (gastroesophageal reflux disease)   . Seizure     last seizure 4 yrs ago-no recent meds now  . Hepatitis C     dx. '03 -past hx. 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  . Thyroid disease     Social History: History   Social History  . Marital Status: Married    Spouse Name: N/A  . Number of Children: N/A  . Years of Education: N/A   Social History Main Topics  . Smoking status: Current Every Day Smoker -- 0.50 packs/day for 45 years  . Smokeless tobacco: Not on file  . Alcohol Use: 9.0 oz/week    15 Shots of liquor per week     Comment: past ETOH abuse none in 5 yrs, stopped one week ago, going the [employer] and AA mtg  . Drug Use: 5.00 per week    Special: Marijuana, Benzodiazepines     Comment: past hx.IV Substance abuse- none in 25 yrs  . Sexual Activity: Not Currently   Other Topics Concern  . Not on file   Social History Narrative    Labs: HEP B S AB (no units)  Date Value  02/24/2014 NEG   HEPATITIS B SURFACE AG (no units)  Date Value  02/24/2014 NEGATIVE   HCV AB (no units)  Date Value  06/11/2013 Reactive*    Lab Results  Component Value Date   HCVGENOTYPE 1b 02/24/2014    Hepatitis C RNA quantitative Latest Ref Rng 02/24/2014  HCV Quantitative <15 IU/mL 938069(H)  HCV Quantitative Log <1.18 log 10 5.97(H)     AST (U/L)  Date Value  09/05/2014 123*  07/17/2014 110*  07/16/2014 123*   ALT (U/L)  Date Value  09/05/2014 52*  07/17/2014 64*  07/16/2014 64*   INR (no units)  Date Value  07/17/2014 1.23  02/24/2014 1.20  06/11/2013 1.24    CrCl: CrCl cannot be calculated (Unknown ideal weight.).  Fibrosis Score: F4 as assessed by ARFI  Child-Pugh Score: Class A  Previous Treatment Regimen: 12 wks of interferon/riba  Assessment: 59 yo who is on harvoni/riba for here hep C. She said that her rash/itching is better since she started her treatment. She is tolerating the meds very well. She has not had any issue with the meds and has not missed any doses. We are going get labs today to assess her hep C viral load and CBC. She is on her 2 mo of the regimen now.   Recommendations:  Cont Harvoni/Riba to finish 3 months CBC and VL today  Susan Francis, [place].D., BCPS, AAHIVP Clinical Infectious [facility] 10/26/2014, 10:01 AM

## 2014-10-27 LAB — HEPATITIS C RNA QUANTITATIVE: HCV QUANT: NOT DETECTED [IU]/mL (ref <15)

## 2014-11-01 ENCOUNTER — Other Ambulatory Visit: Payer: Self-pay

## 2014-11-17 ENCOUNTER — Ambulatory Visit (INDEPENDENT_AMBULATORY_CARE_PROVIDER_SITE_OTHER): Payer: Medicare Other | Admitting: Internal Medicine

## 2014-11-17 VITALS — BP 115/75 | HR 65 | Temp 97.7°F | Wt 158.0 lb

## 2014-11-17 DIAGNOSIS — D696 Thrombocytopenia, unspecified: Secondary | ICD-10-CM | POA: Diagnosis not present

## 2014-11-17 DIAGNOSIS — B182 Chronic viral hepatitis C: Secondary | ICD-10-CM

## 2014-11-17 DIAGNOSIS — F1010 Alcohol abuse, uncomplicated: Secondary | ICD-10-CM

## 2014-11-17 DIAGNOSIS — F32A Depression, unspecified: Secondary | ICD-10-CM

## 2014-11-17 DIAGNOSIS — F329 Major depressive disorder, single episode, unspecified: Secondary | ICD-10-CM | POA: Diagnosis not present

## 2014-11-17 LAB — CBC WITH DIFFERENTIAL/PLATELET
Basophils Absolute: 0 10*3/uL (ref 0.0–0.1)
Basophils Relative: 0 % (ref 0–1)
EOS ABS: 0.2 10*3/uL (ref 0.0–0.7)
Eosinophils Relative: 7 % — ABNORMAL HIGH (ref 0–5)
HCT: 38.1 % (ref 36.0–46.0)
Hemoglobin: 12.8 g/dL (ref 12.0–15.0)
LYMPHS ABS: 1 10*3/uL (ref 0.7–4.0)
LYMPHS PCT: 30 % (ref 12–46)
MCH: 34 pg (ref 26.0–34.0)
MCHC: 33.6 g/dL (ref 30.0–36.0)
MCV: 101.3 fL — AB (ref 78.0–100.0)
MONO ABS: 0.3 10*3/uL (ref 0.1–1.0)
MPV: 10 fL (ref 8.6–12.4)
Monocytes Relative: 9 % (ref 3–12)
Neutro Abs: 1.8 10*3/uL (ref 1.7–7.7)
Neutrophils Relative %: 54 % (ref 43–77)
Platelets: 49 10*3/uL — ABNORMAL LOW (ref 150–400)
RBC: 3.76 MIL/uL — ABNORMAL LOW (ref 3.87–5.11)
RDW: 15.1 % (ref 11.5–15.5)
WBC: 3.4 10*3/uL — ABNORMAL LOW (ref 4.0–10.5)

## 2014-11-17 LAB — COMPLETE METABOLIC PANEL WITH GFR
ALK PHOS: 74 U/L (ref 39–117)
ALT: 16 U/L (ref 0–35)
AST: 34 U/L (ref 0–37)
Albumin: 3.7 g/dL (ref 3.5–5.2)
BUN: 9 mg/dL (ref 6–23)
CHLORIDE: 101 meq/L (ref 96–112)
CO2: 25 mEq/L (ref 19–32)
Calcium: 8.4 mg/dL (ref 8.4–10.5)
Creat: 0.67 mg/dL (ref 0.50–1.10)
GFR, Est African American: >89 mL/min
GFR, Est Non African American: >89 mL/min
GLUCOSE: 88 mg/dL (ref 70–99)
Potassium: 3.2 mEq/L — ABNORMAL LOW (ref 3.5–5.3)
Sodium: 137 mEq/L (ref 135–145)
Total Bilirubin: 3 mg/dL — ABNORMAL HIGH (ref 0.2–1.2)
Total Protein: 5.3 g/dL — ABNORMAL LOW (ref 6.0–8.3)
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
## 2014-11-18 ENCOUNTER — Telehealth: Payer: Self-pay | Admitting: Licensed Clinical Social Worker

## 2014-11-18 NOTE — Telephone Encounter (Signed)
[facility] called with a critical platelet lab of 49. Verified on smear

## 2014-11-30 ENCOUNTER — Other Ambulatory Visit: Payer: Self-pay

## 2014-12-13 ENCOUNTER — Ambulatory Visit: Payer: Self-pay | Admitting: Internal Medicine

## 2014-12-20 ENCOUNTER — Other Ambulatory Visit: Payer: Self-pay

## 2014-12-21 ENCOUNTER — Other Ambulatory Visit: Payer: Self-pay

## 2014-12-21 ENCOUNTER — Ambulatory Visit (INDEPENDENT_AMBULATORY_CARE_PROVIDER_SITE_OTHER): Payer: Medicare Other | Admitting: Neurology

## 2014-12-21 DIAGNOSIS — G40201 Localization-related (focal) (partial) symptomatic epilepsy and epileptic syndromes with complex partial seizures, not intractable, with status epilepticus: Secondary | ICD-10-CM

## 2014-12-26 ENCOUNTER — Ambulatory Visit (INDEPENDENT_AMBULATORY_CARE_PROVIDER_SITE_OTHER): Payer: Medicare Other | Admitting: Neurology

## 2014-12-26 ENCOUNTER — Encounter: Payer: Self-pay | Admitting: Neurology

## 2014-12-26 VITALS — BP 110/64 | HR 66 | Resp 16 | Ht 62.5 in | Wt 162.0 lb

## 2014-12-26 DIAGNOSIS — R258 Other abnormal involuntary movements: Secondary | ICD-10-CM | POA: Diagnosis not present

## 2014-12-26 DIAGNOSIS — G40201 Localization-related (focal) (partial) symptomatic epilepsy and epileptic syndromes with complex partial seizures, not intractable, with status epilepticus: Secondary | ICD-10-CM

## 2014-12-26 DIAGNOSIS — E236 Other disorders of pituitary gland: Secondary | ICD-10-CM | POA: Diagnosis not present
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
## 2015-01-04 ENCOUNTER — Ambulatory Visit (INDEPENDENT_AMBULATORY_CARE_PROVIDER_SITE_OTHER): Payer: Medicare Other | Admitting: Internal Medicine

## 2015-01-04 ENCOUNTER — Encounter: Payer: Self-pay | Admitting: Internal Medicine

## 2015-01-04 VITALS — BP 114/70 | HR 63 | Temp 98.1°F | Ht 62.5 in | Wt 165.0 lb

## 2015-01-04 DIAGNOSIS — Z23 Encounter for immunization: Secondary | ICD-10-CM

## 2015-01-04 DIAGNOSIS — B182 Chronic viral hepatitis C: Secondary | ICD-10-CM | POA: Diagnosis not present

## 2015-01-04 DIAGNOSIS — F1010 Alcohol abuse, uncomplicated: Secondary | ICD-10-CM

## 2015-01-04 NOTE — Progress Notes (Signed)
RFV:  Subjective:    Patient ID: Susan Francis, female    DOB: 02-05-1956, 59 y.o.   MRN: [id number]  HPI Susan Francis is a 59yo F with hx of HCV geno 1b, finished 3 month of harvoni in mid April 2016. Presents in follow up. Doing well with attending [place] meetings. She has not had any further binge drinking since the new year. She was initially fatigued from taking harvoni, but now improved  Current Outpatient Prescriptions on File Prior to Visit  Medication Sig Dispense Refill  . citalopram (CELEXA) 40 MG tablet 40 mg. Take 1 tablet daily  5  . lactulose (CHRONULAC) 10 GM/15ML solution Take 45 mLs (30 g total) by mouth 2 (two) times daily. 240 mL 0  . levETIRAcetam (KEPPRA) 1000 MG tablet Take 1 tablet (1,000 mg total) by mouth 2 (two) times daily. 60 tablet 0  . levothyroxine (SYNTHROID, LEVOTHROID) 50 MCG tablet Take 1 tablet (50 mcg total) by mouth daily before breakfast. 30 tablet 0  . Polyvinyl Alcohol-Povidone (REFRESH OP) Place 1 drop into both eyes 3 (three) times daily as needed (dry eyes).    . potassium chloride SA (K-DUR,KLOR-CON) 20 MEQ tablet Take 1 tablet (20 mEq total) by mouth daily. 30 tablet 0   No current facility-administered medications on file prior to visit.   Active Ambulatory Problems    Diagnosis Date Noted  . Seizure   . Hepatitis C   . GERD (gastroesophageal reflux disease) 06/11/2013  . Alcohol dependence 06/11/2013  . Uterine mass 06/11/2013  . Colitis 06/11/2013  . Prolonged Q-T interval on ECG 06/11/2013  . Troponin level elevated 06/11/2013  . Adnexal mass 06/12/2013  . Cholelithiasis 06/12/2013  . Elevated TSH 06/12/2013  . Nonspecific abnormal electrocardiogram (ECG) (EKG)   . Alcoholic ketoacidosis 38/75/6433  . Nausea & vomiting 05/01/2014  . Alcohol abuse 05/01/2014  . Hypokalemia 05/01/2014  . Hypocalcemia 05/01/2014  . Depression 05/01/2014  . Anxiety state 07/10/2014  . Continuous chronic alcoholism 07/10/2014  . Cramp in limb 01/05/2014  .  Abnormal antinuclear antibody titer 07/10/2014  . Disorder involving thrombocytopenia 07/10/2014  . Status epilepticus 07/12/2014  . Encounter for intubation   . Respiratory failure   . Encephalopathy acute   . Thyroid activity decreased   . Localization-related symptomatic epilepsy and epileptic syndromes with complex partial seizures, not intractable, with status epilepticus 12/26/2014  . 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 Clonus 12/26/2014   Resolved Ambulatory Problems    Diagnosis Date Noted  . Atypical chest pain 06/10/2013  . Noninfectious gastroenteritis and colitis 06/11/2013   Past Medical History  Diagnosis Date  . Stomach ulcer   . Anxiety   . Thyroid disease       Review of Systems + fatigue, 10 point ROS is otherwise negative    Objective:   Physical Exam BP 114/70 mmHg  Pulse 63  Temp(Src) 98.1 F (36.7 C) (Oral)  Ht 5' 2.5" (1.588 m)  Wt 165 lb (74.844 kg)  BMI 29.68 kg/m2 gen = a xo by 3 in nad HEENT = no scleral icterus, clear oral pharynx pulm = CTAB no w/c/r Cors = nl s1,s2 Skin = scattered spider angiomas       Assessment & Plan:  Health maintenance =will give hep A #1 today as well as hep B#3 vaccination  Chronic hepatitis c without hepatitic coma = will have her do svr 12 lab work on 7/17. Will need repeat ultrasound at next visit for [facility] surveillance  Alcohol  abuse = continue with  AA meetings

## 2015-03-13 ENCOUNTER — Other Ambulatory Visit: Payer: Medicare Other

## 2015-03-20 ENCOUNTER — Ambulatory Visit: Payer: Self-pay | Admitting: Internal Medicine

## 2015-05-02 ENCOUNTER — Encounter: Payer: Self-pay | Admitting: Internal Medicine

## 2015-05-02 ENCOUNTER — Ambulatory Visit (INDEPENDENT_AMBULATORY_CARE_PROVIDER_SITE_OTHER): Payer: Medicare Other | Admitting: Internal Medicine

## 2015-05-02 VITALS — BP 119/76 | HR 66 | Temp 97.2°F | Wt 155.0 lb

## 2015-05-02 DIAGNOSIS — F1010 Alcohol abuse, uncomplicated: Secondary | ICD-10-CM

## 2015-05-02 DIAGNOSIS — B1710 Acute hepatitis C without hepatic coma: Secondary | ICD-10-CM

## 2015-05-02 DIAGNOSIS — B182 Chronic viral hepatitis C: Secondary | ICD-10-CM

## 2015-05-02 DIAGNOSIS — Z23 Encounter for immunization: Secondary | ICD-10-CM | POA: Diagnosis not present

## 2015-05-02 LAB — COMPLETE METABOLIC PANEL WITH GFR
ALT: 18 U/L (ref 6–29)
AST: 31 U/L (ref 10–35)
Albumin: 3.9 g/dL (ref 3.6–5.1)
Alkaline Phosphatase: 96 U/L (ref 33–130)
BUN: 7 mg/dL (ref 7–25)
CALCIUM: 8.8 mg/dL (ref 8.6–10.4)
CHLORIDE: 106 mmol/L (ref 98–110)
CO2: 28 mmol/L (ref 20–31)
Creat: 0.77 mg/dL (ref 0.50–0.99)
GFR, Est African American: >89 mL/min (ref >=60)
GFR, Est Non African American: 84 mL/min (ref >=60)
GLUCOSE: 104 mg/dL — AB (ref 65–99)
Potassium: 3.9 mmol/L (ref 3.5–5.3)
SODIUM: 140 mmol/L (ref 135–146)
Total Bilirubin: 1.3 mg/dL — ABNORMAL HIGH (ref 0.2–1.2)
Total Protein: 5.6 g/dL — ABNORMAL LOW (ref 6.1–8.1)

## 2015-05-02 LAB — MAGNESIUM: Magnesium: 2 mg/dL (ref 1.5–2.5)

## 2015-05-02 NOTE — Progress Notes (Signed)
Rfv: follow up for chronic hepatitis c without hepatic coma Subjective:    Patient ID: Susan Francis, female    DOB: April 11, 1956, 59 y.o.   MRN: [id number]  HPI 59yo F with alcohol abuse hx, chronic hepatitis c without hepatic coma, finished harvoni in mid April. She was to see [place] in mid July for SVR 12 testing but she was unable to make the appointment. She continues to work with AA and her [place] sponsor 5 days a week. She states it is a great support system. Still has occasional drinking.   Ros: cramping of legs at night, had stopped taking potassium supplementaiton  Current Outpatient Prescriptions on File Prior to Visit  Medication Sig Dispense Refill  . citalopram (CELEXA) 40 MG tablet 40 mg. Take 1 tablet daily  5  . lactulose (CHRONULAC) 10 GM/15ML solution Take 45 mLs (30 g total) by mouth 2 (two) times daily. 240 mL 0  . levETIRAcetam (KEPPRA) 1000 MG tablet Take 1 tablet (1,000 mg total) by mouth 2 (two) times daily. 60 tablet 0  . Polyvinyl Alcohol-Povidone (REFRESH OP) Place 1 drop into both eyes 3 (three) times daily as needed (dry eyes).    Marland Kitchen levothyroxine (SYNTHROID, LEVOTHROID) 50 MCG tablet Take 1 tablet (50 mcg total) by mouth daily before breakfast. (Patient not taking: Reported on 05/02/2015) 30 tablet 0  . potassium chloride SA (K-DUR,KLOR-CON) 20 MEQ tablet Take 1 tablet (20 mEq total) by mouth daily. (Patient not taking: Reported on 05/02/2015) 30 tablet 0   No current facility-administered medications on file prior to visit.   Active Ambulatory Problems    Diagnosis Date Noted  . Seizure   . Hepatitis C   . GERD (gastroesophageal reflux disease) 06/11/2013  . Alcohol dependence 06/11/2013  . Uterine mass 06/11/2013  . Colitis 06/11/2013  . Prolonged Q-T interval on ECG 06/11/2013  . Troponin level elevated 06/11/2013  . Adnexal mass 06/12/2013  . Cholelithiasis 06/12/2013  . Elevated TSH 06/12/2013  . Nonspecific abnormal electrocardiogram (ECG) (EKG)   . Alcoholic  ketoacidosis 22/29/7989  . Nausea & vomiting 05/01/2014  . Alcohol abuse 05/01/2014  . Hypokalemia 05/01/2014  . Hypocalcemia 05/01/2014  . Depression 05/01/2014  . Anxiety state 07/10/2014  . Continuous chronic alcoholism 07/10/2014  . Cramp in limb 01/05/2014  . Abnormal antinuclear antibody titer 07/10/2014  . Disorder involving thrombocytopenia 07/10/2014  . Status epilepticus 07/12/2014  . Encounter for intubation   . Respiratory failure   . Encephalopathy acute   . Thyroid activity decreased   . Localization-related symptomatic epilepsy and epileptic syndromes with complex partial seizures, not intractable, with status epilepticus 12/26/2014  . 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 Clonus 12/26/2014   Resolved Ambulatory Problems    Diagnosis Date Noted  . Atypical chest pain 06/10/2013  . Noninfectious gastroenteritis and colitis 06/11/2013   Past Medical History  Diagnosis Date  . Stomach ulcer   . Anxiety   . Thyroid disease       Review of Systems Other than what is mentioned in hpi, 10 point ros is negative    Objective:   Physical Exam BP 119/76 mmHg  Pulse 66  Temp(Src) 97.2 F (36.2 C) (Oral)  Wt 155 lb (70.308 kg) Physical Exam  Constitutional:  oriented to person, place, and time. appears well-developed and well-nourished. No distress.  HENT: [place]/AT, PERRLA, no scleral icterus Mouth/Throat: Oropharynx is clear and moist. No oropharyngeal exudate.  Cardiovascular: Normal rate, regular rhythm and normal heart sounds. Exam reveals no  gallop and no friction rub.  No murmur heard.  Pulmonary/Chest: Effort normal and breath sounds normal. No respiratory distress.  has no wheezes.  Neck = supple, no nuchal rigidity Abdominal: Soft. Bowel sounds are normal.  exhibits no distension. There is no tenderness.  Lymphadenopathy: no cervical adenopathy. No axillary adenopathy Neurological: alert and oriented to person, place, and time.  Skin: Skin is warm and dry.  Spider angiomata on chest Psychiatric: a normal mood and affect.  behavior is normal.         Assessment & Plan:  Chronic hepatitis c without hepatic coma = will check late -SVR 12 levels. In addition will need screening for [facility] with repeat U/S abd  Alcohol use = continue to work with sponsor and [place] meetings  Health maintenance = will give flu vaccination today; otherwise has hep A #2 in november

## 2015-05-03 LAB — HEPATITIS C RNA QUANTITATIVE: HCV Quantitative: NOT DETECTED IU/mL (ref <15)

## 2015-05-13 ENCOUNTER — Emergency Department (HOSPITAL_COMMUNITY)
Admission: EM | Admit: 2015-05-13 | Discharge: 2015-05-14 | Disposition: A | Discharge status: Home / Self Care | Payer: Medicare Other | Attending: Emergency Medicine | Admitting: Emergency Medicine

## 2015-05-13 ENCOUNTER — Encounter (HOSPITAL_COMMUNITY): Payer: Self-pay | Admitting: *Deleted

## 2015-05-13 DIAGNOSIS — R000 Tachycardia, unspecified: Secondary | ICD-10-CM | POA: Diagnosis not present

## 2015-05-13 DIAGNOSIS — R569 Unspecified convulsions: Secondary | ICD-10-CM

## 2015-05-13 DIAGNOSIS — F10230 Alcohol dependence with withdrawal, uncomplicated: Secondary | ICD-10-CM

## 2015-05-13 DIAGNOSIS — F10930 Alcohol use, unspecified with withdrawal, uncomplicated: Secondary | ICD-10-CM

## 2015-05-13 DIAGNOSIS — Z8619 Personal history of other infectious and parasitic diseases: Secondary | ICD-10-CM | POA: Diagnosis not present

## 2015-05-13 DIAGNOSIS — E079 Disorder of thyroid, unspecified: Secondary | ICD-10-CM | POA: Diagnosis not present

## 2015-05-13 DIAGNOSIS — F419 Anxiety disorder, unspecified: Secondary | ICD-10-CM | POA: Diagnosis not present

## 2015-05-13 DIAGNOSIS — G40909 Epilepsy, unspecified, not intractable, without status epilepticus: Secondary | ICD-10-CM | POA: Diagnosis not present

## 2015-05-13 DIAGNOSIS — K219 Gastro-esophageal reflux disease without esophagitis: Secondary | ICD-10-CM | POA: Diagnosis not present

## 2015-05-13 DIAGNOSIS — Z79899 Other long term (current) drug therapy: Secondary | ICD-10-CM | POA: Diagnosis not present

## 2015-05-13 DIAGNOSIS — Z720 Tobacco use: Secondary | ICD-10-CM | POA: Diagnosis not present

## 2015-05-13 MED ORDER — LORAZEPAM 2 MG/ML IJ SOLN
1.0000 mg | Freq: Once | INTRAMUSCULAR | Status: AC
  Administered 2015-05-13: 1 mg via INTRAVENOUS

## 2015-05-13 MED ORDER — LORAZEPAM 2 MG/ML IJ SOLN
INTRAMUSCULAR | Status: DC
Start: 2015-05-13 — End: 2015-05-14
  Filled 2015-05-13: qty 1
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
## 2015-05-13 NOTE — ED Notes (Addendum)
Pt had seizure, 56minutes, postictal, will respond to voice, 1mg  ativan ordered, pt stable and family at bedside.
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
## 2015-05-14 LAB — CBC WITH DIFFERENTIAL/PLATELET
Basophils Absolute: 0 10*3/uL (ref 0.0–0.1)
Basophils Relative: 0 % (ref 0–1)
EOS ABS: 0.1 10*3/uL (ref 0.0–0.7)
EOS PCT: 2 % (ref 0–5)
HCT: 44 % (ref 36.0–46.0)
Hemoglobin: 15.7 g/dL — ABNORMAL HIGH (ref 12.0–15.0)
LYMPHS ABS: 0.9 10*3/uL (ref 0.7–4.0)
Lymphocytes Relative: 22 % (ref 12–46)
MCH: 32.6 pg (ref 26.0–34.0)
MCHC: 35.7 g/dL (ref 30.0–36.0)
MCV: 91.3 fL (ref 78.0–100.0)
MONOS PCT: 5 % (ref 3–12)
Monocytes Absolute: 0.2 10*3/uL (ref 0.1–1.0)
Neutro Abs: 3 10*3/uL (ref 1.7–7.7)
Neutrophils Relative %: 71 % (ref 43–77)
Platelets: 40 10*3/uL — ABNORMAL LOW (ref 150–400)
RBC: 4.82 MIL/uL (ref 3.87–5.11)
RDW: 14 % (ref 11.5–15.5)
WBC: 4.2 10*3/uL (ref 4.0–10.5)

## 2015-05-14 LAB — URINALYSIS, ROUTINE W REFLEX MICROSCOPIC
Bilirubin Urine: NEGATIVE
Glucose, UA: NEGATIVE mg/dL
Hgb urine dipstick: NEGATIVE
Ketones, ur: NEGATIVE mg/dL
LEUKOCYTES UA: NEGATIVE
NITRITE: NEGATIVE
Protein, ur: NEGATIVE mg/dL
SPECIFIC GRAVITY, URINE: 1.004 — AB (ref 1.005–1.030)
UROBILINOGEN UA: 0.2 mg/dL (ref 0.0–1.0)
pH: 8 (ref 5.0–8.0)

## 2015-05-14 LAB — COMPREHENSIVE METABOLIC PANEL
ALT: 22 U/L (ref 14–54)
ANION GAP: 11 (ref 5–15)
AST: 47 U/L — ABNORMAL HIGH (ref 15–41)
Albumin: 3.7 g/dL (ref 3.5–5.0)
Alkaline Phosphatase: 94 U/L (ref 38–126)
BUN: 13 mg/dL (ref 6–20)
CO2: 22 mmol/L (ref 22–32)
Calcium: 9.2 mg/dL (ref 8.9–10.3)
Chloride: 104 mmol/L (ref 101–111)
Creatinine, Ser: 0.86 mg/dL (ref 0.44–1.00)
GFR calc non Af Amer: >60 mL/min (ref >60)
Glucose, Bld: 162 mg/dL — ABNORMAL HIGH (ref 65–99)
POTASSIUM: 3.6 mmol/L (ref 3.5–5.1)
SODIUM: 137 mmol/L (ref 135–145)
Total Bilirubin: 2.2 mg/dL — ABNORMAL HIGH (ref 0.3–1.2)
Total Protein: 5.7 g/dL — ABNORMAL LOW (ref 6.5–8.1)

## 2015-05-14 MED ORDER — SODIUM CHLORIDE 0.9 % IV SOLN
Freq: Once | INTRAVENOUS | Status: AC
  Administered 2015-05-14: 02:00:00 via INTRAVENOUS

## 2015-05-14 MED ORDER — LORAZEPAM 1 MG PO TABS: 1.0000 mg | ORAL_TABLET | Freq: Three times a day (TID) | ORAL | Status: DC | PRN

## 2015-05-14 NOTE — ED Notes (Signed)
Pt sats 89% on room air; placed on 2L [place]; sats improving

## 2015-05-14 NOTE — Progress Notes (Signed)
   05/14/15 0056  Clinical Encounter Type  Visited With Health care provider  Visit Type Code  Referral From Nurse  Chaplain responded to code; Chaplain did not find any family readily available; Chaplain can be called for patient upon request

## 2015-05-14 NOTE — Discharge Instructions (Signed)
Please see the Neurologist soon. If your primary is managing the seizures, see them. Make sure someone is checking on you.  Ativan is given for alcohol withdrawal symptoms. Return to the ER if there is a repeat seizure.  Alcohol Withdrawal Anytime drug use is interfering with normal living activities it has become abuse. This includes problems with family and friends. Psychological dependence has developed when your mind tells you that the drug is needed. This is usually followed by physical dependence when a continuing increase of drugs are required to get the same feeling or "high." This is known as addiction or chemical dependency. A person's risk is much higher if there is a history of chemical dependency in the family. Mild Withdrawal Following Stopping Alcohol, When Addiction or Chemical Dependency Has Developed When a person has developed tolerance to alcohol, any sudden stopping of alcohol can cause uncomfortable physical symptoms. Most of the time these are mild and consist of tremors in the hands and increases in heart rate, breathing, and temperature. Sometimes these symptoms are associated with anxiety, panic attacks, and bad dreams. There may also be stomach upset. Normal sleep patterns are often interrupted with periods of inability to sleep (insomnia). This may last for 6 months. Because of this discomfort, many people choose to continue drinking to get rid of this discomfort and to try to feel normal. Severe Withdrawal with Decreased or No Alcohol Intake, When Addiction or Chemical Dependency Has Developed About five percent of alcoholics will develop signs of severe withdrawal when they stop using alcohol. One sign of this is development of generalized seizures (convulsions). Other signs of this are severe agitation and confusion. This may be associated with believing in things which are not real or seeing things which are not really there (delusions and hallucinations). Vitamin  deficiencies are usually present if alcohol intake has been long-term. Treatment for this most often requires hospitalization and close observation. Addiction can only be helped by stopping use of all chemicals. This is hard but may save your life. With continual alcohol use, possible outcomes are usually loss of self respect and esteem, violence, and death. Addiction cannot be cured but it can be stopped. This often requires outside help and the care of professionals. Treatment centers are listed in the yellow pages under Cocaine, Narcotics, and Alcoholics Anonymous. Most hospitals and clinics can refer you to a specialized care center. It is not necessary for you to go through the uncomfortable symptoms of withdrawal. Your caregiver can provide you with medicines that will help you through this difficult period. Try to avoid situations, friends, or drugs that made it possible for you to keep using alcohol in the past. Learn how to say no. It takes a long period of time to overcome addictions to all drugs, including alcohol. There may be many times when you feel as though you want a drink. After getting rid of the physical addiction and withdrawal, you will have a lessening of the craving which tells you that you need alcohol to feel normal. Call your caregiver if more support is needed. Learn who to talk to in your family and among your friends so that during these periods you can receive outside help. Alcoholics Anonymous (AA) has helped many people over the years. To get further help, contact AA or call your caregiver, counselor, or clergyperson. Al-Anon and Alateen are support groups for friends and family members of an alcoholic. The people who love and care for an alcoholic often need help, too.  For information about these organizations, check your phone directory or call a local alcoholism treatment center.  SEEK IMMEDIATE MEDICAL CARE IF:   You have a seizure.  You have a fever.  You experience  uncontrolled vomiting or you vomit up blood. This may be bright red or look like black coffee grounds.  You have blood in the stool. This may be bright red or appear as a black, tarry, bad-smelling stool.  You become lightheaded or faint. Do not drive if you feel this way. Have someone else drive you or call 425 for help.  You become more agitated or confused.  You develop uncontrolled anxiety.  You begin to see things that are not really there (hallucinate). Your caregiver has determined that you completely understand your medical condition, and that your mental state is back to normal. You understand that you have been treated for alcohol withdrawal, have agreed not to drink any alcohol for a minimum of 1 day, will not operate a car or other machinery for 24 hours, and have had an opportunity to ask any questions about your condition. Document Released: 05/29/2005 Document Revised: 11/11/2011 Document Reviewed: 04/06/2008 [facility] Patient Information 2015 [place], [place]. This information is not intended to replace advice given to you by your health care provider. Make sure you discuss any questions you have with your health care provider. Seizure, Adult A seizure is abnormal electrical activity in the brain. Seizures usually last from 30 seconds to 2 minutes. There are various types of seizures. Before a seizure, you may have a warning sensation (aura) that a seizure is about to occur. An aura may include the following symptoms:   Fear or anxiety.  Nausea.  Feeling like the room is spinning (vertigo).  Vision changes, such as seeing flashing lights or spots. Common symptoms during a seizure include:  A change in attention or behavior (altered mental status).  Convulsions with rhythmic jerking movements.  Drooling.  Rapid eye movements.  Grunting.  Loss of bladder and bowel control.  Bitter taste in the mouth.  Tongue biting. After a seizure, you may feel confused and  sleepy. You may also have an injury resulting from convulsions during the seizure. HOME CARE INSTRUCTIONS   If you are given medicines, take them exactly as prescribed by your health care provider.  Keep all follow-up appointments as directed by your health care provider.  Do not swim or drive or engage in risky activity during which a seizure could cause further injury to you or others until your health care provider says it is OK.  Get adequate rest.  Teach friends and family what to do if you have a seizure. They should:  Lay you on the ground to prevent a fall.  Put a cushion under your head.  Loosen any tight clothing around your neck.  Turn you on your side. If vomiting occurs, this helps keep your airway clear.  Stay with you until you recover.  Know whether or not you need emergency care. SEEK IMMEDIATE MEDICAL CARE IF:  The seizure lasts longer than 5 minutes.  The seizure is severe or you do not wake up immediately after the seizure.  You have an altered mental status after the seizure.  You are having more frequent or worsening seizures. Someone should drive you to the emergency department or call local emergency services (911 in [place] MAKE SURE YOU:  Understand these instructions.  Will watch your condition.  Will get help right away if you are not  doing well or get worse. Document Released: 08/16/2000 Document Revised: 06/09/2013 Document Reviewed: 03/31/2013 [facility] Patient Information 2015 [place], [place]. This information is not intended to replace advice given to you by your health care provider. Make sure you discuss any questions you have with your health care provider.

## 2015-06-27 ENCOUNTER — Ambulatory Visit (INDEPENDENT_AMBULATORY_CARE_PROVIDER_SITE_OTHER): Payer: Medicare Other | Admitting: Neurology

## 2015-06-27 ENCOUNTER — Encounter: Payer: Self-pay | Admitting: Neurology

## 2015-06-27 VITALS — BP 90/70 | HR 70 | Ht 62.5 in | Wt 153.0 lb

## 2015-06-27 DIAGNOSIS — G40201 Localization-related (focal) (partial) symptomatic epilepsy and epileptic syndromes with complex partial seizures, not intractable, with status epilepticus: Secondary | ICD-10-CM | POA: Diagnosis not present

## 2015-06-27 DIAGNOSIS — F32A Depression, unspecified: Secondary | ICD-10-CM

## 2015-06-27 DIAGNOSIS — F329 Major depressive disorder, single episode, unspecified: Secondary | ICD-10-CM

## 2015-06-27 MED ORDER — LEVETIRACETAM 1000 MG PO TABS
1000.0000 mg | ORAL_TABLET | Freq: Two times a day (BID) | ORAL | Status: DC
Start: 2015-06-27 — End: 2016-03-20

## 2015-06-27 NOTE — Patient Instructions (Signed)
1. Continue Keppra 1000mg  twice a day 2. Refer to Behavioral Medicine for depression 3. Follow-up in 6 months, call for any problems
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
## 2015-07-10 ENCOUNTER — Ambulatory Visit (INDEPENDENT_AMBULATORY_CARE_PROVIDER_SITE_OTHER): Payer: Medicare Other | Admitting: *Deleted

## 2015-07-10 DIAGNOSIS — Z23 Encounter for immunization: Secondary | ICD-10-CM | POA: Diagnosis not present

## 2015-07-20 ENCOUNTER — Ambulatory Visit (INDEPENDENT_AMBULATORY_CARE_PROVIDER_SITE_OTHER): Payer: Medicare Other | Admitting: Internal Medicine

## 2015-07-20 ENCOUNTER — Encounter: Payer: Self-pay | Admitting: Internal Medicine

## 2015-07-20 VITALS — BP 96/61 | HR 69 | Temp 98.2°F | Wt 150.0 lb

## 2015-07-20 DIAGNOSIS — Z23 Encounter for immunization: Secondary | ICD-10-CM

## 2015-07-20 DIAGNOSIS — B182 Chronic viral hepatitis C: Secondary | ICD-10-CM

## 2015-07-20 DIAGNOSIS — K7460 Unspecified cirrhosis of liver: Secondary | ICD-10-CM | POA: Diagnosis not present

## 2015-07-20 NOTE — Progress Notes (Signed)
RFV: follow up for hep c treatment Subjective:    Patient ID: Susan Francis, female    DOB: 12-18-1955, 59 y.o.   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 She has finished 12 week treatment with harvoni with SVR 12 remaining undectable viral load. She continues to attend daily AA meetings and maintains 4 month sobriety.   Allergies  Allergen Reactions  . Adhesive [Tape] Hives and Other (See Comments)    Skin peels off (paper tape is ok)  . Oxycodone Hcl Hives  . Protonix [Pantoprazole Sodium] Itching    Maybe be brand related to the generic per patient   Current Outpatient Prescriptions on File Prior to Visit  Medication Sig Dispense Refill  . ibuprofen (ADVIL,MOTRIN) 200 MG tablet Take 400 mg by mouth every 6 (six) hours as needed for headache.    . levETIRAcetam (KEPPRA) 1000 MG tablet Take 1 tablet (1,000 mg total) by mouth 2 (two) times daily. 60 tablet 11  . potassium chloride SA (K-DUR,KLOR-CON) 20 MEQ tablet Take 1 tablet (20 mEq total) by mouth daily. 30 tablet 0   No current facility-administered medications on file prior to visit.   Active Ambulatory Problems    Diagnosis Date Noted  . Seizure ([place])   . Hepatitis C   . GERD (gastroesophageal reflux disease) 06/11/2013  . Alcohol dependence ([place]) 06/11/2013  . Uterine mass 06/11/2013  . Colitis 06/11/2013  . Prolonged Q-T interval on ECG 06/11/2013  . Troponin level elevated 06/11/2013  . Adnexal mass 06/12/2013  . Cholelithiasis 06/12/2013  . Elevated TSH 06/12/2013  . Nonspecific abnormal electrocardiogram (ECG) (EKG)   . Alcoholic ketoacidosis 0000000  . Nausea & vomiting 05/01/2014  . Alcohol abuse 05/01/2014  . Hypokalemia 05/01/2014  . Hypocalcemia 05/01/2014  . Depression 05/01/2014  . Anxiety state 07/10/2014  . Continuous chronic alcoholism ([place]) 07/10/2014  . Cramp in limb 01/05/2014  . Abnormal antinuclear antibody titer 07/10/2014  . Disorder involving  thrombocytopenia ([place]) 07/10/2014  . Status epilepticus ([place]) 07/12/2014  . Encounter for intubation   . Respiratory failure ([place])   . Encephalopathy acute   . Thyroid activity decreased   . Localization-related symptomatic epilepsy and epileptic syndromes with complex partial seizures, not intractable, with status epilepticus ([place]) 12/26/2014  . 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 Clonus 12/26/2014   Resolved Ambulatory Problems    Diagnosis Date Noted  . Atypical chest pain 06/10/2013  . Noninfectious gastroenteritis and colitis 06/11/2013   Past Medical History  Diagnosis Date  . Stomach ulcer   . Anxiety   . Thyroid disease    family history includes Alcohol abuse in her brother and father; Drug abuse in her brother; Heart disease in her father; Pulmonary fibrosis in her mother. Review of Systems  Constitutional: Negative for fever, chills, diaphoresis, activity change, appetite change, fatigue and unexpected weight change.  HENT: Negative for congestion, sore throat, rhinorrhea, sneezing, trouble swallowing and sinus pressure.  Eyes: Negative for photophobia and visual disturbance.  Respiratory: Negative for cough, chest tightness, shortness of breath, wheezing and stridor.  Cardiovascular: Negative for chest pain, palpitations and leg swelling.  Gastrointestinal: Negative for nausea, vomiting, abdominal pain, diarrhea, constipation, blood in stool, abdominal distention and anal bleeding.  Genitourinary: Negative for dysuria, hematuria, flank pain and difficulty urinating.  Musculoskeletal: Negative for myalgias, back pain, joint swelling, arthralgias and gait problem.  Skin: Negative for color change, pallor, rash and wound.  Neurological: Negative  for dizziness, tremors, weakness and light-headedness.  Hematological: Negative for adenopathy. Does not bruise/bleed easily.  Psychiatric/Behavioral: Negative for behavioral problems, confusion, sleep disturbance, dysphoric  mood, decreased concentration and agitation.       Objective:   Physical Exam BP 96/61 mmHg  Pulse 69  Temp(Src) 98.2 F (36.8 C) (Oral)  Wt 150 lb (68.04 kg) gen = a xo by3 in nad heent = no scleral icterus, moist mucous membranes Cors = nl s1,s2, no g/m/r pulm = ctab, no w/c/r Skin = spiders ang+       Assessment & Plan:  Chronic hepatitis c without hepatic coma = completed treatment with harvoni, cured.  Cirrhosis = will get RUQ u/s for [facility] screening. Will refer to [facility] for screening EGD to evaluate for presence of Esophageal Varices.  Health maintenance = patient accidentally received 3rd dose of hepatitis A which should act as a booster. Will disclose to patient  Abstinence = congratulated her own 4th month sobriety and to continue great work with [facility].

## 2015-07-21 ENCOUNTER — Encounter: Payer: Self-pay | Admitting: Internal Medicine

## 2015-07-21 NOTE — Addendum Note (Signed)
Addended by: Myrtis Hopping A on: 07/21/2015 09:11 AM   Modules accepted: Orders

## 2015-07-24 ENCOUNTER — Ambulatory Visit (HOSPITAL_COMMUNITY): Payer: Medicare Other

## 2015-07-31 ENCOUNTER — Telehealth: Payer: Self-pay | Admitting: *Deleted

## 2015-07-31 NOTE — Telephone Encounter (Signed)
Pt missed abdominal U/S appointment, needing new appt.  Returned the pt's call.  Left message to call Radiology Scheduling at [phone] to make a new appt.

## 2015-08-17 ENCOUNTER — Ambulatory Visit (HOSPITAL_COMMUNITY): Payer: Medicare Other

## 2015-09-15 ENCOUNTER — Ambulatory Visit (HOSPITAL_COMMUNITY): Payer: Self-pay | Admitting: Psychiatry

## 2015-09-21 ENCOUNTER — Ambulatory Visit: Payer: Self-pay | Admitting: Internal Medicine

## 2015-11-30 IMAGING — CT CT HEAD W/O CM
2 series · 15 of 30 positions shown, 19 images · non-contrast
Comparison: 06/28/2010 and 09/29/2006 CT. 11/10/2000 MR report.
Films not available for review.

CLINICAL DATA: 58-year-old female with seizure this morning.
History of alcoholism, HIV and GI bleed. Hepatitis-C. Initial
encounter.

EXAM:
CT HEAD WITHOUT CONTRAST
TECHNIQUE: Contiguous axial images were obtained from the base of the skull
through the vertex without intravenous contrast.

[Series 201: head w/o, idose (1) · axial · non-contrast · 0.49mm/px · z∈[+135,+265]mm · 13 of 32 slices shown, 17 images]
[im 3/32  brain]
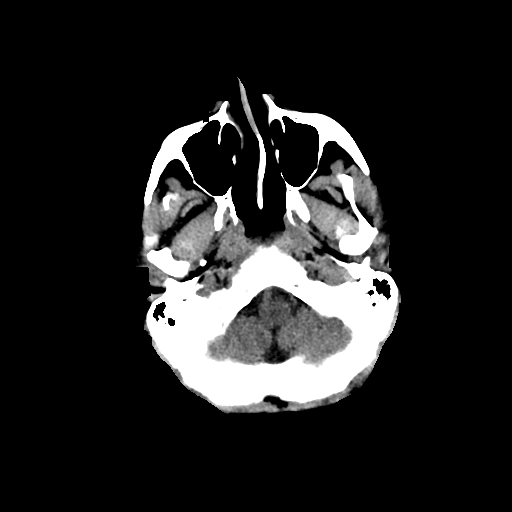
[im 3/32  bone]
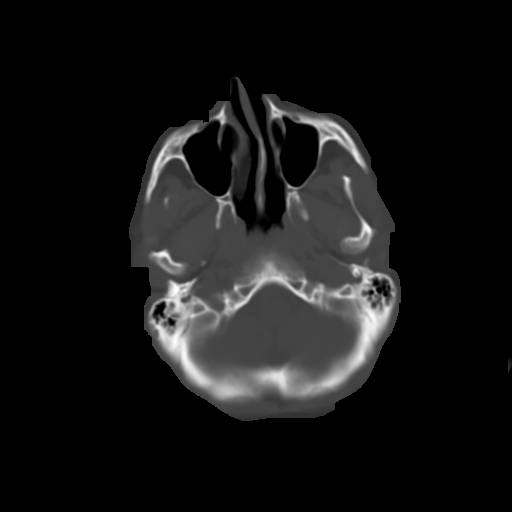
[im 5/32  brain]
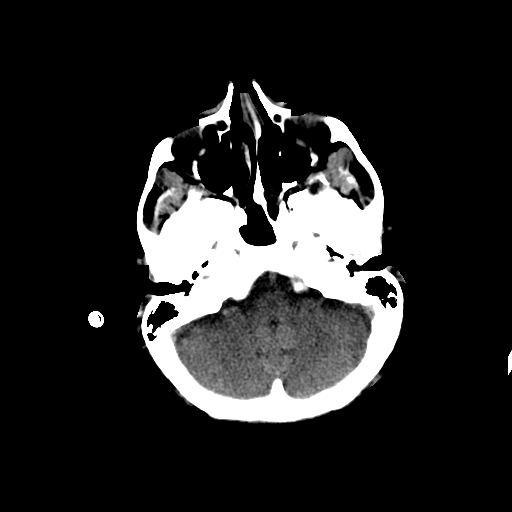
[im 7/32  brain]
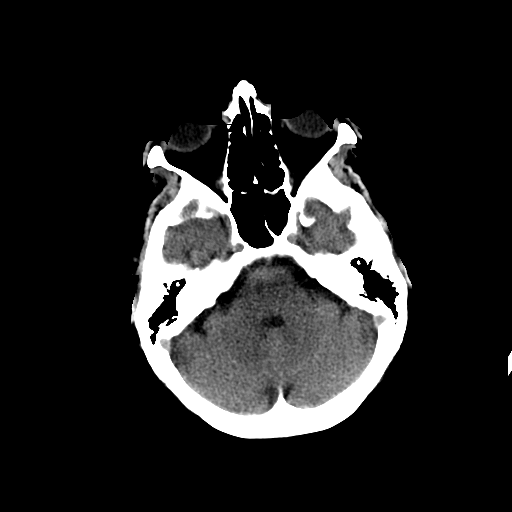
[im 9/32  brain]
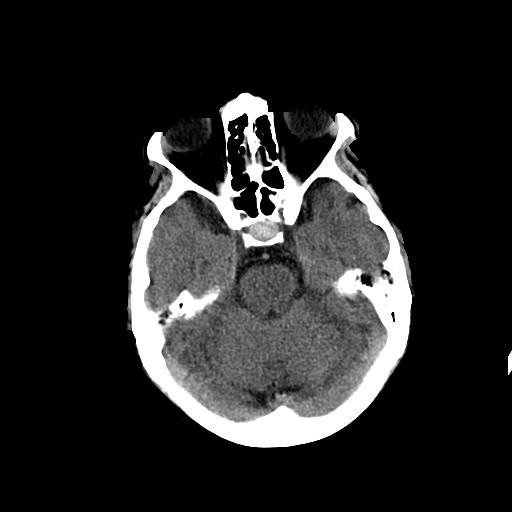
[im 12/32  brain]
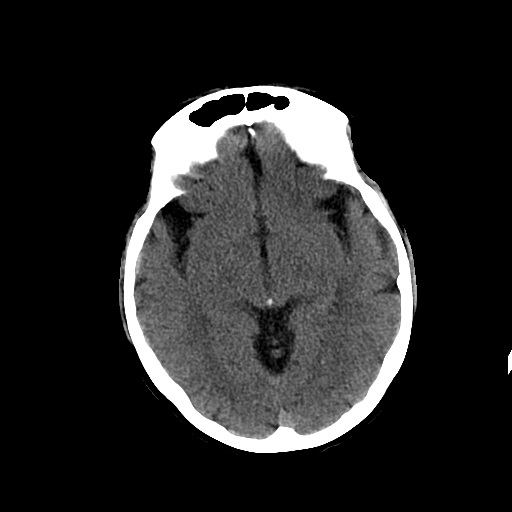
[im 12/32  bone]
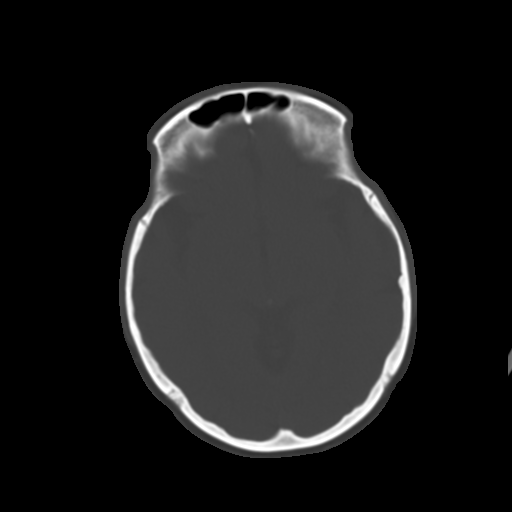
[im 14/32  brain]
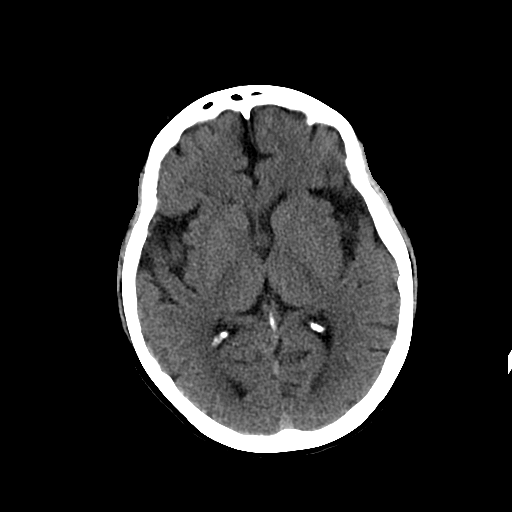
[im 16/32  brain]
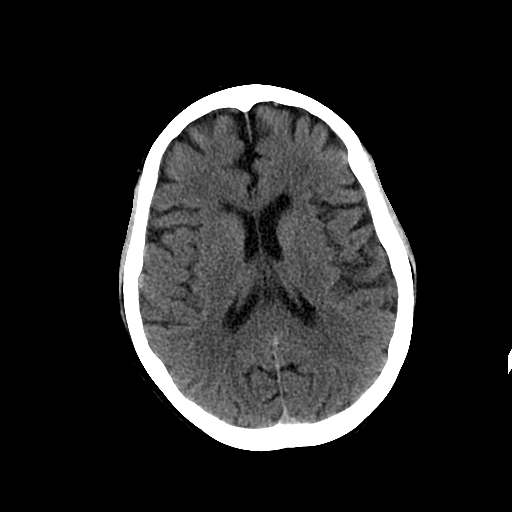
[im 18/32  brain]
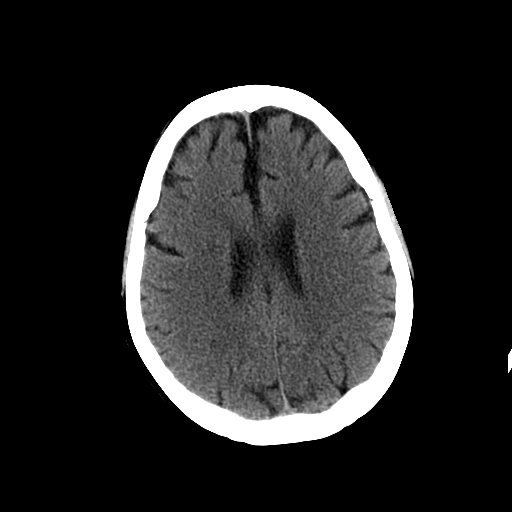
[im 20/32  brain]
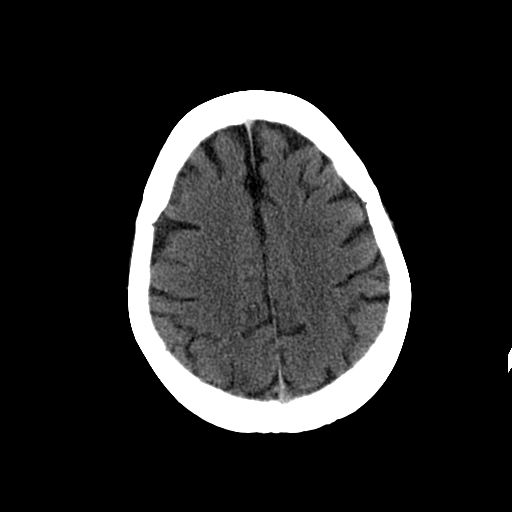
[im 20/32  bone]
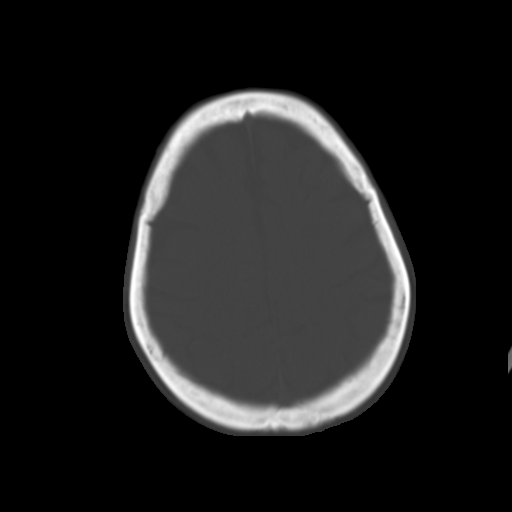
[im 23/32  brain]
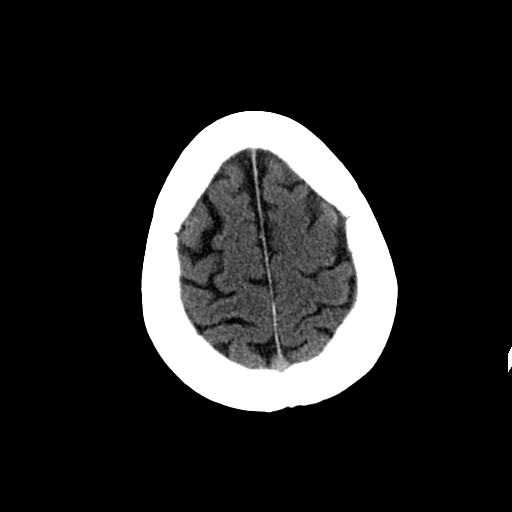
[im 25/32  brain]
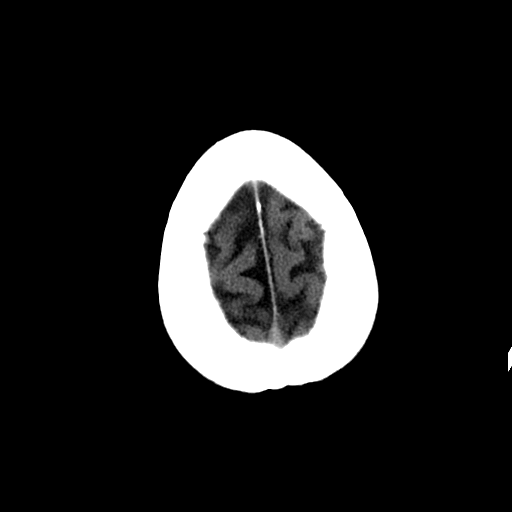
[im 27/32  brain]
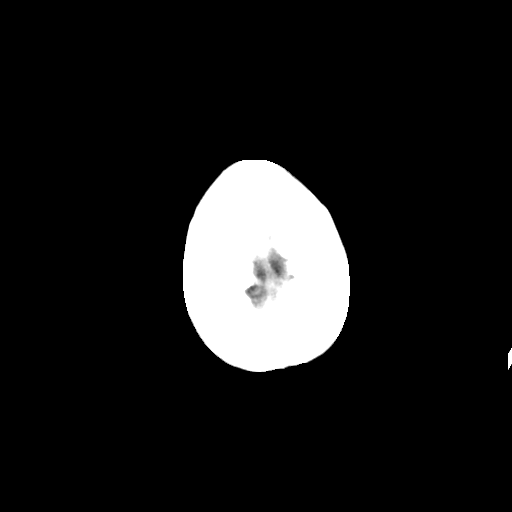
[im 29/32  brain]
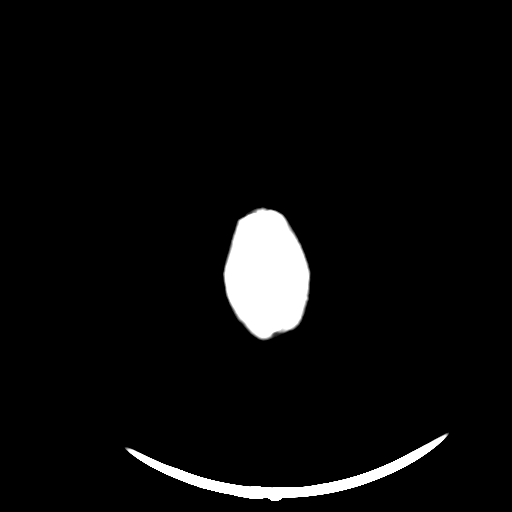
[im 29/32  bone]
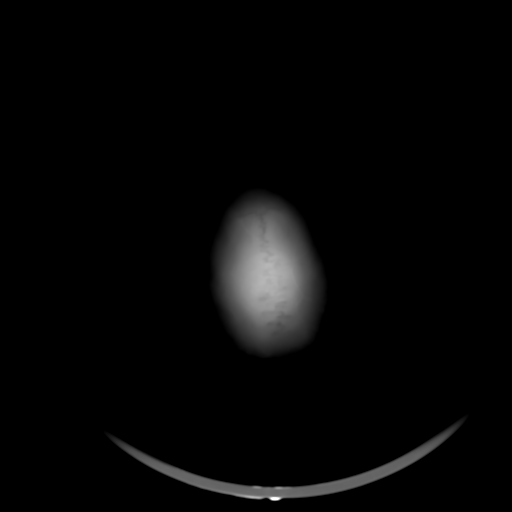

[Series 202: head w/o bone, idose (1) · axial · non-contrast · 0.49mm/px · z∈[+135,+155]mm · 2 of 32 slices shown]
[im 3/32  bone]
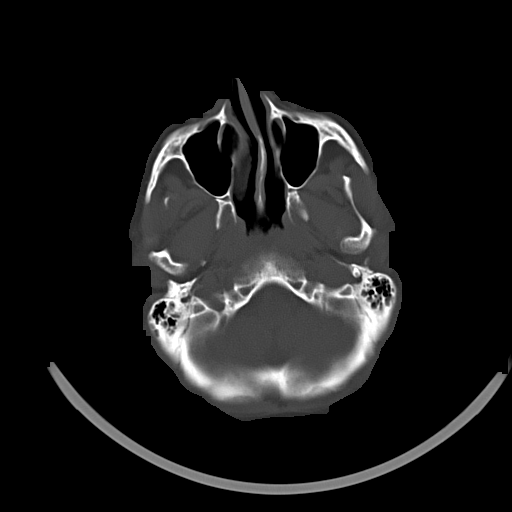
[im 7/32  bone]
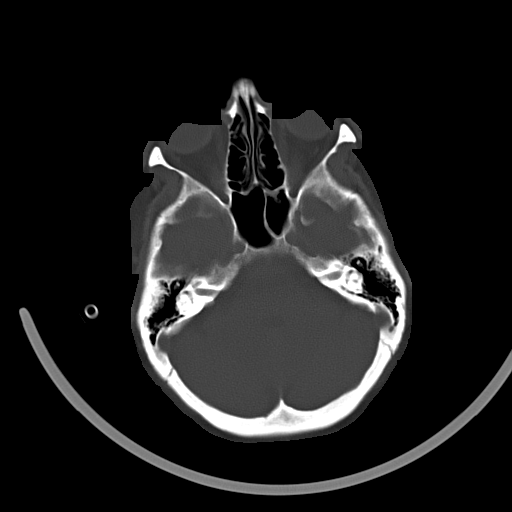

[15 of 30 positions shown; findings below may reference images not displayed]

FINDINGS: No skull fracture or intracranial hemorrhage.

Suprasellar mass with transverse dimension of 1.3 x 1.3 cm without
significant change. This has been described as a Rathke's cleft cyst
on remote MR.

No CT evidence of large acute infarct.

Mild atrophy without hydrocephalus.

Partial opacification left sphenoid sinus air cell.

Orbital structures unremarkable.
IMPRESSION: No skull fracture or intracranial hemorrhage.

Suprasellar mass with transverse dimension of 1.3 x 1.3 cm without
significant change. This has been described as a Rathke's cleft cyst
on remote MR.

No CT evidence of large acute infarct.

Mild atrophy without hydrocephalus.

Partial opacification left sphenoid sinus air cell.

## 2015-12-26 ENCOUNTER — Ambulatory Visit: Payer: Self-pay | Admitting: Neurology

## 2016-01-18 ENCOUNTER — Ambulatory Visit: Payer: Self-pay | Admitting: Internal Medicine

## 2016-03-06 ENCOUNTER — Ambulatory Visit: Payer: Self-pay | Admitting: Internal Medicine

## 2016-03-18 ENCOUNTER — Ambulatory Visit: Payer: Self-pay | Admitting: Neurology

## 2016-03-20 ENCOUNTER — Encounter: Payer: Self-pay | Admitting: Neurology

## 2016-03-20 ENCOUNTER — Ambulatory Visit (INDEPENDENT_AMBULATORY_CARE_PROVIDER_SITE_OTHER): Payer: Medicare Other | Admitting: Neurology

## 2016-03-20 VITALS — BP 100/58 | HR 56 | Temp 97.9°F | Ht 62.5 in | Wt 149.0 lb

## 2016-03-20 DIAGNOSIS — F329 Major depressive disorder, single episode, unspecified: Secondary | ICD-10-CM | POA: Diagnosis not present

## 2016-03-20 DIAGNOSIS — F1010 Alcohol abuse, uncomplicated: Secondary | ICD-10-CM | POA: Diagnosis not present

## 2016-03-20 DIAGNOSIS — F32A Depression, unspecified: Secondary | ICD-10-CM

## 2016-03-20 DIAGNOSIS — G40201 Localization-related (focal) (partial) symptomatic epilepsy and epileptic syndromes with complex partial seizures, not intractable, with status epilepticus: Secondary | ICD-10-CM

## 2016-03-20 MED ORDER — LEVETIRACETAM 1000 MG PO TABS: 1000.0000 mg | ORAL_TABLET | Freq: Two times a day (BID) | ORAL | Status: DC

## 2016-03-20 NOTE — Patient Instructions (Signed)
1. Continue Keppra 1000mg  twice a day 2. Refer to Psychiatry and therapy for severe depression 3. Continue working on stopping alcohol intake 4. Follow-up in 6 months, call for any changes  Seizure Precautions: 1. If medication has been prescribed for you to prevent seizures, take it exactly as directed.  Do not stop taking the medicine without talking to your doctor first, even if you have not had a seizure in a long time.   2. Avoid activities in which a seizure would cause danger to yourself or to others.  Don't operate dangerous machinery, swim alone, or climb in high or dangerous places, such as on ladders, roofs, or girders.  Do not drive unless your doctor says you may.  3. If you have any warning that you may have a seizure, lay down in a safe place where you can't hurt yourself.    4.  No driving for 6 months from last seizure, as per [facility].   Please refer to the following link on the [facility] website for more information: http://www.epilepsyfoundation.org/answerplace/Social/driving/drivingu.cfm   5.  Maintain good sleep hygiene. Avoid alcohol  6.  Contact your doctor if you have any problems that may be related to the medicine you are taking.  7.  Call 911 and bring the patient back to the ED if:        A.  The seizure lasts longer than 5 minutes.       B.  The patient doesn't awaken shortly after the seizure  C.  The patient has new problems such as difficulty seeing, speaking or moving  D.  The patient was injured during the seizure  E.  The patient has a temperature over 102 F (39C)  F.  The patient vomited and now is having trouble breathing
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
## 2016-03-27 ENCOUNTER — Other Ambulatory Visit: Payer: Self-pay | Admitting: Internal Medicine

## 2016-03-27 DIAGNOSIS — Z1231 Encounter for screening mammogram for malignant neoplasm of breast: Secondary | ICD-10-CM

## 2016-03-28 ENCOUNTER — Telehealth: Payer: Self-pay | Admitting: Neurology

## 2016-03-28 NOTE — Telephone Encounter (Signed)
[place] referral faxed to [facility] by Almyra Free, CMA on 03/20/16 to [phone] with confirmation received. They will call patient to schedule.

## 2016-04-03 ENCOUNTER — Ambulatory Visit: Payer: Self-pay

## 2016-04-03 ENCOUNTER — Ambulatory Visit
Admission: RE | Admit: 2016-04-03 | Discharge: 2016-04-03 | Disposition: A | Discharge status: Home / Self Care | Payer: Medicare Other | Source: Ambulatory Visit | Attending: Internal Medicine | Admitting: Internal Medicine

## 2016-04-03 DIAGNOSIS — Z1231 Encounter for screening mammogram for malignant neoplasm of breast: Secondary | ICD-10-CM

## 2016-04-15 ENCOUNTER — Other Ambulatory Visit: Payer: Self-pay | Admitting: Nurse Practitioner

## 2016-04-15 DIAGNOSIS — K7469 Other cirrhosis of liver: Secondary | ICD-10-CM

## 2016-04-23 ENCOUNTER — Encounter: Payer: Self-pay | Admitting: Internal Medicine

## 2016-04-23 ENCOUNTER — Ambulatory Visit (INDEPENDENT_AMBULATORY_CARE_PROVIDER_SITE_OTHER): Payer: Medicare Other | Admitting: Internal Medicine

## 2016-04-23 VITALS — BP 116/72 | HR 53 | Temp 98.3°F | Wt 149.1 lb

## 2016-04-23 DIAGNOSIS — Z23 Encounter for immunization: Secondary | ICD-10-CM

## 2016-04-23 DIAGNOSIS — Z789 Other specified health status: Secondary | ICD-10-CM | POA: Diagnosis not present

## 2016-04-23 DIAGNOSIS — Z7289 Other problems related to lifestyle: Secondary | ICD-10-CM

## 2016-04-23 DIAGNOSIS — B182 Chronic viral hepatitis C: Secondary | ICD-10-CM | POA: Diagnosis not present

## 2016-04-23 LAB — CBC WITH DIFFERENTIAL/PLATELET
BASOS PCT: 1 %
Basophils Absolute: 40 cells/uL (ref 0–200)
Eosinophils Absolute: 200 cells/uL (ref 15–500)
Eosinophils Relative: 5 %
HCT: 47 % — ABNORMAL HIGH (ref 35.0–45.0)
Hemoglobin: 16.5 g/dL — ABNORMAL HIGH (ref 11.7–15.5)
LYMPHS PCT: 35 %
Lymphs Abs: 1400 cells/uL (ref 850–3900)
MCH: 33 pg (ref 27.0–33.0)
MCHC: 35.1 g/dL (ref 32.0–36.0)
MCV: 94 fL (ref 80.0–100.0)
MONO ABS: 320 {cells}/uL (ref 200–950)
MONOS PCT: 8 %
MPV: 10.5 fL (ref 7.5–12.5)
NEUTROS ABS: 2040 {cells}/uL (ref 1500–7800)
Neutrophils Relative %: 51 %
PLATELETS: 66 10*3/uL — AB (ref 140–400)
RBC: 5 MIL/uL (ref 3.80–5.10)
RDW: 14.4 % (ref 11.0–15.0)
WBC: 4 10*3/uL (ref 3.8–10.8)

## 2016-04-23 LAB — COMPLETE METABOLIC PANEL WITH GFR
ALT: 20 U/L (ref 6–29)
AST: 32 U/L (ref 10–35)
Albumin: 4.2 g/dL (ref 3.6–5.1)
Alkaline Phosphatase: 71 U/L (ref 33–130)
BUN: 6 mg/dL — AB (ref 7–25)
CHLORIDE: 107 mmol/L (ref 98–110)
CO2: 28 mmol/L (ref 20–31)
Calcium: 8.8 mg/dL (ref 8.6–10.4)
Creat: 0.73 mg/dL (ref 0.50–0.99)
GFR, Est African American: >89 mL/min (ref >=60)
GFR, Est Non African American: 89 mL/min (ref >=60)
GLUCOSE: 94 mg/dL (ref 65–99)
Potassium: 4.2 mmol/L (ref 3.5–5.3)
Sodium: 141 mmol/L (ref 135–146)
TOTAL PROTEIN: 6.1 g/dL (ref 6.1–8.1)
Total Bilirubin: 1.3 mg/dL — ABNORMAL HIGH (ref 0.2–1.2)

## 2016-04-23 NOTE — Progress Notes (Signed)
Patient ID: Susan Francis, female   DOB: 10-Dec-1955, 60 y.o.   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 Since we last saw her in clinic, she states that she had not had EGD for surveillance for EV. She continues to go to [employer], though reports occasionally has a drink. She associates with management of stress as primary healthcare giver to her spouse.  Outpatient Encounter Prescriptions as of 04/23/2016  Medication Sig  . citalopram (CELEXA) 20 MG tablet TK 1 T PO QD  . ibuprofen (ADVIL,MOTRIN) 200 MG tablet Take 400 mg by mouth every 6 (six) hours as needed for headache.  . levETIRAcetam (KEPPRA) 1000 MG tablet Take 1 tablet (1,000 mg total) by mouth 2 (two) times daily.  . potassium chloride SA (K-DUR,KLOR-CON) 20 MEQ tablet Take 1 tablet (20 mEq total) by mouth daily.   No facility-administered encounter medications on file as of 04/23/2016.      Patient Active Problem List   Diagnosis Date Noted  . Localization-related symptomatic epilepsy and epileptic syndromes with complex partial seizures, not intractable, with status epilepticus ([place]) 12/26/2014  . 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 Clonus 12/26/2014  . Encephalopathy acute   . Thyroid activity decreased   . Encounter for intubation   . Respiratory failure ([place])   . Status epilepticus ([place]) 07/12/2014  . Anxiety state 07/10/2014  . Continuous chronic alcoholism ([place]) 07/10/2014  . Abnormal antinuclear antibody titer 07/10/2014  . 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 Alcoholic ketoacidosis 0000000  . Nausea & vomiting 05/01/2014  . Alcohol abuse 05/01/2014  . Hypokalemia 05/01/2014  . Hypocalcemia 05/01/2014  . Depression 05/01/2014  . Cramp in limb 01/05/2014  . Adnexal mass 06/12/2013  . Cholelithiasis 06/12/2013  . Elevated TSH 06/12/2013  . Nonspecific abnormal electrocardiogram (ECG)  (EKG)   . GERD (gastroesophageal reflux disease) 06/11/2013  . Alcohol dependence ([place]) 06/11/2013  . Uterine mass 06/11/2013  . Colitis 06/11/2013  . Prolonged Q-T interval on ECG 06/11/2013  . Troponin level elevated 06/11/2013  . Seizure ([place])   . Hepatitis C      Health Maintenance Due  Topic Date Due  . TETANUS/TDAP  01/20/1973  . ZOSTAVAX  01/20/2014  . INFLUENZA VACCINE  04/02/2016     Review of Systems + anxiety. Other wise 10 point ros is negative Physical Exam   BP 116/72 (BP Location: Right Arm, Patient Position: Sitting, Cuff Size: Normal)   Pulse (!) 53   Temp 98.3 F (36.8 C) (Oral)   Wt 149 lb 1.9 oz (67.6 kg)   SpO2 98%   BMI 26.84 kg/m  Physical Exam  Constitutional:  oriented to person, place, and time. appears well-developed and well-nourished. No distress.  HENT: [place]/AT, PERRLA, no scleral icterus Mouth/Throat: Oropharynx is clear and moist. No oropharyngeal exudate.  Cardiovascular: Normal rate, regular rhythm and normal heart sounds. Exam reveals no gallop and no friction rub.  No murmur heard.  Pulmonary/Chest: Effort normal and breath sounds normal. No respiratory distress.  has no wheezes.  Neck = supple, no nuchal rigidity Abdominal: Soft. Bowel sounds are normal.  exhibits no distension. There is no tenderness.  Lymphadenopathy: no cervical adenopathy. No axillary adenopathy Neurological: alert and oriented to person, place, and time.  Skin: Skin is warm and dry. + stigmata of liver disease  Psychiatric: a normal mood and affect.  behavior is normal.  Lab Results  Component Value Date   HEPBSAB NEG 02/24/2014   No results found for: RPR  CBC Lab Results  Component Value Date   WBC 4.2 05/14/2015   RBC 4.82 05/14/2015   HGB 15.7 (H) 05/14/2015   HCT 44.0 05/14/2015   PLT 40 (L) 05/14/2015   MCV 91.3 05/14/2015   MCH 32.6 05/14/2015   MCHC 35.7 05/14/2015   RDW 14.0 05/14/2015   LYMPHSABS 0.9 05/14/2015   MONOABS 0.2 05/14/2015     EOSABS 0.1 05/14/2015   BASOSABS 0.0 05/14/2015   BMET Lab Results  Component Value Date   NA 137 05/14/2015   K 3.6 05/14/2015   CL 104 05/14/2015   CO2 22 05/14/2015   GLUCOSE 162 (H) 05/14/2015   BUN 13 05/14/2015   CREATININE 0.86 05/14/2015   CALCIUM 9.2 05/14/2015   GFRNONAA >60 05/14/2015   GFRAA >60 05/14/2015     Assessment and Plan  Chronic hepatitis C without hepatic coma, treated in 2016: Will check ultrasound results that will be due on aug 31st. Also refer to gi for endoscopy, eso variceal screening which was not scheduled at last appt for some reason   Health maintenance = will do flu vaccine today  Recovering alcohol abuse/use= has been in aa with sponsor. She may have had a drink roughly 3 months ago. But received her 1 yr chip at aa at end of july   rtc in 6 months will repeat US

## 2016-04-24 LAB — PROTIME-INR
INR: 1.1
Prothrombin Time: 11.3 s (ref 9.0–11.5)

## 2016-04-25 ENCOUNTER — Other Ambulatory Visit: Payer: Self-pay

## 2016-04-25 DIAGNOSIS — B182 Chronic viral hepatitis C: Secondary | ICD-10-CM

## 2016-04-25 DIAGNOSIS — K7460 Unspecified cirrhosis of liver: Secondary | ICD-10-CM

## 2016-05-02 ENCOUNTER — Ambulatory Visit
Admission: RE | Admit: 2016-05-02 | Discharge: 2016-05-02 | Disposition: A | Discharge status: Home / Self Care | Payer: Medicare Other | Source: Ambulatory Visit | Attending: Nurse Practitioner | Admitting: Nurse Practitioner

## 2016-05-02 DIAGNOSIS — K7469 Other cirrhosis of liver: Secondary | ICD-10-CM

## 2016-05-03 ENCOUNTER — Other Ambulatory Visit: Payer: Self-pay | Admitting: Gastroenterology

## 2016-05-14 ENCOUNTER — Telehealth (HOSPITAL_COMMUNITY): Payer: Self-pay | Admitting: *Deleted

## 2016-05-14 NOTE — Telephone Encounter (Signed)
left voice message regarding an appointment. 

## 2016-05-21 ENCOUNTER — Telehealth (HOSPITAL_COMMUNITY): Payer: Self-pay | Admitting: *Deleted

## 2016-05-21 ENCOUNTER — Other Ambulatory Visit: Payer: Self-pay | Admitting: Internal Medicine

## 2016-05-21 ENCOUNTER — Other Ambulatory Visit (HOSPITAL_COMMUNITY)
Admission: RE | Admit: 2016-05-21 | Discharge: 2016-05-21 | Disposition: A | Discharge status: Home / Self Care | Payer: Medicare Other | Source: Ambulatory Visit

## 2016-05-21 DIAGNOSIS — Z01419 Encounter for gynecological examination (general) (routine) without abnormal findings: Secondary | ICD-10-CM | POA: Insufficient documentation

## 2016-05-21 DIAGNOSIS — Z113 Encounter for screening for infections with a predominantly sexual mode of transmission: Secondary | ICD-10-CM | POA: Insufficient documentation

## 2016-05-21 NOTE — Telephone Encounter (Signed)
phone call, message stated that the person you have dialed is not able to receive calls.

## 2016-05-23 LAB — CYTOLOGY - PAP

## 2016-05-31 ENCOUNTER — Encounter (HOSPITAL_COMMUNITY): Payer: Self-pay | Admitting: *Deleted

## 2016-06-03 ENCOUNTER — Encounter (HOSPITAL_COMMUNITY)
Admission: RE | Disposition: A | Discharge status: Home / Self Care | Payer: Self-pay | Source: Ambulatory Visit | Attending: Gastroenterology

## 2016-06-03 ENCOUNTER — Ambulatory Visit (HOSPITAL_COMMUNITY)
Admission: RE | Admit: 2016-06-03 | Discharge: 2016-06-03 | Disposition: A | Discharge status: Home / Self Care | Payer: Medicare Other | Source: Ambulatory Visit | Attending: Gastroenterology | Admitting: Gastroenterology

## 2016-06-03 ENCOUNTER — Ambulatory Visit (HOSPITAL_COMMUNITY): Payer: Medicare Other | Admitting: Certified Registered Nurse Anesthetist

## 2016-06-03 ENCOUNTER — Encounter (HOSPITAL_COMMUNITY): Payer: Self-pay

## 2016-06-03 DIAGNOSIS — G40909 Epilepsy, unspecified, not intractable, without status epilepticus: Secondary | ICD-10-CM | POA: Diagnosis not present

## 2016-06-03 DIAGNOSIS — K745 Biliary cirrhosis, unspecified: Secondary | ICD-10-CM | POA: Diagnosis not present

## 2016-06-03 DIAGNOSIS — K219 Gastro-esophageal reflux disease without esophagitis: Secondary | ICD-10-CM | POA: Insufficient documentation

## 2016-06-03 DIAGNOSIS — B1920 Unspecified viral hepatitis C without hepatic coma: Secondary | ICD-10-CM | POA: Insufficient documentation

## 2016-06-03 DIAGNOSIS — F17200 Nicotine dependence, unspecified, uncomplicated: Secondary | ICD-10-CM | POA: Diagnosis not present

## 2016-06-03 DIAGNOSIS — F329 Major depressive disorder, single episode, unspecified: Secondary | ICD-10-CM | POA: Insufficient documentation

## 2016-06-03 DIAGNOSIS — F418 Other specified anxiety disorders: Secondary | ICD-10-CM | POA: Diagnosis not present

## 2016-06-03 DIAGNOSIS — D696 Thrombocytopenia, unspecified: Secondary | ICD-10-CM | POA: Diagnosis not present

## 2016-06-03 DIAGNOSIS — K279 Peptic ulcer, site unspecified, unspecified as acute or chronic, without hemorrhage or perforation: Secondary | ICD-10-CM | POA: Diagnosis not present

## 2016-06-03 HISTORY — PX: ESOPHAGOGASTRODUODENOSCOPY (EGD) WITH PROPOFOL: SHX5813

## 2016-06-03 HISTORY — DX: Qualitative platelet defects: D69.1

## 2016-06-03 SURGERY — ESOPHAGOGASTRODUODENOSCOPY (EGD) WITH PROPOFOL
Anesthesia: Monitor Anesthesia Care

## 2016-06-03 MED ORDER — LIDOCAINE 2% (20 MG/ML) 5 ML SYRINGE
INTRAMUSCULAR | Status: AC
  Filled 2016-06-03: qty 5

## 2016-06-03 MED ORDER — LIDOCAINE 2% (20 MG/ML) 5 ML SYRINGE
INTRAMUSCULAR | Status: DC | PRN
  Administered 2016-06-03: 80 mg via INTRAVENOUS

## 2016-06-03 MED ORDER — PROPOFOL 10 MG/ML IV BOLUS
INTRAVENOUS | Status: AC
  Filled 2016-06-03: qty 40

## 2016-06-03 MED ORDER — LACTATED RINGERS IV SOLN
INTRAVENOUS | Status: DC | PRN
Start: 2016-06-03 — End: 2016-06-03
  Administered 2016-06-03: 14:00:00 via INTRAVENOUS

## 2016-06-03 MED ORDER — SODIUM CHLORIDE 0.9 % IV SOLN: INTRAVENOUS | Status: DC

## 2016-06-03 MED ORDER — PROPOFOL 500 MG/50ML IV EMUL
INTRAVENOUS | Status: DC | PRN
  Administered 2016-06-03: 150 ug/kg/min via INTRAVENOUS

## 2016-06-03 MED ORDER — PROPOFOL 10 MG/ML IV BOLUS
INTRAVENOUS | Status: DC | PRN
  Administered 2016-06-03 (×2): 20 mg via INTRAVENOUS

## 2016-06-03 MED ORDER — ONDANSETRON HCL 4 MG/2ML IJ SOLN
INTRAMUSCULAR | Status: AC
  Filled 2016-06-03: qty 2

## 2016-06-03 MED ORDER — ONDANSETRON HCL 4 MG/2ML IJ SOLN
INTRAMUSCULAR | Status: DC | PRN
  Administered 2016-06-03: 4 mg via INTRAVENOUS

## 2016-06-03 SURGICAL SUPPLY — 15 items

## 2016-06-03 NOTE — Transfer of Care (Signed)
  Immediate Anesthesia Transfer of Care Note  Patient: Susan Francis  Procedure(s) Performed: Procedure(s): ESOPHAGOGASTRODUODENOSCOPY (EGD) WITH PROPOFOL (N/A)  Patient Location: PACU  Anesthesia Type:MAC  Level of Consciousness:  sedated, patient cooperative and responds to stimulation  Airway & Oxygen Therapy:Patient Spontanous Breathing and Patient connected to face mask oxgen  Post-op Assessment:  Report given to PACU RN and Post -op Vital signs reviewed and stable  Post vital signs:  Reviewed and stable  Last Vitals:  Vitals:   06/03/16 1317  BP: (!) 103/59  Pulse: (!) 44  Resp: 16    Complications: No apparent anesthesia complications

## 2016-06-03 NOTE — Op Note (Signed)
[facility] Patient Name: Susan Francis Procedure Date: 06/03/2016 MRN: [id number] Attending MD: Garlan Fair , MD Date of Birth: March 23, 1956 CSN: [id number] Age: 60 Admit Type: Outpatient Procedure:                Upper GI endoscopy Indications:              Hepatitis C cirrhosis ( F4 elastography) Providers:                Garlan Fair, MD, Zenon Mayo, RN, [facility], Technician, Christell Faith, CRNA Referring MD:              Medicines:                Propofol per Anesthesia Complications:            No immediate complications. Estimated Blood Loss:     Estimated blood loss: none. Procedure:                Pre-Anesthesia Assessment:                           - Prior to the procedure, a History and Physical                            was performed, and patient medications and                            allergies were reviewed. The patient's tolerance of                            previous anesthesia was also reviewed. The risks                            and benefits of the procedure and the sedation                            options and risks were discussed with the patient.                            All questions were answered, and informed consent                            was obtained. Prior Anticoagulants: The patient has                            taken no previous anticoagulant or antiplatelet                            agents. ASA Grade Assessment: III - A patient with                            severe systemic disease. After reviewing the risks  and benefits, the patient was deemed in                            satisfactory condition to undergo the procedure.                           After obtaining informed consent, the endoscope was                            passed under direct vision. Throughout the                            procedure, the patient's blood pressure, pulse, and                     oxygen saturations were monitored continuously. The                            Endoscope was introduced through the mouth, and                            advanced to the second part of duodenum. The upper                            GI endoscopy was accomplished without difficulty.                            The patient tolerated the procedure well. Findings:      The Z-line was regular and was found 36 cm from the incisors.      The examined esophagus was normal. No esophageal varices were present.      The entire examined stomach was normal. No gastric varices were present.      The examined duodenum was normal. Impression:               - Z-line regular, 36 cm from the incisors.                           - Normal esophagus.                           - Normal stomach.                           - Normal examined duodenum.                           - No specimens collected. Moderate Sedation:      N/A- Per Anesthesia Care Recommendation:           - Patient has a contact number available for                            emergencies. The signs and symptoms of potential                            delayed complications were discussed with the  patient. Return to normal activities tomorrow.                            Written discharge instructions were provided to the                            patient.                           - Return to primary care physician PRN.                           - Resume previous diet.                           - Continue present medications. Procedure Code(s):        --- Professional ---                           [phone], Esophagogastroduodenoscopy, flexible,                            transoral; diagnostic, including collection of                            specimen(s) by brushing or washing, when performed                            (separate procedure) Diagnosis Code(s):        --- Professional ---                            K74.60, Unspecified cirrhosis of liver CPT copyright 2016 American Medical Association. All rights reserved. The codes documented in this report are preliminary and upon coder review may  be revised to meet current compliance requirements. Earle Gell, MD Garlan Fair, MD 06/03/2016 2:06:39 PM This report has been signed electronically. Number of Addenda: 0

## 2016-06-03 NOTE — H&P (Signed)
Procedure: Diagnostic esophagogastroduodenoscopy to look for esophagogastric varices. Chronic hepatitis C cirrhosis. Chronic alcoholism. 05/24/2014 liver ultrasound elastography showed a nodular liver, 1.6 cm gallstone, F4 fibrosis. 07/27/2013 colonoscopy was performed with removal of a 5 mm rectal adenomatous polyp. Schedule surveillance colonoscopy in November 2019.  History: The patient is a 60 year old female born 02-03-1956. She is undergoing a screening esophagogastroduodenoscopy to look for esophagogastric varices. She received curative therapy for hepatitis C and has remained alcohol abstinent for one year.  Past medical history: Seizure disorder. Depression. Hepatitis C cirrhosis complicated by thrombocytopenia. Uterine fibroid. Right rotator cuff surgery. Cervical spine surgery.  Medication allergies: OxyContin  Exam: The patient is alert and lying comfortably on the endoscopy stretcher. Abdomen is soft and nontender to palpation. Lungs are clear to auscultation. Cardiac exam reveals a regular rhythm.  Plan: Proceed with screening esophagogastroduodenoscopy.

## 2016-06-03 NOTE — Anesthesia Postprocedure Evaluation (Signed)
Anesthesia Post Note  Patient: Susan Francis  Procedure(s) Performed: Procedure(s) (LRB): ESOPHAGOGASTRODUODENOSCOPY (EGD) WITH PROPOFOL (N/A)  Patient location during evaluation: PACU Anesthesia Type: MAC Level of consciousness: awake and alert Pain management: pain level controlled Vital Signs Assessment: post-procedure vital signs reviewed and stable Respiratory status: spontaneous breathing, nonlabored ventilation, respiratory function stable and patient connected to nasal cannula oxygen Cardiovascular status: stable and blood pressure returned to baseline Anesthetic complications: no    Last Vitals:  Vitals:   06/03/16 1410 06/03/16 1420  BP: (!) 120/96 (!) 141/80  Pulse: (!) 59 (!) 55  Resp: 14 16    Last Pain:  Vitals:   06/03/16 1317  TempSrc: Oral                 Carie Kapuscinski J

## 2016-06-03 NOTE — Anesthesia Preprocedure Evaluation (Addendum)
Anesthesia Evaluation  Patient identified by MRN, date of birth, ID band Patient awake    Reviewed: Allergy & Precautions, NPO status , Patient's Chart, lab work & pertinent test results  Airway Mallampati: II  TM Distance: >3 FB Neck ROM: Full    Dental no notable dental hx.    Pulmonary Current Smoker,    Pulmonary exam normal breath sounds clear to auscultation       Cardiovascular negative cardio ROS Normal cardiovascular exam Rhythm:Regular Rate:Normal     Neuro/Psych Seizures -,  PSYCHIATRIC DISORDERS Anxiety Depression    GI/Hepatic PUD, GERD  ,(+)     substance abuse  alcohol use, Hepatitis -, C  Endo/Other  Hypothyroidism   Renal/GU negative Renal ROS  negative genitourinary   Musculoskeletal negative musculoskeletal ROS (+)   Abdominal   Peds negative pediatric ROS (+)  Hematology negative hematology ROS (+)   Anesthesia Other Findings   Reproductive/Obstetrics negative OB ROS                            Anesthesia Physical Anesthesia Plan  ASA: III  Anesthesia Plan: MAC   Post-op Pain Management:    Induction: Intravenous  Airway Management Planned: Natural Airway  Additional Equipment:   Intra-op Plan:   Post-operative Plan:   Informed Consent: I have reviewed the patients History and Physical, chart, labs and discussed the procedure including the risks, benefits and alternatives for the proposed anesthesia with the patient or authorized representative who has indicated his/her understanding and acceptance.   Dental advisory given  Plan Discussed with: CRNA  Anesthesia Plan Comments:        Anesthesia Quick Evaluation

## 2016-06-03 NOTE — Discharge Instructions (Signed)
YOU HAD AN ENDOSCOPIC PROCEDURE TODAY: Refer to the procedure report and other information in the discharge instructions given to you for any specific questions about what was found during the examination. If this information does not answer your questions, please call Dr. Wynetta Emery office at [phone] to clarify.   YOU SHOULD EXPECT: Some feelings of bloating in the abdomen. Passage of more gas than usual. Walking can help get rid of the air that was put into your GI tract during the procedure and reduce the bloating. If you had a lower endoscopy (such as a colonoscopy or flexible sigmoidoscopy) you may notice spotting of blood in your stool or on the toilet paper. Some abdominal soreness may be present for a day or two, also.  DIET: Your first meal following the procedure should be a light meal and then it is ok to progress to your normal diet. A half-sandwich or bowl of soup is an example of a good first meal. Heavy or fried foods are harder to digest and may make you feel nauseous or bloated. Drink plenty of fluids but you should avoid alcoholic beverages for 24 hours. If you had a esophageal dilation, please see attached instructions for diet.   ACTIVITY: Your care partner should take you home directly after the procedure. You should plan to take it easy, moving slowly for the rest of the day. You can resume normal activity the day after the procedure however YOU SHOULD NOT DRIVE, use power tools, machinery or perform tasks that involve climbing or major physical exertion for 24 hours (because of the sedation medicines used during the test).   SYMPTOMS TO REPORT IMMEDIATELY: A gastroenterologist can be reached at any hour. Please call [phone]  for any of the following symptoms:  Following lower endoscopy (colonoscopy, flexible sigmoidoscopy) Excessive amounts of blood in the stool  Significant tenderness, worsening of abdominal pains  Swelling of the abdomen that is new, acute  Fever of 100  or higher  Following upper endoscopy (EGD, EUS, ERCP, esophageal dilation) Vomiting of blood or coffee ground material  New, significant abdominal pain  New, significant chest pain or pain under the shoulder blades  Painful or persistently difficult swallowing  New shortness of breath  Black, tarry-looking or red, bloody stools  FOLLOW UP:  If any biopsies were taken you will be contacted by phone or by letter within the next 1-3 weeks. Call [phone]  if you have not heard about the biopsies in 3 weeks.  Please also call with any specific questions about appointments or follow up tests.

## 2016-06-03 NOTE — Anesthesia Procedure Notes (Signed)
Procedure Name: MAC Date/Time: 06/03/2016 1:45 PM Performed by: West Pugh Pre-anesthesia Checklist: Patient identified, Timeout performed, Emergency Drugs available, Suction available and Patient being monitored Patient Re-evaluated:Patient Re-evaluated prior to inductionOxygen Delivery Method: Nasal cannula Placement Confirmation: positive ETCO2 Dental Injury: Teeth and Oropharynx as per pre-operative assessment

## 2016-06-04 ENCOUNTER — Encounter (HOSPITAL_COMMUNITY): Payer: Self-pay | Admitting: Gastroenterology

## 2016-06-26 ENCOUNTER — Telehealth (HOSPITAL_COMMUNITY): Payer: Self-pay | Admitting: *Deleted

## 2016-06-26 NOTE — Telephone Encounter (Signed)
spoke with patient today 06/26/16.   she said no spoke with her regarding an appointment.  she said she will pass.

## 2016-08-21 ENCOUNTER — Other Ambulatory Visit: Payer: Self-pay | Admitting: Neurology

## 2016-08-21 DIAGNOSIS — G40201 Localization-related (focal) (partial) symptomatic epilepsy and epileptic syndromes with complex partial seizures, not intractable, with status epilepticus: Secondary | ICD-10-CM

## 2016-08-21 NOTE — Telephone Encounter (Signed)
RX sent to pharmacy. Per last OV note patient to continue.  

## 2016-09-23 ENCOUNTER — Encounter: Payer: Self-pay | Admitting: Neurology

## 2016-09-23 ENCOUNTER — Ambulatory Visit (INDEPENDENT_AMBULATORY_CARE_PROVIDER_SITE_OTHER): Payer: Medicare HMO | Admitting: Neurology

## 2016-09-23 VITALS — BP 114/62 | HR 89 | Ht 61.0 in | Wt 154.2 lb

## 2016-09-23 DIAGNOSIS — G40201 Localization-related (focal) (partial) symptomatic epilepsy and epileptic syndromes with complex partial seizures, not intractable, with status epilepticus: Secondary | ICD-10-CM

## 2016-09-23 MED ORDER — LEVETIRACETAM 1000 MG PO TABS: 1000.0000 mg | ORAL_TABLET | Freq: Two times a day (BID) | ORAL | 3 refills | Status: DC

## 2016-09-23 NOTE — Patient Instructions (Signed)
1. Continue Keppra 1000mg  twice a day 2. Keep a calendar of your symptoms, if they are increasing in frequency, call our office and we will schedule a prolonged EEG 3. Follow-up in 6 months, call for any changes  Seizure Precautions: 1. If medication has been prescribed for you to prevent seizures, take it exactly as directed.  Do not stop taking the medicine without talking to your doctor first, even if you have not had a seizure in a long time.   2. Avoid activities in which a seizure would cause danger to yourself or to others.  Don't operate dangerous machinery, swim alone, or climb in high or dangerous places, such as on ladders, roofs, or girders.  Do not drive unless your doctor says you may.  3. If you have any warning that you may have a seizure, lay down in a safe place where you can't hurt yourself.    4.  No driving for 6 months from last seizure, as per [facility].   Please refer to the following link on the [facility] website for more information: http://[email or web address]/[email or web address]   5.  Maintain good sleep hygiene. Avoid alcohol.  6.  Contact your doctor if you have any problems that may be related to the medicine you are taking.  7.  Call 911 and bring the patient back to the ED if:        A.  The seizure lasts longer than 5 minutes.       B.  The patient doesn't awaken shortly after the seizure  C.  The patient has new problems such as difficulty seeing, speaking or moving  D.  The patient was injured during the seizure  E.  The patient has a temperature over 102 F (39C)  F.  The patient vomited and now is having trouble breathing
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
## 2016-10-10 ENCOUNTER — Other Ambulatory Visit: Payer: Self-pay

## 2016-10-24 ENCOUNTER — Ambulatory Visit: Payer: Medicare HMO | Admitting: Internal Medicine

## 2016-12-12 ENCOUNTER — Other Ambulatory Visit: Payer: Self-pay

## 2016-12-18 ENCOUNTER — Other Ambulatory Visit: Payer: Medicare HMO

## 2016-12-18 DIAGNOSIS — E039 Hypothyroidism, unspecified: Secondary | ICD-10-CM | POA: Diagnosis not present

## 2016-12-18 DIAGNOSIS — B182 Chronic viral hepatitis C: Secondary | ICD-10-CM

## 2016-12-18 DIAGNOSIS — G40909 Epilepsy, unspecified, not intractable, without status epilepticus: Secondary | ICD-10-CM | POA: Diagnosis not present

## 2016-12-18 DIAGNOSIS — F339 Major depressive disorder, recurrent, unspecified: Secondary | ICD-10-CM | POA: Diagnosis not present

## 2016-12-18 DIAGNOSIS — K7460 Unspecified cirrhosis of liver: Secondary | ICD-10-CM | POA: Diagnosis not present

## 2016-12-18 DIAGNOSIS — B1920 Unspecified viral hepatitis C without hepatic coma: Secondary | ICD-10-CM | POA: Diagnosis not present

## 2016-12-18 DIAGNOSIS — Z720 Tobacco use: Secondary | ICD-10-CM | POA: Diagnosis not present

## 2016-12-18 DIAGNOSIS — R252 Cramp and spasm: Secondary | ICD-10-CM | POA: Diagnosis not present

## 2016-12-18 DIAGNOSIS — D696 Thrombocytopenia, unspecified: Secondary | ICD-10-CM | POA: Diagnosis not present

## 2016-12-18 DIAGNOSIS — F329 Major depressive disorder, single episode, unspecified: Secondary | ICD-10-CM | POA: Diagnosis not present

## 2016-12-18 LAB — COMPREHENSIVE METABOLIC PANEL
ALT: 14 U/L (ref 6–29)
AST: 22 U/L (ref 10–35)
Albumin: 4.2 g/dL (ref 3.6–5.1)
Alkaline Phosphatase: 72 U/L (ref 33–130)
BUN: 6 mg/dL — AB (ref 7–25)
CO2: 27 mmol/L (ref 20–31)
CREATININE: 0.78 mg/dL (ref 0.50–0.99)
Calcium: 9 mg/dL (ref 8.6–10.4)
Chloride: 103 mmol/L (ref 98–110)
Glucose, Bld: 88 mg/dL (ref 65–99)
Potassium: 3.6 mmol/L (ref 3.5–5.3)
SODIUM: 141 mmol/L (ref 135–146)
TOTAL PROTEIN: 6.3 g/dL (ref 6.1–8.1)
Total Bilirubin: 0.8 mg/dL (ref 0.2–1.2)

## 2016-12-20 LAB — HEPATITIS C RNA QUANTITATIVE
HCV Quantitative Log: <1.18 Log IU/mL
HCV Quantitative: <15 IU/mL

## 2017-01-01 ENCOUNTER — Ambulatory Visit: Payer: Self-pay | Admitting: Internal Medicine

## 2017-01-10 DIAGNOSIS — S51012A Laceration without foreign body of left elbow, initial encounter: Secondary | ICD-10-CM | POA: Diagnosis not present

## 2017-02-04 ENCOUNTER — Ambulatory Visit (INDEPENDENT_AMBULATORY_CARE_PROVIDER_SITE_OTHER): Payer: Medicare HMO | Admitting: Psychology

## 2017-02-04 ENCOUNTER — Encounter (HOSPITAL_COMMUNITY): Payer: Self-pay | Admitting: Psychology

## 2017-02-04 DIAGNOSIS — F1220 Cannabis dependence, uncomplicated: Secondary | ICD-10-CM | POA: Diagnosis not present

## 2017-02-04 DIAGNOSIS — F1020 Alcohol dependence, uncomplicated: Secondary | ICD-10-CM | POA: Diagnosis not present

## 2017-02-05 ENCOUNTER — Ambulatory Visit: Payer: Self-pay | Admitting: Internal Medicine

## 2017-02-05 ENCOUNTER — Other Ambulatory Visit (HOSPITAL_COMMUNITY): Payer: Medicare HMO

## 2017-02-06 ENCOUNTER — Other Ambulatory Visit (HOSPITAL_COMMUNITY): Payer: Medicare HMO | Attending: Psychiatry | Admitting: Psychology

## 2017-02-06 DIAGNOSIS — K7030 Alcoholic cirrhosis of liver without ascites: Secondary | ICD-10-CM | POA: Diagnosis not present

## 2017-02-06 DIAGNOSIS — F1220 Cannabis dependence, uncomplicated: Secondary | ICD-10-CM | POA: Diagnosis not present

## 2017-02-06 DIAGNOSIS — Z6372 Alcoholism and drug addiction in family: Secondary | ICD-10-CM | POA: Insufficient documentation

## 2017-02-06 DIAGNOSIS — F1020 Alcohol dependence, uncomplicated: Secondary | ICD-10-CM | POA: Diagnosis not present

## 2017-02-06 DIAGNOSIS — E872 Acidosis: Secondary | ICD-10-CM | POA: Diagnosis not present

## 2017-02-06 DIAGNOSIS — F10239 Alcohol dependence with withdrawal, unspecified: Secondary | ICD-10-CM | POA: Diagnosis not present

## 2017-02-06 DIAGNOSIS — F4312 Post-traumatic stress disorder, chronic: Secondary | ICD-10-CM | POA: Insufficient documentation

## 2017-02-07 ENCOUNTER — Encounter (HOSPITAL_COMMUNITY): Payer: Self-pay
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
## 2017-02-10 ENCOUNTER — Encounter (HOSPITAL_COMMUNITY): Payer: Self-pay | Admitting: Medical

## 2017-02-10 ENCOUNTER — Other Ambulatory Visit (INDEPENDENT_AMBULATORY_CARE_PROVIDER_SITE_OTHER): Payer: Medicare HMO | Admitting: Psychology

## 2017-02-10 VITALS — BP 122/68 | HR 66 | Ht 61.0 in | Wt 148.2 lb

## 2017-02-10 DIAGNOSIS — F19982 Other psychoactive substance use, unspecified with psychoactive substance-induced sleep disorder: Secondary | ICD-10-CM

## 2017-02-10 DIAGNOSIS — F4312 Post-traumatic stress disorder, chronic: Secondary | ICD-10-CM | POA: Diagnosis not present

## 2017-02-10 DIAGNOSIS — Z8619 Personal history of other infectious and parasitic diseases: Secondary | ICD-10-CM

## 2017-02-10 DIAGNOSIS — F329 Major depressive disorder, single episode, unspecified: Secondary | ICD-10-CM

## 2017-02-10 DIAGNOSIS — F1020 Alcohol dependence, uncomplicated: Secondary | ICD-10-CM | POA: Diagnosis not present

## 2017-02-10 DIAGNOSIS — T7491XS Unspecified adult maltreatment, confirmed, sequela: Secondary | ICD-10-CM | POA: Diagnosis not present

## 2017-02-10 DIAGNOSIS — F10239 Alcohol dependence with withdrawal, unspecified: Secondary | ICD-10-CM | POA: Diagnosis not present

## 2017-02-10 DIAGNOSIS — Z6372 Alcoholism and drug addiction in family: Secondary | ICD-10-CM

## 2017-02-10 DIAGNOSIS — K7030 Alcoholic cirrhosis of liver without ascites: Secondary | ICD-10-CM | POA: Diagnosis not present

## 2017-02-10 DIAGNOSIS — F1220 Cannabis dependence, uncomplicated: Secondary | ICD-10-CM | POA: Diagnosis not present

## 2017-02-10 DIAGNOSIS — E872 Acidosis: Secondary | ICD-10-CM | POA: Diagnosis not present

## 2017-02-10 DIAGNOSIS — F419 Anxiety disorder, unspecified: Secondary | ICD-10-CM

## 2017-02-10 MED ORDER — TOPIRAMATE 50 MG PO TABS: 50.0000 mg | ORAL_TABLET | Freq: Two times a day (BID) | ORAL | 1 refills | Status: DC
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
## 2017-02-12 ENCOUNTER — Encounter (HOSPITAL_COMMUNITY): Payer: Self-pay | Admitting: Psychology

## 2017-02-12 ENCOUNTER — Other Ambulatory Visit (HOSPITAL_COMMUNITY): Payer: Medicare HMO | Admitting: Psychology

## 2017-02-12 DIAGNOSIS — F4312 Post-traumatic stress disorder, chronic: Secondary | ICD-10-CM

## 2017-02-12 DIAGNOSIS — Z6372 Alcoholism and drug addiction in family: Secondary | ICD-10-CM | POA: Diagnosis not present

## 2017-02-12 DIAGNOSIS — F10239 Alcohol dependence with withdrawal, unspecified: Secondary | ICD-10-CM | POA: Diagnosis not present

## 2017-02-12 DIAGNOSIS — E872 Acidosis: Secondary | ICD-10-CM | POA: Diagnosis not present

## 2017-02-12 DIAGNOSIS — F1020 Alcohol dependence, uncomplicated: Secondary | ICD-10-CM

## 2017-02-12 DIAGNOSIS — K7030 Alcoholic cirrhosis of liver without ascites: Secondary | ICD-10-CM | POA: Diagnosis not present

## 2017-02-12 DIAGNOSIS — F1220 Cannabis dependence, uncomplicated: Secondary | ICD-10-CM | POA: Diagnosis not present
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
## 2017-02-12 NOTE — Progress Notes (Signed)
    Daily Group Progress Note  Program: CD-IOP   02/12/2017 Susan Francis [id number]  Diagnosis:  Alcohol use disorder, severe, dependence ([place])  Chronic post-traumatic stress disorder (PTSD)   Sobriety Date: 6/6  Group Time: 1-2:30  Participation Level: Active  Behavioral Response: Appropriate and Sharing  Type of Therapy: Process Group  Interventions: CBT and DBT  Topic: Patients were active and engaged in group session. One member returned to the group after a week in the hospital for heart problems. The group discussed their individual recovery plans and any "shining moments" and "speedbumps" that impacted their recovery. UDS results were returned to some pts.      Group Time: 2:30-4  Participation Level: Active  Behavioral Response: Appropriate and Sharing  Type of Therapy: Psycho-education Group  Interventions: DBT and Solution Focused  Topic: Patients were active and engaged for psyschoeducation session. Counselors instructed pts on DBT skills for distress tolerance and changing their brain chemistry during active cravings. Pts responded positively and identified specific ways to implement the skills into their lives. Counselors then led a activity about keeping a "daily self-inventory".    Summary: Patient was active and engaged in session. She appeared more upbeat and cheerful than Monday. She reported she attended 1 AA meeting since last group. She reported her son called her spontaneously last night "just to check in" which is very out of character for him since he is very untrusting and angry with pt. Pt stated he was open and sharing about a recent job change and was encouraging of her recovery which made her feel hopeful. Counselor discussed DBT skill of "radical acceptance" in her caretaking position with her husband. Pt responded positively. She admitted she is still having cravings to drink but forced herself to go to an [place] and she felt much  better.    UDS collected: No Results: Positive for Marijuana  AA/NA attended?: YesMonday  Sponsor?: Yes   Youlanda Roys, LPCA 02/12/2017 6:07 PM

## 2017-02-13 ENCOUNTER — Encounter (HOSPITAL_COMMUNITY): Payer: Self-pay | Admitting: Psychology

## 2017-02-13 ENCOUNTER — Other Ambulatory Visit (HOSPITAL_COMMUNITY): Payer: Medicare HMO | Admitting: Psychology

## 2017-02-13 DIAGNOSIS — K7030 Alcoholic cirrhosis of liver without ascites: Secondary | ICD-10-CM | POA: Diagnosis not present

## 2017-02-13 DIAGNOSIS — E872 Acidosis: Secondary | ICD-10-CM | POA: Diagnosis not present

## 2017-02-13 DIAGNOSIS — F10239 Alcohol dependence with withdrawal, unspecified: Secondary | ICD-10-CM | POA: Diagnosis not present

## 2017-02-13 DIAGNOSIS — F1220 Cannabis dependence, uncomplicated: Secondary | ICD-10-CM

## 2017-02-13 DIAGNOSIS — Z6372 Alcoholism and drug addiction in family: Secondary | ICD-10-CM | POA: Diagnosis not present

## 2017-02-13 DIAGNOSIS — F4312 Post-traumatic stress disorder, chronic: Secondary | ICD-10-CM | POA: Diagnosis not present

## 2017-02-13 DIAGNOSIS — F1020 Alcohol dependence, uncomplicated: Secondary | ICD-10-CM

## 2017-02-14 ENCOUNTER — Encounter (HOSPITAL_COMMUNITY): Payer: Self-pay | Admitting: Psychology
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
## 2017-02-17 ENCOUNTER — Encounter: Payer: Self-pay | Admitting: Medical

## 2017-02-17 ENCOUNTER — Telehealth (HOSPITAL_COMMUNITY): Payer: Self-pay | Admitting: Psychology

## 2017-02-17 ENCOUNTER — Other Ambulatory Visit (HOSPITAL_COMMUNITY): Payer: Medicare HMO | Admitting: Medical

## 2017-02-17 ENCOUNTER — Encounter (HOSPITAL_COMMUNITY): Payer: Self-pay | Admitting: Psychology

## 2017-02-17 DIAGNOSIS — F329 Major depressive disorder, single episode, unspecified: Secondary | ICD-10-CM

## 2017-02-17 DIAGNOSIS — K7030 Alcoholic cirrhosis of liver without ascites: Secondary | ICD-10-CM | POA: Diagnosis not present

## 2017-02-17 DIAGNOSIS — E872 Acidosis: Secondary | ICD-10-CM | POA: Diagnosis not present

## 2017-02-17 DIAGNOSIS — F32A Depression, unspecified: Secondary | ICD-10-CM

## 2017-02-17 DIAGNOSIS — F4312 Post-traumatic stress disorder, chronic: Secondary | ICD-10-CM | POA: Diagnosis not present

## 2017-02-17 DIAGNOSIS — F1220 Cannabis dependence, uncomplicated: Secondary | ICD-10-CM | POA: Diagnosis not present

## 2017-02-17 DIAGNOSIS — F1020 Alcohol dependence, uncomplicated: Secondary | ICD-10-CM | POA: Diagnosis not present

## 2017-02-17 DIAGNOSIS — F419 Anxiety disorder, unspecified: Secondary | ICD-10-CM

## 2017-02-17 DIAGNOSIS — Z6372 Alcoholism and drug addiction in family: Secondary | ICD-10-CM | POA: Diagnosis not present

## 2017-02-17 DIAGNOSIS — T50905A Adverse effect of unspecified drugs, medicaments and biological substances, initial encounter: Secondary | ICD-10-CM

## 2017-02-17 DIAGNOSIS — F10239 Alcohol dependence with withdrawal, unspecified: Secondary | ICD-10-CM | POA: Diagnosis not present
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
## 2017-02-18 ENCOUNTER — Encounter (HOSPITAL_COMMUNITY): Payer: Self-pay | Admitting: Psychology
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
## 2017-02-19 ENCOUNTER — Encounter (HOSPITAL_COMMUNITY): Payer: Self-pay | Admitting: Medical

## 2017-02-19 ENCOUNTER — Other Ambulatory Visit (HOSPITAL_COMMUNITY): Payer: Medicare HMO | Admitting: Psychology

## 2017-02-19 DIAGNOSIS — Z6372 Alcoholism and drug addiction in family: Secondary | ICD-10-CM | POA: Diagnosis not present

## 2017-02-19 DIAGNOSIS — F1220 Cannabis dependence, uncomplicated: Secondary | ICD-10-CM | POA: Diagnosis not present

## 2017-02-19 DIAGNOSIS — K7030 Alcoholic cirrhosis of liver without ascites: Secondary | ICD-10-CM | POA: Diagnosis not present

## 2017-02-19 DIAGNOSIS — T50905A Adverse effect of unspecified drugs, medicaments and biological substances, initial encounter: Secondary | ICD-10-CM

## 2017-02-19 DIAGNOSIS — F4312 Post-traumatic stress disorder, chronic: Secondary | ICD-10-CM | POA: Diagnosis not present

## 2017-02-19 DIAGNOSIS — F10239 Alcohol dependence with withdrawal, unspecified: Secondary | ICD-10-CM | POA: Diagnosis not present

## 2017-02-19 DIAGNOSIS — E872 Acidosis: Secondary | ICD-10-CM | POA: Diagnosis not present

## 2017-02-19 DIAGNOSIS — F1020 Alcohol dependence, uncomplicated: Secondary | ICD-10-CM

## 2017-02-19 NOTE — Progress Notes (Signed)
Patient ID: [name], female   DOB: 17-Jun-1956, 61 y.o.   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 O-Alert Oriented Appropriate Comprehnsible

## 2017-02-20 ENCOUNTER — Other Ambulatory Visit (HOSPITAL_COMMUNITY): Payer: Medicare HMO | Admitting: Psychology

## 2017-02-20 DIAGNOSIS — Z6372 Alcoholism and drug addiction in family: Secondary | ICD-10-CM | POA: Diagnosis not present

## 2017-02-20 DIAGNOSIS — F4312 Post-traumatic stress disorder, chronic: Secondary | ICD-10-CM | POA: Diagnosis not present

## 2017-02-20 DIAGNOSIS — K7030 Alcoholic cirrhosis of liver without ascites: Secondary | ICD-10-CM | POA: Diagnosis not present

## 2017-02-20 DIAGNOSIS — F1020 Alcohol dependence, uncomplicated: Secondary | ICD-10-CM

## 2017-02-20 DIAGNOSIS — F32A Depression, unspecified: Secondary | ICD-10-CM

## 2017-02-20 DIAGNOSIS — F329 Major depressive disorder, single episode, unspecified: Secondary | ICD-10-CM

## 2017-02-20 DIAGNOSIS — F1220 Cannabis dependence, uncomplicated: Secondary | ICD-10-CM | POA: Diagnosis not present

## 2017-02-20 DIAGNOSIS — E872 Acidosis: Secondary | ICD-10-CM | POA: Diagnosis not present

## 2017-02-20 DIAGNOSIS — F419 Anxiety disorder, unspecified: Secondary | ICD-10-CM

## 2017-02-20 DIAGNOSIS — F10239 Alcohol dependence with withdrawal, unspecified: Secondary | ICD-10-CM | POA: Diagnosis not present
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
## 2017-02-21 ENCOUNTER — Encounter (HOSPITAL_COMMUNITY): Payer: Self-pay | Admitting: Psychology
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
## 2017-02-24 ENCOUNTER — Other Ambulatory Visit (HOSPITAL_COMMUNITY): Payer: Medicare HMO | Admitting: Psychology

## 2017-02-24 ENCOUNTER — Encounter (HOSPITAL_COMMUNITY): Payer: Self-pay | Admitting: Medical

## 2017-02-24 ENCOUNTER — Encounter: Payer: Self-pay | Admitting: Medical

## 2017-02-24 DIAGNOSIS — T7491XS Unspecified adult maltreatment, confirmed, sequela: Secondary | ICD-10-CM

## 2017-02-24 DIAGNOSIS — Z6372 Alcoholism and drug addiction in family: Secondary | ICD-10-CM

## 2017-02-24 DIAGNOSIS — K7030 Alcoholic cirrhosis of liver without ascites: Secondary | ICD-10-CM

## 2017-02-24 DIAGNOSIS — F32A Depression, unspecified: Secondary | ICD-10-CM

## 2017-02-24 DIAGNOSIS — F4312 Post-traumatic stress disorder, chronic: Secondary | ICD-10-CM

## 2017-02-24 DIAGNOSIS — F1020 Alcohol dependence, uncomplicated: Secondary | ICD-10-CM

## 2017-02-24 DIAGNOSIS — E872 Acidosis: Secondary | ICD-10-CM | POA: Diagnosis not present

## 2017-02-24 DIAGNOSIS — F329 Major depressive disorder, single episode, unspecified: Secondary | ICD-10-CM

## 2017-02-24 DIAGNOSIS — Z8619 Personal history of other infectious and parasitic diseases: Secondary | ICD-10-CM

## 2017-02-24 DIAGNOSIS — F10239 Alcohol dependence with withdrawal, unspecified: Secondary | ICD-10-CM | POA: Diagnosis not present

## 2017-02-24 DIAGNOSIS — F19982 Other psychoactive substance use, unspecified with psychoactive substance-induced sleep disorder: Secondary | ICD-10-CM

## 2017-02-24 DIAGNOSIS — F1220 Cannabis dependence, uncomplicated: Secondary | ICD-10-CM | POA: Diagnosis not present

## 2017-02-24 DIAGNOSIS — T7492XS Unspecified child maltreatment, confirmed, sequela: Secondary | ICD-10-CM

## 2017-02-24 DIAGNOSIS — F419 Anxiety disorder, unspecified: Secondary | ICD-10-CM

## 2017-02-24 NOTE — Progress Notes (Signed)
Patient ID: [name], female   DOB: 1956-07-14, 61 y.o.   MRN: [id number] Pt/Counselors received request for pt to return to work on non clinic days-Paperwork done

## 2017-02-26 ENCOUNTER — Other Ambulatory Visit (HOSPITAL_COMMUNITY): Payer: Medicare HMO | Admitting: Psychology

## 2017-02-26 DIAGNOSIS — E872 Acidosis: Secondary | ICD-10-CM | POA: Diagnosis not present

## 2017-02-26 DIAGNOSIS — F1220 Cannabis dependence, uncomplicated: Secondary | ICD-10-CM | POA: Diagnosis not present

## 2017-02-26 DIAGNOSIS — F4312 Post-traumatic stress disorder, chronic: Secondary | ICD-10-CM

## 2017-02-26 DIAGNOSIS — K7030 Alcoholic cirrhosis of liver without ascites: Secondary | ICD-10-CM | POA: Diagnosis not present

## 2017-02-26 DIAGNOSIS — F419 Anxiety disorder, unspecified: Secondary | ICD-10-CM

## 2017-02-26 DIAGNOSIS — F10239 Alcohol dependence with withdrawal, unspecified: Secondary | ICD-10-CM | POA: Diagnosis not present

## 2017-02-26 DIAGNOSIS — F1020 Alcohol dependence, uncomplicated: Secondary | ICD-10-CM

## 2017-02-26 DIAGNOSIS — F329 Major depressive disorder, single episode, unspecified: Secondary | ICD-10-CM

## 2017-02-26 DIAGNOSIS — F32A Depression, unspecified: Secondary | ICD-10-CM

## 2017-02-26 DIAGNOSIS — Z6372 Alcoholism and drug addiction in family: Secondary | ICD-10-CM | POA: Diagnosis not present
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
## 2017-02-27 ENCOUNTER — Other Ambulatory Visit (HOSPITAL_COMMUNITY): Payer: Medicare HMO | Admitting: Psychology

## 2017-02-27 ENCOUNTER — Encounter (HOSPITAL_COMMUNITY): Payer: Self-pay | Admitting: Psychology

## 2017-02-27 DIAGNOSIS — K7030 Alcoholic cirrhosis of liver without ascites: Secondary | ICD-10-CM

## 2017-02-27 DIAGNOSIS — E872 Acidosis: Secondary | ICD-10-CM | POA: Diagnosis not present

## 2017-02-27 DIAGNOSIS — F32A Depression, unspecified: Secondary | ICD-10-CM

## 2017-02-27 DIAGNOSIS — F4312 Post-traumatic stress disorder, chronic: Secondary | ICD-10-CM | POA: Diagnosis not present

## 2017-02-27 DIAGNOSIS — F1220 Cannabis dependence, uncomplicated: Secondary | ICD-10-CM | POA: Diagnosis not present

## 2017-02-27 DIAGNOSIS — F329 Major depressive disorder, single episode, unspecified: Secondary | ICD-10-CM

## 2017-02-27 DIAGNOSIS — F10239 Alcohol dependence with withdrawal, unspecified: Secondary | ICD-10-CM | POA: Diagnosis not present

## 2017-02-27 DIAGNOSIS — F1020 Alcohol dependence, uncomplicated: Secondary | ICD-10-CM

## 2017-02-27 DIAGNOSIS — Z6372 Alcoholism and drug addiction in family: Secondary | ICD-10-CM | POA: Diagnosis not present

## 2017-02-27 NOTE — Progress Notes (Signed)
Daily Group Progress Note  Program: CD-IOP   02/27/2017 Susan Francis [id number]  Diagnosis:  Alcohol use disorder, severe, dependence ([facility])  Cannabis dependence, continuous abuse ([facility])  Chronic post-traumatic stress disorder (PTSD)  Chronic depression  Chronic anxiety   Sobriety Date: 6/6  Group Time: 1-2:30  Participation Level: Active  Behavioral Response: Appropriate and Sharing  Type of Therapy: Process Group  Interventions: CBT, Solution Focused and Supportive  Topic: Patients were active and engaged in group check in, discussing their recovery and coping skills they are using to avoid relapse. One pt was absent from group due to another medical appointment. UDS were collected from some pts. Some pts met with Charles Kober to discuss medications management. One member completed successfully and her husband was present for final 15 min of group.     Group Time: 2:30-4  Participation Level: Active  Behavioral Response: Appropriate and Sharing  Type of Therapy: Psycho-education Group  Interventions: Family Systems  Topic: Patients were active and engaged in group psychoed. Counselors led pts through a brief explanation of Erikson's child development model. Pts were asked questions about their childhood homes, lack of emotional support, and how their nurture impacted their proneness to addiction. Patients spoke openly to each other.    Summary: Pt was active and engaged in session. She reported she had not attended any AA meetings since last group. She reported she had a "meltdown in group on Monday" but then later identified the experience as a "breakthrough". Pt said that she had not realized how much anger and pain she had stored up over the years. She reported she slept great on Monday night and that she was able to read a whole 300 pg book on Tuesday which she has not done in years. She was happy to report her car is not in need of any expensive  maintenance and she is feeling hopeful about returning to work soon. She scheduled a meeting with her boss to figure out a return to work date. Pt said her daily meditations are increasing in length and she is seeing the benefits. She was open and discussed the psychoeducation topic in detail.    UDS collected: Yes Results: negative  AA/NA attended?: No  Sponsor?: Yes   Wes Swan, LPCA, LCASA 02/27/2017 11:31 AM 

## 2017-03-03 ENCOUNTER — Other Ambulatory Visit (HOSPITAL_COMMUNITY): Payer: Medicare HMO | Attending: Psychiatry

## 2017-03-03 ENCOUNTER — Telehealth (HOSPITAL_COMMUNITY): Payer: Self-pay | Admitting: Psychology

## 2017-03-03 ENCOUNTER — Encounter (HOSPITAL_COMMUNITY): Payer: Self-pay | Admitting: Psychology

## 2017-03-03 DIAGNOSIS — F1020 Alcohol dependence, uncomplicated: Secondary | ICD-10-CM | POA: Insufficient documentation

## 2017-03-03 DIAGNOSIS — Z6372 Alcoholism and drug addiction in family: Secondary | ICD-10-CM | POA: Insufficient documentation

## 2017-03-03 DIAGNOSIS — F1220 Cannabis dependence, uncomplicated: Secondary | ICD-10-CM | POA: Insufficient documentation

## 2017-03-03 DIAGNOSIS — F329 Major depressive disorder, single episode, unspecified: Secondary | ICD-10-CM | POA: Insufficient documentation

## 2017-03-03 DIAGNOSIS — F419 Anxiety disorder, unspecified: Secondary | ICD-10-CM | POA: Insufficient documentation

## 2017-03-03 DIAGNOSIS — F4312 Post-traumatic stress disorder, chronic: Secondary | ICD-10-CM | POA: Insufficient documentation
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
## 2017-03-04 ENCOUNTER — Telehealth (HOSPITAL_COMMUNITY): Payer: Self-pay | Admitting: Psychology

## 2017-03-06 ENCOUNTER — Other Ambulatory Visit (HOSPITAL_COMMUNITY): Payer: Medicare HMO

## 2017-03-06 ENCOUNTER — Telehealth (HOSPITAL_COMMUNITY): Payer: Self-pay | Admitting: Psychology

## 2017-03-07 ENCOUNTER — Telehealth (HOSPITAL_COMMUNITY): Payer: Self-pay | Admitting: Psychology

## 2017-03-10 ENCOUNTER — Other Ambulatory Visit (HOSPITAL_COMMUNITY): Payer: Medicare HMO

## 2017-03-12 ENCOUNTER — Encounter (HOSPITAL_COMMUNITY): Payer: Self-pay | Admitting: Psychology

## 2017-03-12 ENCOUNTER — Other Ambulatory Visit (HOSPITAL_BASED_OUTPATIENT_CLINIC_OR_DEPARTMENT_OTHER): Payer: Medicare HMO | Admitting: Medical

## 2017-03-12 ENCOUNTER — Encounter (HOSPITAL_COMMUNITY): Payer: Self-pay | Admitting: Medical

## 2017-03-12 DIAGNOSIS — K7030 Alcoholic cirrhosis of liver without ascites: Secondary | ICD-10-CM

## 2017-03-12 DIAGNOSIS — Z6372 Alcoholism and drug addiction in family: Secondary | ICD-10-CM

## 2017-03-12 DIAGNOSIS — Z8619 Personal history of other infectious and parasitic diseases: Secondary | ICD-10-CM | POA: Diagnosis not present

## 2017-03-12 DIAGNOSIS — F1020 Alcohol dependence, uncomplicated: Secondary | ICD-10-CM | POA: Diagnosis not present

## 2017-03-12 DIAGNOSIS — Z9111 Patient's noncompliance with dietary regimen: Secondary | ICD-10-CM

## 2017-03-12 DIAGNOSIS — F329 Major depressive disorder, single episode, unspecified: Secondary | ICD-10-CM

## 2017-03-12 DIAGNOSIS — F4312 Post-traumatic stress disorder, chronic: Secondary | ICD-10-CM | POA: Diagnosis not present

## 2017-03-12 DIAGNOSIS — F419 Anxiety disorder, unspecified: Secondary | ICD-10-CM

## 2017-03-12 DIAGNOSIS — T7491XS Unspecified adult maltreatment, confirmed, sequela: Secondary | ICD-10-CM | POA: Diagnosis not present

## 2017-03-12 DIAGNOSIS — R569 Unspecified convulsions: Secondary | ICD-10-CM

## 2017-03-12 DIAGNOSIS — F1220 Cannabis dependence, uncomplicated: Secondary | ICD-10-CM

## 2017-03-12 NOTE — Progress Notes (Signed)
Susan Francis is a 61 y.o. female patient. CD-IOP: Discharge, unsuccessful. The patient is being discharged from the CD-IOP today. She has missed three consecutive group sessions and we have not heard from her. repeated phone calls to the patient and voice mail messages left for her as well as her daughter have not been returned. Discharge, effective today.         [name], LCAS
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
## 2017-03-13 ENCOUNTER — Other Ambulatory Visit (HOSPITAL_COMMUNITY): Payer: Medicare HMO

## 2017-03-17 ENCOUNTER — Other Ambulatory Visit (HOSPITAL_COMMUNITY): Payer: Medicare HMO

## 2017-03-19 ENCOUNTER — Other Ambulatory Visit (HOSPITAL_COMMUNITY): Payer: Medicare HMO

## 2017-03-20 ENCOUNTER — Other Ambulatory Visit (HOSPITAL_COMMUNITY): Payer: Medicare HMO

## 2017-03-24 ENCOUNTER — Other Ambulatory Visit (HOSPITAL_COMMUNITY): Payer: Medicare HMO

## 2017-03-24 ENCOUNTER — Ambulatory Visit: Payer: Self-pay | Admitting: Neurology

## 2017-03-24 DIAGNOSIS — Z029 Encounter for administrative examinations, unspecified: Secondary | ICD-10-CM

## 2017-03-26 ENCOUNTER — Other Ambulatory Visit (HOSPITAL_COMMUNITY): Payer: Medicare HMO

## 2017-03-27 ENCOUNTER — Other Ambulatory Visit (HOSPITAL_COMMUNITY): Payer: Medicare HMO

## 2017-03-28 ENCOUNTER — Encounter: Payer: Self-pay | Admitting: Neurology

## 2017-03-31 ENCOUNTER — Other Ambulatory Visit (HOSPITAL_COMMUNITY): Payer: Medicare HMO

## 2017-04-02 ENCOUNTER — Other Ambulatory Visit (HOSPITAL_COMMUNITY): Payer: Medicare HMO

## 2017-04-03 ENCOUNTER — Other Ambulatory Visit (HOSPITAL_COMMUNITY): Payer: Medicare HMO

## 2017-04-07 ENCOUNTER — Other Ambulatory Visit (HOSPITAL_COMMUNITY): Payer: Medicare HMO

## 2017-04-08 ENCOUNTER — Encounter (HOSPITAL_COMMUNITY): Payer: Self-pay

## 2017-04-08 ENCOUNTER — Emergency Department (HOSPITAL_COMMUNITY): Payer: Medicare HMO

## 2017-04-08 ENCOUNTER — Inpatient Hospital Stay (HOSPITAL_COMMUNITY)
Admission: EM | Admit: 2017-04-08 | Discharge: 2017-04-11 | DRG: 897 | Disposition: A | Discharge status: Home / Self Care | Payer: Medicare HMO | Attending: Internal Medicine | Admitting: Internal Medicine

## 2017-04-08 DIAGNOSIS — I4581 Long QT syndrome: Secondary | ICD-10-CM | POA: Diagnosis present

## 2017-04-08 DIAGNOSIS — F32A Depression, unspecified: Secondary | ICD-10-CM | POA: Diagnosis present

## 2017-04-08 DIAGNOSIS — D696 Thrombocytopenia, unspecified: Secondary | ICD-10-CM | POA: Diagnosis not present

## 2017-04-08 DIAGNOSIS — G40909 Epilepsy, unspecified, not intractable, without status epilepticus: Secondary | ICD-10-CM

## 2017-04-08 DIAGNOSIS — K219 Gastro-esophageal reflux disease without esophagitis: Secondary | ICD-10-CM | POA: Diagnosis not present

## 2017-04-08 DIAGNOSIS — E039 Hypothyroidism, unspecified: Secondary | ICD-10-CM | POA: Diagnosis not present

## 2017-04-08 DIAGNOSIS — F10231 Alcohol dependence with withdrawal delirium: Principal | ICD-10-CM | POA: Diagnosis present

## 2017-04-08 DIAGNOSIS — Z885 Allergy status to narcotic agent status: Secondary | ICD-10-CM | POA: Diagnosis not present

## 2017-04-08 DIAGNOSIS — F1020 Alcohol dependence, uncomplicated: Secondary | ICD-10-CM | POA: Diagnosis not present

## 2017-04-08 DIAGNOSIS — Z86718 Personal history of other venous thrombosis and embolism: Secondary | ICD-10-CM | POA: Diagnosis not present

## 2017-04-08 DIAGNOSIS — F17210 Nicotine dependence, cigarettes, uncomplicated: Secondary | ICD-10-CM | POA: Diagnosis not present

## 2017-04-08 DIAGNOSIS — Z888 Allergy status to other drugs, medicaments and biological substances status: Secondary | ICD-10-CM | POA: Diagnosis not present

## 2017-04-08 DIAGNOSIS — E872 Acidosis: Secondary | ICD-10-CM | POA: Diagnosis not present

## 2017-04-08 DIAGNOSIS — K7030 Alcoholic cirrhosis of liver without ascites: Secondary | ICD-10-CM | POA: Diagnosis not present

## 2017-04-08 DIAGNOSIS — Z8619 Personal history of other infectious and parasitic diseases: Secondary | ICD-10-CM | POA: Diagnosis not present

## 2017-04-08 DIAGNOSIS — F10239 Alcohol dependence with withdrawal, unspecified: Secondary | ICD-10-CM

## 2017-04-08 DIAGNOSIS — F329 Major depressive disorder, single episode, unspecified: Secondary | ICD-10-CM | POA: Diagnosis present

## 2017-04-08 DIAGNOSIS — Z880 Allergy status to penicillin: Secondary | ICD-10-CM

## 2017-04-08 DIAGNOSIS — R0602 Shortness of breath: Secondary | ICD-10-CM | POA: Diagnosis not present

## 2017-04-08 DIAGNOSIS — F10931 Alcohol use, unspecified with withdrawal delirium: Secondary | ICD-10-CM | POA: Insufficient documentation

## 2017-04-08 DIAGNOSIS — F10939 Alcohol use, unspecified with withdrawal, unspecified: Secondary | ICD-10-CM

## 2017-04-08 DIAGNOSIS — R569 Unspecified convulsions: Secondary | ICD-10-CM | POA: Diagnosis not present

## 2017-04-08 DIAGNOSIS — E8729 Other acidosis: Secondary | ICD-10-CM | POA: Diagnosis present

## 2017-04-08 HISTORY — DX: Alcohol use, unspecified with withdrawal delirium: F10.931

## 2017-04-08 HISTORY — DX: Alcohol dependence with withdrawal delirium: F10.231

## 2017-04-08 LAB — COMPREHENSIVE METABOLIC PANEL
ALBUMIN: 4.6 g/dL (ref 3.5–5.0)
ALT: 29 U/L (ref 14–54)
ANION GAP: 22 — AB (ref 5–15)
AST: 54 U/L — ABNORMAL HIGH (ref 15–41)
Alkaline Phosphatase: 79 U/L (ref 38–126)
BUN: 11 mg/dL (ref 6–20)
CHLORIDE: 101 mmol/L (ref 101–111)
CO2: 16 mmol/L — AB (ref 22–32)
Calcium: 8.5 mg/dL — ABNORMAL LOW (ref 8.9–10.3)
Creatinine, Ser: 0.87 mg/dL (ref 0.44–1.00)
GFR calc non Af Amer: >60 mL/min (ref >60)
GLUCOSE: 120 mg/dL — AB (ref 65–99)
Potassium: 4.4 mmol/L (ref 3.5–5.1)
SODIUM: 139 mmol/L (ref 135–145)
TOTAL PROTEIN: 7.1 g/dL (ref 6.5–8.1)
Total Bilirubin: 1.7 mg/dL — ABNORMAL HIGH (ref 0.3–1.2)

## 2017-04-08 LAB — RAPID URINE DRUG SCREEN, HOSP PERFORMED
AMPHETAMINES: NOT DETECTED
Barbiturates: NOT DETECTED
Benzodiazepines: NOT DETECTED
Cocaine: NOT DETECTED
Opiates: NOT DETECTED
Tetrahydrocannabinol: POSITIVE — AB

## 2017-04-08 LAB — ACETAMINOPHEN LEVEL

## 2017-04-08 LAB — CBC WITH DIFFERENTIAL/PLATELET
BASOS ABS: 0 10*3/uL (ref 0.0–0.1)
Basophils Relative: 0 %
Eosinophils Absolute: 0 10*3/uL (ref 0.0–0.7)
Eosinophils Relative: 0 %
HCT: 47.8 % — ABNORMAL HIGH (ref 36.0–46.0)
HEMOGLOBIN: 17 g/dL — AB (ref 12.0–15.0)
Lymphocytes Relative: 15 %
Lymphs Abs: 1.6 10*3/uL (ref 0.7–4.0)
MCH: 33.3 pg (ref 26.0–34.0)
MCHC: 35.6 g/dL (ref 30.0–36.0)
MCV: 93.5 fL (ref 78.0–100.0)
MONO ABS: 0.7 10*3/uL (ref 0.1–1.0)
MONOS PCT: 7 %
NEUTROS ABS: 8.2 10*3/uL — AB (ref 1.7–7.7)
Neutrophils Relative %: 78 %
PLATELETS: 105 10*3/uL — AB (ref 150–400)
RBC: 5.11 MIL/uL (ref 3.87–5.11)
RDW: 14.1 % (ref 11.5–15.5)
WBC: 10.5 10*3/uL (ref 4.0–10.5)

## 2017-04-08 LAB — ETHANOL

## 2017-04-08 LAB — AMMONIA: AMMONIA: 38 umol/L — AB (ref 9–35)

## 2017-04-08 LAB — LIPASE, BLOOD: LIPASE: 29 U/L (ref 11–51)

## 2017-04-08 LAB — MRSA PCR SCREENING: MRSA BY PCR: NEGATIVE

## 2017-04-08 LAB — SALICYLATE LEVEL: Salicylate Lvl: <7 mg/dL (ref 2.8–30.0)

## 2017-04-08 MED ORDER — CITALOPRAM HYDROBROMIDE 20 MG PO TABS
40.0000 mg | ORAL_TABLET | Freq: Every day | ORAL | Status: DC
  Administered 2017-04-08 – 2017-04-09 (×2): 40 mg via ORAL
  Filled 2017-04-08 (×2): qty 2

## 2017-04-08 MED ORDER — LORAZEPAM 2 MG/ML IJ SOLN
2.0000 mg | Freq: Once | INTRAMUSCULAR | Status: AC
  Administered 2017-04-08: 2 mg via INTRAVENOUS
  Filled 2017-04-08: qty 1

## 2017-04-08 MED ORDER — LORAZEPAM 2 MG/ML IJ SOLN
0.0000 mg | Freq: Two times a day (BID) | INTRAMUSCULAR | Status: DC
  Filled 2017-04-08: qty 1

## 2017-04-08 MED ORDER — SODIUM CHLORIDE 0.9 % IV BOLUS (SEPSIS)
1000.0000 mL | Freq: Once | INTRAVENOUS | Status: AC
  Administered 2017-04-08: 1000 mL via INTRAVENOUS

## 2017-04-08 MED ORDER — LORAZEPAM 1 MG PO TABS
0.0000 mg | ORAL_TABLET | Freq: Four times a day (QID) | ORAL | Status: AC
  Administered 2017-04-08: 1 mg via ORAL
  Filled 2017-04-08: qty 1

## 2017-04-08 MED ORDER — POLYETHYLENE GLYCOL 3350 17 G PO PACK: 17.0000 g | PACK | Freq: Every day | ORAL | Status: DC | PRN

## 2017-04-08 MED ORDER — ONDANSETRON HCL 4 MG/2ML IJ SOLN
4.0000 mg | Freq: Once | INTRAMUSCULAR | Status: AC | PRN
  Administered 2017-04-08: 4 mg via INTRAVENOUS
  Filled 2017-04-08: qty 2

## 2017-04-08 MED ORDER — TOPIRAMATE 25 MG PO TABS
50.0000 mg | ORAL_TABLET | Freq: Two times a day (BID) | ORAL | Status: DC
  Administered 2017-04-08 – 2017-04-11 (×7): 50 mg via ORAL
  Filled 2017-04-08 (×7): qty 2

## 2017-04-08 MED ORDER — LORAZEPAM 2 MG/ML IJ SOLN
0.0000 mg | Freq: Four times a day (QID) | INTRAMUSCULAR | Status: AC
  Administered 2017-04-08 – 2017-04-10 (×3): 2 mg via INTRAVENOUS
  Filled 2017-04-08 (×2): qty 1

## 2017-04-08 MED ORDER — ACETAMINOPHEN 650 MG RE SUPP: 650.0000 mg | Freq: Four times a day (QID) | RECTAL | Status: DC | PRN

## 2017-04-08 MED ORDER — ACETAMINOPHEN 325 MG PO TABS: 650.0000 mg | ORAL_TABLET | Freq: Four times a day (QID) | ORAL | Status: DC | PRN

## 2017-04-08 MED ORDER — SODIUM CHLORIDE 0.9 % IV SOLN
INTRAVENOUS | Status: DC
  Administered 2017-04-08 – 2017-04-09 (×2): via INTRAVENOUS

## 2017-04-08 MED ORDER — ADULT MULTIVITAMIN W/MINERALS CH
1.0000 | ORAL_TABLET | Freq: Every day | ORAL | Status: DC
  Administered 2017-04-08 – 2017-04-11 (×4): 1 via ORAL
  Filled 2017-04-08 (×4): qty 1

## 2017-04-08 MED ORDER — LORAZEPAM 1 MG PO TABS
0.0000 mg | ORAL_TABLET | Freq: Two times a day (BID) | ORAL | Status: DC
  Administered 2017-04-10: 2 mg via ORAL
  Filled 2017-04-08: qty 2
  Filled 2017-04-08: qty 1

## 2017-04-08 MED ORDER — SODIUM CHLORIDE 0.9 % IV SOLN
1000.0000 mg | Freq: Once | INTRAVENOUS | Status: AC
  Administered 2017-04-08: 1000 mg via INTRAVENOUS
  Filled 2017-04-08: qty 10

## 2017-04-08 MED ORDER — FOLIC ACID 1 MG PO TABS
1.0000 mg | ORAL_TABLET | Freq: Every day | ORAL | Status: DC
  Administered 2017-04-08 – 2017-04-11 (×4): 1 mg via ORAL
  Filled 2017-04-08 (×4): qty 1

## 2017-04-08 MED ORDER — LEVETIRACETAM 500 MG PO TABS
1000.0000 mg | ORAL_TABLET | Freq: Two times a day (BID) | ORAL | Status: DC
  Administered 2017-04-08 – 2017-04-11 (×7): 1000 mg via ORAL
  Filled 2017-04-08 (×7): qty 2

## 2017-04-08 MED ORDER — LORAZEPAM 2 MG/ML IJ SOLN: 1.0000 mg | Freq: Once | INTRAMUSCULAR | Status: DC

## 2017-04-08 MED ORDER — VITAMIN B-1 100 MG PO TABS
100.0000 mg | ORAL_TABLET | Freq: Every day | ORAL | Status: DC
  Administered 2017-04-08 – 2017-04-11 (×4): 100 mg via ORAL
  Filled 2017-04-08 (×5): qty 1

## 2017-04-08 MED ORDER — THIAMINE HCL 100 MG/ML IJ SOLN
100.0000 mg | Freq: Every day | INTRAMUSCULAR | Status: DC
  Filled 2017-04-08: qty 2
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
## 2017-04-08 NOTE — ED Notes (Signed)
Pt states her last drink was sometime last night, she doesn't think she had a seizure

## 2017-04-08 NOTE — ED Notes (Signed)
Patient denies pain and is resting comfortably.  

## 2017-04-08 NOTE — ED Notes (Signed)
Pts husband called EMS because she was having a seizure but didn't have any details about the seizure Pt complains of nausea and wants to go to sleep Pt is a heavy drinking and has been taking her neighbors oxycodone
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
## 2017-04-08 NOTE — ED Notes (Signed)
Pt states that she is here for alcohol poisoning, she denies any other substances Pt states that she has been drinking for several days and is dehydrated Pt is very nauseated but hasn't vomited

## 2017-04-09 ENCOUNTER — Other Ambulatory Visit (HOSPITAL_COMMUNITY): Payer: Medicare HMO

## 2017-04-09 DIAGNOSIS — D696 Thrombocytopenia, unspecified: Secondary | ICD-10-CM

## 2017-04-09 DIAGNOSIS — F10239 Alcohol dependence with withdrawal, unspecified: Secondary | ICD-10-CM

## 2017-04-09 DIAGNOSIS — E872 Acidosis: Secondary | ICD-10-CM

## 2017-04-09 DIAGNOSIS — E039 Hypothyroidism, unspecified: Secondary | ICD-10-CM

## 2017-04-09 DIAGNOSIS — F329 Major depressive disorder, single episode, unspecified: Secondary | ICD-10-CM

## 2017-04-09 DIAGNOSIS — F10939 Alcohol use, unspecified with withdrawal, unspecified: Secondary | ICD-10-CM

## 2017-04-09 DIAGNOSIS — F10231 Alcohol dependence with withdrawal delirium: Principal | ICD-10-CM

## 2017-04-09 DIAGNOSIS — K7030 Alcoholic cirrhosis of liver without ascites: Secondary | ICD-10-CM

## 2017-04-09 DIAGNOSIS — G40909 Epilepsy, unspecified, not intractable, without status epilepticus: Secondary | ICD-10-CM

## 2017-04-09 LAB — COMPREHENSIVE METABOLIC PANEL WITH GFR
ALT: 21 U/L (ref 14–54)
AST: 44 U/L — ABNORMAL HIGH (ref 15–41)
Albumin: 3.7 g/dL (ref 3.5–5.0)
Alkaline Phosphatase: 56 U/L (ref 38–126)
Anion gap: 8 (ref 5–15)
BUN: 7 mg/dL (ref 6–20)
CO2: 21 mmol/L — ABNORMAL LOW (ref 22–32)
Calcium: 7.9 mg/dL — ABNORMAL LOW (ref 8.9–10.3)
Chloride: 109 mmol/L (ref 101–111)
Creatinine, Ser: 0.76 mg/dL (ref 0.44–1.00)
GFR calc Af Amer: >60 mL/min
GFR calc non Af Amer: >60 mL/min
Glucose, Bld: 122 mg/dL — ABNORMAL HIGH (ref 65–99)
Potassium: 3.3 mmol/L — ABNORMAL LOW (ref 3.5–5.1)
Sodium: 138 mmol/L (ref 135–145)
Total Bilirubin: 1.8 mg/dL — ABNORMAL HIGH (ref 0.3–1.2)
Total Protein: 5.9 g/dL — ABNORMAL LOW (ref 6.5–8.1)

## 2017-04-09 LAB — CBC
HEMATOCRIT: 40.3 % (ref 36.0–46.0)
Hemoglobin: 14.2 g/dL (ref 12.0–15.0)
MCH: 32.3 pg (ref 26.0–34.0)
MCHC: 35.2 g/dL (ref 30.0–36.0)
MCV: 91.6 fL (ref 78.0–100.0)
Platelets: 51 10*3/uL — ABNORMAL LOW (ref 150–400)
RBC: 4.4 MIL/uL (ref 3.87–5.11)
RDW: 13.8 % (ref 11.5–15.5)
WBC: 4.5 10*3/uL (ref 4.0–10.5)

## 2017-04-09 LAB — HIV ANTIBODY (ROUTINE TESTING W REFLEX): HIV Screen 4th Generation wRfx: NONREACTIVE

## 2017-04-09 LAB — TSH: TSH: 6.536 u[IU]/mL — ABNORMAL HIGH (ref 0.350–4.500)

## 2017-04-09 LAB — MAGNESIUM: Magnesium: 2.3 mg/dL (ref 1.7–2.4)

## 2017-04-09 MED ORDER — POTASSIUM CHLORIDE CRYS ER 20 MEQ PO TBCR
40.0000 meq | EXTENDED_RELEASE_TABLET | Freq: Once | ORAL | Status: AC
  Administered 2017-04-09: 40 meq via ORAL
  Filled 2017-04-09: qty 2

## 2017-04-09 MED ORDER — CITALOPRAM HYDROBROMIDE 20 MG PO TABS
20.0000 mg | ORAL_TABLET | Freq: Every day | ORAL | Status: DC
  Administered 2017-04-10 – 2017-04-11 (×2): 20 mg via ORAL
  Filled 2017-04-09 (×2): qty 1

## 2017-04-09 NOTE — Care Management Note (Signed)
Case Management Note  Patient Details  Name: Susan Francis MRN: [id number] Date of Birth: 06-Feb-1956  Subjective/Objective:    ETOH withdrawal CIWA protcol                Action/Plan: Date:  April 09, 2017 Chart reviewed for concurrent status and case management needs. Will continue to follow patient progress. Discharge Planning: following for needs Expected discharge date: [id number] Velva Harman, BSN, [place], [place]  Expected Discharge Date:                  Expected Discharge Plan:  Home/Self Care  In-House Referral:  Clinical Social Work  Discharge planning Services  CM Consult  Post Acute Care Choice:    Choice offered to:     DME Arranged:    DME Agency:     HH Arranged:    [place] Agency:     Status of Service:  In process, will continue to follow  If discussed at Long Length of Stay Meetings, dates discussed:    Additional Comments:  Leeroy Cha, RN 04/09/2017, 8:23 AM

## 2017-04-09 NOTE — Progress Notes (Addendum)
Consult with pt during rounding.   Introduced spiritual care as resource.    Susan Francis recently in CD-IOP program with counselor [name].  Left CD-IOP in July without contact with counselor.  Ann attempted phone contact on several occasions for wellness check.   As this chaplain works with outpatient [facility] and is familiar with staff, Susan Francis wishes chaplain to contact Susan Francis and inform that she is in hospital.    Though encounter, Susan Francis was responsive and welcoming of chaplian presence.  Tearful when speaking of relationship with children.  She feels both embarrassed at relapse and also "disregarded" by family stating, "when this happens so many times, they just say - let me know when it's over."    Pt's son does not know pt is hospitalized.  He recently started new work in [place] and she does not want to worry him.  Spoke with chaplain about history of relapse and stated "the program works if you work it - I just get to a point where I think I can do it on my own."   States is interested in rehab, but reports barrier of providing care for husband and not wanting to leave him.  Chaplain encouraged pt speaking with social worker regarding care issues.    Susan Francis feels overwhelmed by care of husband.  Describes "each day as the same."  Also stated "this is the 5th family member I have been caregiver for."   [facility] Chaplain Susan Francis, MDiv

## 2017-04-09 NOTE — Progress Notes (Signed)
PROGRESS NOTE    Susan Francis  ZOX:[id number] DOB: 1955-12-01 DOA: 04/08/2017 PCP: Patient, No Pcp Per   Brief Narrative:  Patient is a 61 year old female history of ongoing chronic alcohol use, chronic alcohol withdrawal including DTs and seizures, seizure disorder, cirrhosis, thrombocytopenia, depression who presents to the ED with her with a seizure per husband. Patient had been on our callback for the past 4-5 days last drink was 2 days prior to admission due to the fact she couldn't drink due to ongoing nausea vomiting. Patient states she's been compliant with her medications except for the past 2 days. Patient does endorse a prior history of seizures in the setting of alcohol withdrawal. Patient currently stable. Continued alcohol withdrawal protocol   Assessment & Plan:   Principal Problem:   Acute, mixed level of activity, alcohol withdrawal delirium (HCC) Active Problems:   Seizure disorder (HCC)   Alcohol use disorder, severe, dependence (HCC)   Alcoholic ketoacidosis   Depression   Disorder involving thrombocytopenia (HCC)   Thyroid activity decreased   Alcoholic cirrhosis of liver without ascites ([place])  #1 complicated alcohol withdrawal/alcohol abuse Patient with some clinical improvement today. However states she does not feel like her usual self. Patient states tremors have improved. No seizures noted overnight. Continue the Ativan withdrawal protocol. Continue thiamine, folic acid, multivitamin. Gentle hydration. Alcohol cessation stressed to patient. Social worker consult.  #2 seizure disorder No further seizures noted. Patient was loaded with Keppra on presentation to the ED. Continue current home regimen of Keppra. Seizure precautions. Follow.  #3 alcoholic ketoacidosis Result with hydration. Follow.  #4 cirrhosis/history of hepatitis C Compensated. Patient with no prior history of GI bleed. Patient with no evidence of ascites or edema noted. Alcohol cessation.  Patient status post successful treatment of hepatitis C.  #5 thrombocytopenia Platelet count has decreased since admission. Patient with no overt bleeding. Monitor.  #6 history of QTC prolongation Repeat EKG with a QTC of 497. Patient chronically on Celexa. Due to age greater than 23 will decrease patient's Celexa dose to 20 mg daily. Monitor closely.  #7 depression Decrease patient's Celexa dose to 20 mg daily as patient with age greater than 86 and patient with history of QTC prolongation.  #8 hypothyroidism Check a TSH. Continue home dose Synthroid.  DVT prophylaxis: SCDs Code Status: Full Family Communication: Updated patient. No family at bedside. Disposition Plan: Transfer to telemetry.   Consultants:   None  Procedures:   Chest x-ray 04/08/2017  Antimicrobials:   None   Subjective: Patient states does not feel right in her head right now. Patient denies any chest pain. No shortness of breath. No nausea or emesis. No abdominal pain. Patient states improvement with tremors. Tolerating liquids. No seizures noted overnight. Patient denies any bleeding.  Objective: Vitals:   04/09/17 0500 04/09/17 0700 04/09/17 0711 04/09/17 0800  BP: 110/73 120/70  130/71  Pulse: 70 62  66  Resp: 19 (!) 21  19  Temp:   99 F (37.2 C)   TempSrc:   Oral   SpO2: 95% 94%  97%  Weight:      Height:        Intake/Output Summary (Last 24 hours) at 04/09/17 0943 Last data filed at 04/09/17 0909  Gross per 24 hour  Intake              120 ml  Output              950 ml  Net             -  830 ml   Filed Weights   04/08/17 1500  Weight: 70.8 kg (156 lb 1.4 oz)    Examination:  General exam: Appears calm and comfortable  Respiratory system: Clear to auscultation. Respiratory effort normal. Cardiovascular system: S1 & S2 heard, RRR. No JVD, murmurs, rubs, gallops or clicks. No pedal edema. Gastrointestinal system: Abdomen is nondistended, soft and nontender. No organomegaly or  masses felt. Normal bowel sounds heard. Central nervous system: Alert and oriented. No focal neurological deficits. Extremities: Symmetric 5 x 5 power. Skin: No rashes, lesions or ulcers Psychiatry: Judgement and insight appear fair. Mood & affect appropriate.     Data Reviewed: I have personally reviewed following labs and imaging studies  CBC:  Recent Labs Lab 04/08/17 0813 04/09/17 0307  WBC 10.5 4.5  NEUTROABS 8.2*  --   HGB 17.0* 14.2  HCT 47.8* 40.3  MCV 93.5 91.6  PLT 105* 51*   Basic Metabolic Panel:  Recent Labs Lab 04/08/17 0813 04/09/17 0307  NA 139 138  K 4.4 3.3*  CL 101 109  CO2 16* 21*  GLUCOSE 120* 122*  BUN 11 7  CREATININE 0.87 0.76  CALCIUM 8.5* 7.9*  MG  --  2.3   GFR: Estimated Creatinine Clearance: 64.8 mL/min (by C-G formula based on SCr of 0.76 mg/dL). Liver Function Tests:  Recent Labs Lab 04/08/17 0813 04/09/17 0307  AST 54* 44*  ALT 29 21  ALKPHOS 79 56  BILITOT 1.7* 1.8*  PROT 7.1 5.9*  ALBUMIN 4.6 3.7    Recent Labs Lab 04/08/17 0813  LIPASE 29    Recent Labs Lab 04/08/17 0813  AMMONIA 38*   Coagulation Profile: No results for input(s): INR, PROTIME in the last 168 hours. Cardiac Enzymes: No results for input(s): CKTOTAL, CKMB, CKMBINDEX, TROPONINI in the last 168 hours. BNP (last 3 results) No results for input(s): PROBNP in the last 8760 hours. HbA1C: No results for input(s): HGBA1C in the last 72 hours. CBG: No results for input(s): GLUCAP in the last 168 hours. Lipid Profile: No results for input(s): CHOL, HDL, LDLCALC, TRIG, CHOLHDL, LDLDIRECT in the last 72 hours. Thyroid Function Tests:  Recent Labs  04/09/17 0814  TSH 6.536*   Anemia Panel: No results for input(s): VITAMINB12, FOLATE, FERRITIN, TIBC, IRON, RETICCTPCT in the last 72 hours. Sepsis Labs: No results for input(s): PROCALCITON, LATICACIDVEN in the last 168 hours.  Recent Results (from the past 240 hour(s))  MRSA PCR Screening      Status: None   Collection Time: 04/08/17  3:16 PM  Result Value Ref Range Status   MRSA by PCR NEGATIVE NEGATIVE Final    Comment:        The GeneXpert MRSA Assay (FDA approved for NASAL specimens only), is one component of a comprehensive MRSA colonization surveillance program. It is not intended to diagnose MRSA infection nor to guide or monitor treatment for MRSA infections.          Radiology Studies: Dg Chest Port 1 View  Result Date: 04/08/2017 CLINICAL DATA:  Hepatic cirrhosis.  Short of breath.  Seizure. EXAM: PORTABLE CHEST 1 VIEW COMPARISON:  07/13/2014 FINDINGS: Anterior cervical fusion noted. Normal mediastinum and cardiac silhouette. Normal pulmonary vasculature. No evidence of effusion, infiltrate, or pneumothorax. No acute bony abnormality. IMPRESSION: No acute cardiopulmonary process. Electronically Signed   By: Suzy Bouchard M.D.   On: 04/08/2017 09:37        Scheduled Meds: . [START ON 04/10/2017] citalopram  20 mg Oral Daily  .  folic acid  1 mg Oral Daily  . levETIRAcetam  1,000 mg Oral BID  . LORazepam  0-4 mg Intravenous Q6H   Or  . LORazepam  0-4 mg Oral Q6H  . [START ON 04/10/2017] LORazepam  0-4 mg Intravenous Q12H   Or  . [START ON 04/10/2017] LORazepam  0-4 mg Oral Q12H  . LORazepam  1 mg Intravenous Once  . multivitamin with minerals  1 tablet Oral Daily  . potassium chloride  40 mEq Oral Once  . thiamine  100 mg Oral Daily   Or  . thiamine  100 mg Intravenous Daily  . topiramate  50 mg Oral BID   Continuous Infusions: . sodium chloride 100 mL/hr at 04/08/17 1500     LOS: 1 day    Time spent: 4 mins    Janai Maudlin, MD [facility] Pager [phone]  If 7PM-7AM, please contact night-coverage [email or web address] Password [email or web address] 04/09/2017, 9:43 AM

## 2017-04-10 ENCOUNTER — Other Ambulatory Visit (HOSPITAL_COMMUNITY): Payer: Medicare HMO

## 2017-04-10 DIAGNOSIS — F1020 Alcohol dependence, uncomplicated: Secondary | ICD-10-CM

## 2017-04-10 LAB — BASIC METABOLIC PANEL
ANION GAP: 5 (ref 5–15)
BUN: 7 mg/dL (ref 6–20)
CALCIUM: 8.2 mg/dL — AB (ref 8.9–10.3)
CO2: 24 mmol/L (ref 22–32)
Chloride: 111 mmol/L (ref 101–111)
Creatinine, Ser: 0.75 mg/dL (ref 0.44–1.00)
Glucose, Bld: 101 mg/dL — ABNORMAL HIGH (ref 65–99)
POTASSIUM: 3.6 mmol/L (ref 3.5–5.1)
Sodium: 140 mmol/L (ref 135–145)

## 2017-04-10 LAB — CBC WITH DIFFERENTIAL/PLATELET
BASOS ABS: 0 10*3/uL (ref 0.0–0.1)
BASOS PCT: 0 %
EOS ABS: 0 10*3/uL (ref 0.0–0.7)
Eosinophils Relative: 1 %
HCT: 43 % (ref 36.0–46.0)
HEMOGLOBIN: 14.9 g/dL (ref 12.0–15.0)
Lymphocytes Relative: 39 %
Lymphs Abs: 1.6 10*3/uL (ref 0.7–4.0)
MCH: 32.7 pg (ref 26.0–34.0)
MCHC: 34.7 g/dL (ref 30.0–36.0)
MCV: 94.5 fL (ref 78.0–100.0)
Monocytes Absolute: 0.3 10*3/uL (ref 0.1–1.0)
Monocytes Relative: 7 %
NEUTROS PCT: 53 %
Neutro Abs: 2.2 10*3/uL (ref 1.7–7.7)
Platelets: 43 10*3/uL — ABNORMAL LOW (ref 150–400)
RBC: 4.55 MIL/uL (ref 3.87–5.11)
RDW: 14.2 % (ref 11.5–15.5)
WBC: 4.1 10*3/uL (ref 4.0–10.5)

## 2017-04-10 NOTE — Clinical Social Work Note (Signed)
Clinical Social Work Assessment  Patient Details  Name: Susan Francis MRN: [id number] Date of Birth: April 10, 1956  Date of referral:  04/10/17               Reason for consult:  Substance Use/ETOH Abuse                Permission sought to share information with:    Permission granted to share information::     Name::        Agency::     Relationship::     Contact Information:     Housing/Transportation Living arrangements for the past 2 months:  Single Family Home Source of Information:  Patient Patient Interpreter Needed:  None Criminal Activity/Legal Involvement Pertinent to Current Situation/Hospitalization:  No - Comment as needed Significant Relationships:  Spouse Lives with:  Spouse Do you feel safe going back to the place where you live?  Yes Need for family participation in patient care:  No (Coment)  Care giving concerns: Substance abuse.   Social Worker assessment / plan:  CSW met with patient at bedside, explain role and reason for visit. Patient reports she has "stuggled with alcohol use for years." She reports earlier this year she attended [facility]IOP program. She reports after loosing her job she did not feel motivated to return. Patient express she was interested in residential treatment and prefers [facility] area because she helps to care for her spouse. She reports she is afraid to leave him alone without support.  CSW discussed residential options, Patient has [facility]  does not qualify her for [facility]. She feels [facility] is too expensive. Patient reports, her best times sober was when she had support from others and going to [facility] groups three times a day. Patient reports she would like "a sober mindset again." Patient reports she plans to go back to her AA groups and possibly the CD-IOP group.  CSW provided actively listening and emotional support. (CSW provided list of private care agencies for additional support for her  spouse)   Plan: Patient prefers to follow up with her AA groups or CD-IOP.  Employment status:  Disabled (Comment on whether or not currently receiving Disability) (Patient recieves disability ) Insurance information:  Managed Medicare PT Recommendations:  Not assessed at this time Information / Referral to community resources:  Residential Substance Abuse Treatment Options, Outpatient Substance Abuse Treatment Options  Patient/Family's Response to care:  Agreeable and Responding well to care. Appreciative of CSW services.  Patient/Family's Understanding of and Emotional Response to Diagnosis, Current Treatment, and Prognosis:  She plans to look into getting help at home, so she can focus on her own needs.Patient understands that her alcohol withdrawals are causing her seizures.     Emotional Assessment Appearance:  Developmentally appropriate Attitude/Demeanor/Rapport:    Affect (typically observed):  Calm, Pleasant, Hopeful Orientation:  Oriented to Self, Oriented to Place, Oriented to  Time, Oriented to Situation Alcohol / Substance use:  Alcohol Use Psych involvement (Current and /or in the community):  No (Comment)  Discharge Needs  Concerns to be addressed:  Substance Abuse Concerns Readmission within the last 30 days:  No Current discharge risk:  Substance Abuse Barriers to Discharge:  No Barriers Identified   Lia Hopping, LCSW 04/10/2017, 11:31 AM

## 2017-04-10 NOTE — Progress Notes (Signed)
PROGRESS NOTE    Susan Francis  RJJ:[id number] DOB: May 09, 1956 DOA: 04/08/2017 PCP: Leeroy Cha, MD   Brief Narrative:  Patient is a 61 year old female history of ongoing chronic alcohol use, chronic alcohol withdrawal including DTs and seizures, seizure disorder, cirrhosis, thrombocytopenia, depression who presents to the ED with her with a seizure per husband. Patient had been on our callback for the past 4-5 days last drink was 2 days prior to admission due to the fact she couldn't drink due to ongoing nausea vomiting. Patient states she's been compliant with her medications except for the past 2 days. Patient does endorse a prior history of seizures in the setting of alcohol withdrawal. Patient currently stable. Continued alcohol withdrawal protocol   Assessment & Plan:   Principal Problem:   Acute, mixed level of activity, alcohol withdrawal delirium (HCC) Active Problems:   Seizure disorder (HCC)   Alcohol use disorder, severe, dependence (HCC)   Alcoholic ketoacidosis   Depression   Disorder involving thrombocytopenia (HCC)   Thyroid activity decreased   Alcoholic cirrhosis of liver without ascites (HCC)   Alcohol withdrawal syndrome with complication ([place])  #1 complicated alcohol withdrawal/alcohol abuse Patient with some clinical improvement today. Patient states tremors have improved. No seizures noted overnight. Continue the Ativan withdrawal protocol. Continue thiamine, folic acid, multivitamin. Gentle hydration. Alcohol cessation stressed to patient. Social worker providing patient with outpatient resources.  #2 seizure disorder No further seizures noted. Patient was loaded with Keppra on presentation to the ED. Continue current home regimen of Keppra. Seizure precautions. Follow.  #3 alcoholic ketoacidosis Resolved with hydration. Follow.  #4 cirrhosis/history of hepatitis C Compensated. Patient with no prior history of GI bleed. Patient with no evidence of  ascites or edema noted. Alcohol cessation. Patient status post successful treatment of hepatitis C.  #5 thrombocytopenia Platelet count has decreased since admission. Patient with no overt bleeding. Monitor.  #6 history of QTC prolongation Repeat EKG with a QTC of 497. Patient chronically on Celexa. Due to age greater than 62  decreased patient's Celexa dose to 20 mg daily. Monitor closely.  #7 depression Decreased patient's Celexa dose to 20 mg daily as patient with age greater than 91 and patient with history of QTC prolongation.  #8 hypothyroidism TSH of 6.536. Continue home dose Synthroid. Will need repeat thyroid function studies done in about 4-6 weeks.  DVT prophylaxis: SCDs Code Status: Full Family Communication: Updated patient. No family at bedside. Disposition Plan: Hopefully home in the next one to 2 days.   Consultants:   None  Procedures:   Chest x-ray 04/08/2017  Antimicrobials:   None   Subjective: Patient states feeling better today. No chest pain. No shortness of breath. No seizures noted. Tolerating current diet.  Objective: Vitals:   04/09/17 1212 04/09/17 1821 04/10/17 0006 04/10/17 0518  BP: 124/71 117/79 133/80 121/90  Pulse: 61 70 66 68  Resp: 20 18 (!) 22 18  Temp: 97.8 F (36.6 C) 98.1 F (36.7 C) 97.9 F (36.6 C) 98 F (36.7 C)  TempSrc: Oral Oral Axillary Oral  SpO2: 98% 100% 97% 96%  Weight:      Height:        Intake/Output Summary (Last 24 hours) at 04/10/17 1322 Last data filed at 04/10/17 0600  Gross per 24 hour  Intake             4140 ml  Output                0 ml  Net             4140 ml   Filed Weights   04/08/17 1500  Weight: 70.8 kg (156 lb 1.4 oz)    Examination:  General exam: Appears calm and comfortable  Respiratory system: Clear to auscultation. Respiratory effort normal. Cardiovascular system: S1 & S2 heard, RRR. No JVD, murmurs, rubs, gallops or clicks. No pedal edema. Gastrointestinal system: Abdomen  is nondistended, soft and nontender. No organomegaly or masses felt. Normal bowel sounds heard. Central nervous system: Alert and oriented. No focal neurological deficits. Extremities: Symmetric 5 x 5 power. Skin: No rashes, lesions or ulcers Psychiatry: Judgement and insight appear fair. Mood & affect appropriate.     Data Reviewed: I have personally reviewed following labs and imaging studies  CBC:  Recent Labs Lab 04/08/17 0813 04/09/17 0307 04/10/17 0701  WBC 10.5 4.5 4.1  NEUTROABS 8.2*  --  2.2  HGB 17.0* 14.2 14.9  HCT 47.8* 40.3 43.0  MCV 93.5 91.6 94.5  PLT 105* 51* 43*   Basic Metabolic Panel:  Recent Labs Lab 04/08/17 0813 04/09/17 0307 04/10/17 0701  NA 139 138 140  K 4.4 3.3* 3.6  CL 101 109 111  CO2 16* 21* 24  GLUCOSE 120* 122* 101*  BUN 11 7 7   CREATININE 0.87 0.76 0.75  CALCIUM 8.5* 7.9* 8.2*  MG  --  2.3  --    GFR: Estimated Creatinine Clearance: 64.8 mL/min (by C-G formula based on SCr of 0.75 mg/dL). Liver Function Tests:  Recent Labs Lab 04/08/17 0813 04/09/17 0307  AST 54* 44*  ALT 29 21  ALKPHOS 79 56  BILITOT 1.7* 1.8*  PROT 7.1 5.9*  ALBUMIN 4.6 3.7    Recent Labs Lab 04/08/17 0813  LIPASE 29    Recent Labs Lab 04/08/17 0813  AMMONIA 38*   Coagulation Profile: No results for input(s): INR, PROTIME in the last 168 hours. Cardiac Enzymes: No results for input(s): CKTOTAL, CKMB, CKMBINDEX, TROPONINI in the last 168 hours. BNP (last 3 results) No results for input(s): PROBNP in the last 8760 hours. HbA1C: No results for input(s): HGBA1C in the last 72 hours. CBG: No results for input(s): GLUCAP in the last 168 hours. Lipid Profile: No results for input(s): CHOL, HDL, LDLCALC, TRIG, CHOLHDL, LDLDIRECT in the last 72 hours. Thyroid Function Tests:  Recent Labs  04/09/17 0814  TSH 6.536*   Anemia Panel: No results for input(s): VITAMINB12, FOLATE, FERRITIN, TIBC, IRON, RETICCTPCT in the last 72 hours. Sepsis  Labs: No results for input(s): PROCALCITON, LATICACIDVEN in the last 168 hours.  Recent Results (from the past 240 hour(s))  MRSA PCR Screening     Status: None   Collection Time: 04/08/17  3:16 PM  Result Value Ref Range Status   MRSA by PCR NEGATIVE NEGATIVE Final    Comment:        The GeneXpert MRSA Assay (FDA approved for NASAL specimens only), is one component of a comprehensive MRSA colonization surveillance program. It is not intended to diagnose MRSA infection nor to guide or monitor treatment for MRSA infections.          Radiology Studies: No results found.      Scheduled Meds: . citalopram  20 mg Oral Daily  . folic acid  1 mg Oral Daily  . levETIRAcetam  1,000 mg Oral BID  . LORazepam  0-4 mg Intravenous Q12H   Or  . LORazepam  0-4 mg Oral Q12H  . LORazepam  1  mg Intravenous Once  . multivitamin with minerals  1 tablet Oral Daily  . thiamine  100 mg Oral Daily   Or  . thiamine  100 mg Intravenous Daily  . topiramate  50 mg Oral BID   Continuous Infusions: . sodium chloride 100 mL/hr at 04/09/17 1435     LOS: 2 days    Time spent: 67 mins    Shanese Riemenschneider, MD [facility] Pager [phone]  If 7PM-7AM, please contact night-coverage [email or web address] Password [email or web address] 04/10/2017, 1:22 PM

## 2017-04-10 NOTE — Progress Notes (Signed)
Follow up with Susan Francis while rounding.  Pt sleeping chaplain arrival.  Will continue to follow for support.   Spoke with [name], CD-IOP counselor today about pt's hospitalization.    [facility] Jerene Pitch, MDiv

## 2017-04-11 LAB — CBC
HEMATOCRIT: 45 % (ref 36.0–46.0)
Hemoglobin: 15.6 g/dL — ABNORMAL HIGH (ref 12.0–15.0)
MCH: 32.5 pg (ref 26.0–34.0)
MCHC: 34.7 g/dL (ref 30.0–36.0)
MCV: 93.8 fL (ref 78.0–100.0)
Platelets: 47 10*3/uL — ABNORMAL LOW (ref 150–400)
RBC: 4.8 MIL/uL (ref 3.87–5.11)
RDW: 14.2 % (ref 11.5–15.5)
WBC: 4.2 10*3/uL (ref 4.0–10.5)

## 2017-04-11 LAB — BASIC METABOLIC PANEL
Anion gap: 8 (ref 5–15)
BUN: 8 mg/dL (ref 6–20)
CHLORIDE: 109 mmol/L (ref 101–111)
CO2: 21 mmol/L — AB (ref 22–32)
Calcium: 8.7 mg/dL — ABNORMAL LOW (ref 8.9–10.3)
Creatinine, Ser: 0.66 mg/dL (ref 0.44–1.00)
GFR calc Af Amer: >60 mL/min (ref >60)
GLUCOSE: 97 mg/dL (ref 65–99)
POTASSIUM: 3.4 mmol/L — AB (ref 3.5–5.1)
Sodium: 138 mmol/L (ref 135–145)

## 2017-04-11 MED ORDER — CITALOPRAM HYDROBROMIDE 20 MG PO TABS: 20.0000 mg | ORAL_TABLET | Freq: Every day | ORAL | 0 refills | Status: DC

## 2017-04-11 MED ORDER — THIAMINE HCL 100 MG PO TABS: 100.0000 mg | ORAL_TABLET | Freq: Every day | ORAL | Status: DC

## 2017-04-11 MED ORDER — ADULT MULTIVITAMIN W/MINERALS CH: 1.0000 | ORAL_TABLET | Freq: Every day | ORAL | Status: DC

## 2017-04-11 MED ORDER — FOLIC ACID 1 MG PO TABS: 1.0000 mg | ORAL_TABLET | Freq: Every day | ORAL | Status: DC

## 2017-04-11 MED ORDER — POTASSIUM CHLORIDE CRYS ER 20 MEQ PO TBCR
40.0000 meq | EXTENDED_RELEASE_TABLET | Freq: Once | ORAL | Status: AC
  Administered 2017-04-11: 40 meq via ORAL
  Filled 2017-04-11: qty 2

## 2017-04-11 NOTE — Progress Notes (Signed)
Pharmacy IV to PO conversion  The patient is ordered Thiamine by the intravenous route with a linked PO option available.  Based on criteria approved by the Pharmacy and [facility], the IV option is being discontinued.   No active GI bleeding or impaired absorption  Not s/p esophagectomy  Documented ability to take oral medications for > 24 hr  Plan to continue treatment for at least 1 day  If you have any questions about this conversion, please contact the Pharmacy Department (ext [phone]).  Thank you.  Reuel Boom, PharmD Pager: [phone] 04/11/2017, 2:29 PM

## 2017-04-11 NOTE — Progress Notes (Signed)
Patient given discharge instructions, and verbalized an understanding of all discharge instructions.  Patient agrees with discharge plan, and is being discharged in stable medical condition.  Patient given transportation via wheelchair. 

## 2017-04-11 NOTE — Evaluation (Signed)
Physical Therapy Evaluation-1x Patient Details Name: Susan Francis MRN: [id number] DOB: 1956/02/14 Today's Date: 04/11/2017   History of Present Illness  61 yo female admitted with alcohol withdrawal delirium. Hx of ETOH abuse, drug abuse, HEP C, seizures, DTs, cirrhosis.   Clinical Impression  On eval, pt was Min guard assist for mobility. She walked ~250 feet in hallway without an assistive device. She is mildly unsteady at times. She tolerated distance well. Recommended to pt that she use her RW and/or cane for ambulation safety if/when needed-pt agreeable. Do not anticipate any follow up PT needs. Recommend daily ambulation in hallway with nursing supervision. 1x eval. Will sign off.     Follow Up Recommendations No PT follow up;Supervision for mobility/OOB    Equipment Recommendations  None recommended by PT (pt stated she has access to a RW/cane)    Recommendations for Other Services       Precautions / Restrictions Precautions Precautions: Fall Restrictions Weight Bearing Restrictions: No      Mobility  Bed Mobility Overal bed mobility: Independent                Transfers Overall transfer level: Modified independent                  Ambulation/Gait Ambulation/Gait assistance: Min guard Ambulation Distance (Feet): 250 Feet Assistive device: None Gait Pattern/deviations: Step-through pattern;Staggering left;Staggering right;Drifts right/left     General Gait Details: close guard for safety. Unsteady at times with staggering, drifting. No overt LOB.   Stairs            Wheelchair Mobility    Modified Rankin (Stroke Patients Only)       Balance Overall balance assessment: Needs assistance         Standing balance support: No upper extremity supported Standing balance-Leahy Scale: Fair               High level balance activites: Head turns;Direction changes;Turns High Level Balance Comments: close guard for safety              Pertinent Vitals/Pain Pain Assessment: No/denies pain    Home Living Family/patient expects to be discharged to:: Private residence     Type of Home: Mobile home Home Access: Stairs to enter Entrance Stairs-Rails: Left Entrance Stairs-Number of Steps: 5 Home Layout: One level Home Equipment: Walker - 2 wheels;Cane - single point (per pt report)      Prior Function Level of Independence: Independent               Hand Dominance        Extremity/Trunk Assessment   Upper Extremity Assessment Upper Extremity Assessment: Overall WFL for tasks assessed    Lower Extremity Assessment Lower Extremity Assessment: Generalized weakness       Communication   Communication: No difficulties  Cognition Arousal/Alertness: Awake/alert Behavior During Therapy: WFL for tasks assessed/performed Overall Cognitive Status: Within Functional Limits for tasks assessed                                 General Comments: some memory issues      General Comments      Exercises     Assessment/Plan    PT Assessment Patent does not need any further PT services  PT Problem List Decreased balance       PT Treatment Interventions      PT Goals (Current goals can be found  in the Care Plan section)  Acute Rehab PT Goals Patient Stated Goal: home PT Goal Formulation: All assessment and education complete, DC therapy    Frequency     Barriers to discharge        Co-evaluation               AM-PAC PT "6 Clicks" Daily Activity  Outcome Measure Difficulty turning over in bed (including adjusting bedclothes, sheets and blankets)?: None Difficulty moving from lying on back to sitting on the side of the bed? : None Difficulty sitting down on and standing up from a chair with arms (e.g., wheelchair, bedside commode, etc,.)?: None Help needed moving to and from a bed to chair (including a wheelchair)?: None Help needed walking in hospital room?: A  Little Help needed climbing 3-5 steps with a railing? : A Little 6 Click Score: 22    End of Session Equipment Utilized During Treatment: Gait belt Activity Tolerance: Patient tolerated treatment well Patient left: in bed;with call bell/phone within reach;with bed alarm set   PT Visit Diagnosis: Difficulty in walking, not elsewhere classified (R26.2);Muscle weakness (generalized) (M62.81)    Time: 0998-3382 PT Time Calculation (min) (ACUTE ONLY): 11 min   Charges:   PT Evaluation $PT Eval Low Complexity: 1 Low     PT G Codes:          Weston Anna, MPT Pager: [phone]

## 2017-04-11 NOTE — Discharge Summary (Signed)
Physician Discharge Summary  Susan Francis WPY:[id number] DOB: [birth year] DOA: 04/08/2017  PCP: Leeroy Cha, MD  Admit date: 04/08/2017 Discharge date: 04/11/2017  Time spent: 60  minutes  Recommendations for Outpatient Follow-up:  1. Follow-up with Leeroy Cha, MD 2 weeks. On follow-up patient will need a basic metabolic profile done to follow-up on electrolytes and renal function. Patient will need repeat thyroid function studies done in about 4-6 weeks. 2. Patient is to follow-up with outpatient alcohol rehabilitation resources.   Discharge Diagnoses:  Principal Problem:   Acute, mixed level of activity, alcohol withdrawal delirium (HCC) Active Problems:   Seizure disorder ([place])   Alcohol use disorder, severe, dependence (HCC)   Alcoholic ketoacidosis   Depression   Disorder involving thrombocytopenia ([place])   Thyroid activity decreased   Alcoholic cirrhosis of liver without ascites ([place])   Alcohol withdrawal syndrome with complication [facility])   Discharge Condition: Stable and improved  Diet recommendation: Regular  Filed Weights   04/08/17 1500  Weight: 70.8 kg (156 lb 1.4 oz)    History of present illness:  Per Dr. Ellwood Handler is a 61 y.o. female with medical history significant of ongoing chronic alcohol use, complicated alcohol withdrawal including DTs and seizures, seizure disorder with history of status epilepticus, hepatitis C status post effective treatment, cirrhosis, thrombocytopenia and depression who was brought in via ambulance due to witnessed seizure per husband.  Patient stated that she had been on an alcohol binge for the past 4-5 days. She noted her last drink was probably 2 days prior to admission mostly because she was unable to drink anymore due to ongoing nausea and vomiting. Patient noted she had been taking her medications every day except for the past 2 days. Patient was not sure if she has had a seizure but did admit she  has had seizures in the setting of alcohol withdrawal in the past.  At present patient denied overt confusion. She stated she knows she was brought here by ambulance and that she was in the [facility] emergency room. Patient has no specific complaints but did admit that she would like to stop drinking again. She did admit to a history of DTs and seizures.  Review of systems was negative for any tongue biting, urinary incontinence, headache, head trauma, abdominal pain, chest pain or shortness of breath.    ED Course: In the emergency room patient was noted to be acutely delirious and agitated. She was initiated on the CIWA protocol and was treated with Ativan 4 mg as well as normal saline 2 L bolus. She felt much improved.  Hospital Course:  #1 complicated alcohol withdrawal/alcohol abuse Patient was admitted without call withdrawal/alcohol abuse. Patient was initially placed in the stepdown unit for closer monitoring. Patient had no further seizures during the hospitalization. Patient initially had some tremors. Patient was maintained on Ativan withdrawal protocol with thiamine and folic acid and multivitamin. Patient was hydrated gently with IV fluids. Alcohol cessation stressed to patient. Social worker provided patient with outpatient resources. Patient improved clinically and patient was discharged home in stable and improved condition with information for resources for outpatient alcohol rehabilitation.  #2 seizure disorder No further seizures noted. Patient was loaded with Keppra on presentation to the ED. Patient was maintained on home regimen of Keppra. Outpatient follow-up.  #3 alcoholic ketoacidosis Resolved with hydration. Follow.  #4 cirrhosis/history of hepatitis C Compensated. Patient with no prior history of GI bleed. Patient with no evidence of ascites or edema  noted. Alcohol cessation. Patient status post successful treatment of hepatitis C.  #5  thrombocytopenia Platelet count has decreased since admission. Patient with no overt bleeding. Platelet count had stabilized by day of discharge at 47,000.  #6 history of QTC prolongation Repeat EKG with a QTC of 497. Patient chronically on Celexa. Due to age greater than 80  decreased patient's Celexa dose to 20 mg daily. Monitor closely.  #7 depression Decreased patient's Celexa dose to 20 mg daily as patient with age greater than 46 and patient with history of QTC prolongation. O platelet count has stabilized by [place] discharge utpatient follow-up.  #8 hypothyroidism TSH of 6.536. Continued on home dose Synthroid. Will need repeat thyroid function studies done in about 4-6 weeks.   Procedures:  Chest x-ray 04/08/2017  Consultations:  None  Discharge Exam: Vitals:   04/11/17 0626 04/11/17 1318  BP: 122/77 136/88  Pulse: 62 66  Resp: 17 16  Temp: 97.8 F (36.6 C) 98.6 F (37 C)  SpO2: 98% 100%    General: NAD Cardiovascular: RRR Respiratory: CTAB  Discharge Instructions   Discharge Instructions    Diet general    Complete by:  As directed    Increase activity slowly    Complete by:  As directed      Current Discharge Medication List    START taking these medications   Details  folic acid (FOLVITE) 1 MG tablet Take 1 tablet (1 mg total) by mouth daily.    Multiple Vitamin (MULTIVITAMIN WITH MINERALS) TABS tablet Take 1 tablet by mouth daily.    thiamine 100 MG tablet Take 1 tablet (100 mg total) by mouth daily.      CONTINUE these medications which have CHANGED   Details  citalopram (CELEXA) 20 MG tablet Take 1 tablet (20 mg total) by mouth daily. Qty: 30 tablet, Refills: 0      CONTINUE these medications which have NOT CHANGED   Details  levETIRAcetam (KEPPRA) 1000 MG tablet Take 1 tablet (1,000 mg total) by mouth 2 (two) times daily. Qty: 180 tablet, Refills: 3   Associated Diagnoses: Localization-related symptomatic epilepsy and epileptic  syndromes with complex partial seizures, not intractable, with status epilepticus (HCC)    topiramate (TOPAMAX) 50 MG tablet Take 1 tablet (50 mg total) by mouth 2 (two) times daily. Qty: 180 tablet, Refills: 1      STOP taking these medications     potassium chloride SA (K-DUR,KLOR-CON) 20 MEQ tablet        Allergies  Allergen Reactions  . Penicillins Nausea Only and Rash    Has patient had a PCN reaction causing immediate rash, facial/tongue/throat swelling, SOB or lightheadedness with hypotension: Unknown Has patient had a PCN reaction causing severe rash involving mucus membranes or skin necrosis: Yes Has patient had a PCN reaction that required hospitalization: Yes Has patient had a PCN reaction occurring within the last 10 years: Yes If all of the above answers are "NO", then may proceed with Cephalosporin use.   . Adhesive [Tape] Hives and Other (See Comments)    Skin peels off (paper tape is ok)  . Oxycodone Hcl Hives  . Protonix [Pantoprazole Sodium] Itching    Maybe be brand related to the generic per patient   Follow-up Information    Leeroy Cha, MD. Schedule an appointment as soon as possible for a visit in 2 week(s).   Specialty:  Internal Medicine Contact information: [address] [place] [address] [phone]  out patient alcohol rehab Follow up.            The results of significant diagnostics from this hospitalization (including imaging, microbiology, ancillary and laboratory) are listed below for reference.    Significant Diagnostic Studies: Dg Chest Port 1 View  Result Date: 04/08/2017 CLINICAL DATA:  Hepatic cirrhosis.  Short of breath.  Seizure. EXAM: PORTABLE CHEST 1 VIEW COMPARISON:  07/13/2014 FINDINGS: Anterior cervical fusion noted. Normal mediastinum and cardiac silhouette. Normal pulmonary vasculature. No evidence of effusion, infiltrate, or pneumothorax. No acute bony abnormality. IMPRESSION: No acute  cardiopulmonary process. Electronically Signed   By: Suzy Bouchard M.D.   On: 04/08/2017 09:37    Microbiology: Recent Results (from the past 240 hour(s))  MRSA PCR Screening     Status: None   Collection Time: 04/08/17  3:16 PM  Result Value Ref Range Status   MRSA by PCR NEGATIVE NEGATIVE Final    Comment:        The GeneXpert MRSA Assay (FDA approved for NASAL specimens only), is one component of a comprehensive MRSA colonization surveillance program. It is not intended to diagnose MRSA infection nor to guide or monitor treatment for MRSA infections.      Labs: Basic Metabolic Panel:  Recent Labs Lab 04/08/17 0813 04/09/17 0307 04/10/17 0701 04/11/17 0558  NA 139 138 140 138  K 4.4 3.3* 3.6 3.4*  CL 101 109 111 109  CO2 16* 21* 24 21*  GLUCOSE 120* 122* 101* 97  BUN 11 7 7 8   CREATININE 0.87 0.76 0.75 0.66  CALCIUM 8.5* 7.9* 8.2* 8.7*  MG  --  2.3  --   --    Liver Function Tests:  Recent Labs Lab 04/08/17 0813 04/09/17 0307  AST 54* 44*  ALT 29 21  ALKPHOS 79 56  BILITOT 1.7* 1.8*  PROT 7.1 5.9*  ALBUMIN 4.6 3.7    Recent Labs Lab 04/08/17 0813  LIPASE 29    Recent Labs Lab 04/08/17 0813  AMMONIA 38*   CBC:  Recent Labs Lab 04/08/17 0813 04/09/17 0307 04/10/17 0701 04/11/17 0558  WBC 10.5 4.5 4.1 4.2  NEUTROABS 8.2*  --  2.2  --   HGB 17.0* 14.2 14.9 15.6*  HCT 47.8* 40.3 43.0 45.0  MCV 93.5 91.6 94.5 93.8  PLT 105* 51* 43* 47*   Cardiac Enzymes: No results for input(s): CKTOTAL, CKMB, CKMBINDEX, TROPONINI in the last 168 hours. BNP: BNP (last 3 results) No results for input(s): BNP in the last 8760 hours.  ProBNP (last 3 results) No results for input(s): PROBNP in the last 8760 hours.  CBG: No results for input(s): GLUCAP in the last 168 hours.     SignedIrine Seal MD.  [facility] 04/11/2017, 2:32 PM

## 2017-04-11 NOTE — Evaluation (Signed)
Occupational Therapy Evaluation Patient Details Name: Susan Francis MRN: [id number] DOB: 07/19/1956 Today's Date: 04/11/2017    History of Present Illness 61 yo female admitted with alcohol withdrawal delirium. Hx of ETOH abuse, drug abuse, HEP C, seizures, DTs, cirrhosis.    Clinical Impression   Pt was admitted for the above. She needs set up or initial min guard for adls to gather items herself. No further OT is needed at this time    Follow Up Recommendations  No OT follow up;Supervision - Intermittent    Equipment Recommendations  None recommended by OT    Recommendations for Other Services       Precautions / Restrictions Precautions Precautions: Fall Restrictions Weight Bearing Restrictions: No      Mobility Bed Mobility Overal bed mobility: Independent                Transfers Overall transfer level: Modified independent                    Balance                                           ADL either performed or assessed with clinical judgement   ADL Overall ADL's : Needs assistance/impaired                         Toilet Transfer: Min guard;Supervision/safety;Ambulation       Tub/ Shower Transfer: Hydrologist;Ambulation;Walk-in shower     General ADL Comments: pt is able to complete ADLs with set up or min guard/supervision to gather items. She is a little unsteady when she first gets up, but this improves.  Talked to her about general safety, especially with shower.  She states she pretty much needs to be independent at home. Also educated her on pacing herself and breaking activities up.  She did not have clothing to discharge home in.  I brought paper scrubs, but while I was gone, she arranged to have someone bring her a sundress. She will wait for that.     Vision         Perception     Praxis      Pertinent Vitals/Pain Pain Assessment: No/denies pain     Hand Dominance      Extremity/Trunk Assessment Upper Extremity Assessment Upper Extremity Assessment: Overall WFL for tasks assessed           Communication Communication Communication: No difficulties   Cognition Arousal/Alertness: Awake/alert Behavior During Therapy: WFL for tasks assessed/performed Overall Cognitive Status: Within Functional Limits for tasks assessed                                     General Comments       Exercises     Shoulder Instructions      Home Living Family/patient expects to be discharged to:: Private residence Living Arrangements: Spouse/significant other Available Help at Discharge: Family               Bathroom Shower/Tub: Walk-in Psychologist, prison and probation services: Standard     Home Equipment: Grab bars - tub/shower;Shower seat          Prior Functioning/Environment Level of Independence: Independent        Comments: including IADLs  OT Problem List:        OT Treatment/Interventions:      OT Goals(Current goals can be found in the care plan section) Acute Rehab OT Goals Patient Stated Goal: home OT Goal Formulation: All assessment and education complete, DC therapy  OT Frequency:     Barriers to D/C:            Co-evaluation              AM-PAC PT "6 Clicks" Daily Activity     Outcome Measure Help from another person eating meals?: None Help from another person taking care of personal grooming?: A Little Help from another person toileting, which includes using toliet, bedpan, or urinal?: A Little Help from another person bathing (including washing, rinsing, drying)?: A Little Help from another person to put on and taking off regular upper body clothing?: A Little Help from another person to put on and taking off regular lower body clothing?: A Little 6 Click Score: 19   End of Session    Activity Tolerance: Patient tolerated treatment well Patient left: in bed;with call bell/phone within reach  OT  Visit Diagnosis: Unsteadiness on feet (R26.81)                Time: 1351-1400 OT Time Calculation (min): 9 min Charges:  OT General Charges $OT Visit: 1 Procedure OT Evaluation $OT Eval Low Complexity: 1 Procedure G-Codes:     [place], OTR/L [phone] 04/11/2017  Dmya Long 04/11/2017, 2:07 PM

## 2017-04-14 ENCOUNTER — Other Ambulatory Visit (HOSPITAL_COMMUNITY): Payer: Medicare HMO

## 2017-04-16 ENCOUNTER — Other Ambulatory Visit (HOSPITAL_COMMUNITY): Payer: Medicare HMO

## 2017-04-17 ENCOUNTER — Other Ambulatory Visit (HOSPITAL_COMMUNITY): Payer: Medicare HMO

## 2017-04-18 DIAGNOSIS — G40909 Epilepsy, unspecified, not intractable, without status epilepticus: Secondary | ICD-10-CM | POA: Diagnosis not present

## 2017-04-18 DIAGNOSIS — Z780 Asymptomatic menopausal state: Secondary | ICD-10-CM | POA: Diagnosis not present

## 2017-04-18 DIAGNOSIS — K7460 Unspecified cirrhosis of liver: Secondary | ICD-10-CM | POA: Diagnosis not present

## 2017-04-18 DIAGNOSIS — F10239 Alcohol dependence with withdrawal, unspecified: Secondary | ICD-10-CM | POA: Diagnosis not present

## 2017-04-18 DIAGNOSIS — E039 Hypothyroidism, unspecified: Secondary | ICD-10-CM | POA: Diagnosis not present

## 2017-04-21 ENCOUNTER — Other Ambulatory Visit (HOSPITAL_COMMUNITY): Payer: Medicare HMO

## 2017-04-23 ENCOUNTER — Other Ambulatory Visit (HOSPITAL_COMMUNITY): Payer: Medicare HMO

## 2017-04-24 ENCOUNTER — Other Ambulatory Visit (HOSPITAL_COMMUNITY): Payer: Medicare HMO

## 2017-04-28 ENCOUNTER — Other Ambulatory Visit (HOSPITAL_COMMUNITY): Payer: Medicare HMO

## 2017-04-30 ENCOUNTER — Other Ambulatory Visit (HOSPITAL_COMMUNITY): Payer: Medicare HMO

## 2017-05-01 ENCOUNTER — Other Ambulatory Visit (HOSPITAL_COMMUNITY): Payer: Medicare HMO

## 2017-05-07 ENCOUNTER — Other Ambulatory Visit (HOSPITAL_COMMUNITY): Payer: Medicare HMO

## 2017-06-27 ENCOUNTER — Emergency Department (HOSPITAL_COMMUNITY)
Admission: EM | Admit: 2017-06-27 | Discharge: 2017-06-27 | Disposition: A | Discharge status: Home / Self Care | Payer: No Typology Code available for payment source | Attending: Emergency Medicine | Admitting: Emergency Medicine

## 2017-06-27 ENCOUNTER — Emergency Department (HOSPITAL_COMMUNITY): Payer: No Typology Code available for payment source

## 2017-06-27 ENCOUNTER — Encounter (HOSPITAL_COMMUNITY): Payer: Self-pay | Admitting: Emergency Medicine

## 2017-06-27 DIAGNOSIS — E079 Disorder of thyroid, unspecified: Secondary | ICD-10-CM | POA: Diagnosis not present

## 2017-06-27 DIAGNOSIS — Y9389 Activity, other specified: Secondary | ICD-10-CM | POA: Diagnosis not present

## 2017-06-27 DIAGNOSIS — Y92410 Unspecified street and highway as the place of occurrence of the external cause: Secondary | ICD-10-CM | POA: Insufficient documentation

## 2017-06-27 DIAGNOSIS — F17210 Nicotine dependence, cigarettes, uncomplicated: Secondary | ICD-10-CM | POA: Diagnosis not present

## 2017-06-27 DIAGNOSIS — S199XXA Unspecified injury of neck, initial encounter: Secondary | ICD-10-CM | POA: Diagnosis not present

## 2017-06-27 DIAGNOSIS — R51 Headache: Secondary | ICD-10-CM | POA: Diagnosis not present

## 2017-06-27 DIAGNOSIS — Y998 Other external cause status: Secondary | ICD-10-CM | POA: Diagnosis not present

## 2017-06-27 DIAGNOSIS — Z79899 Other long term (current) drug therapy: Secondary | ICD-10-CM | POA: Insufficient documentation

## 2017-06-27 DIAGNOSIS — R569 Unspecified convulsions: Secondary | ICD-10-CM | POA: Diagnosis not present

## 2017-06-27 LAB — CBC
HCT: 46.9 % — ABNORMAL HIGH (ref 36.0–46.0)
Hemoglobin: 16.3 g/dL — ABNORMAL HIGH (ref 12.0–15.0)
MCH: 33.6 pg (ref 26.0–34.0)
MCHC: 34.8 g/dL (ref 30.0–36.0)
MCV: 96.7 fL (ref 78.0–100.0)
PLATELETS: 69 10*3/uL — AB (ref 150–400)
RBC: 4.85 MIL/uL (ref 3.87–5.11)
RDW: 15.3 % (ref 11.5–15.5)
WBC: 5.2 10*3/uL (ref 4.0–10.5)

## 2017-06-27 LAB — RAPID URINE DRUG SCREEN, HOSP PERFORMED
AMPHETAMINES: NOT DETECTED
Barbiturates: NOT DETECTED
Benzodiazepines: NOT DETECTED
COCAINE: NOT DETECTED
OPIATES: NOT DETECTED
TETRAHYDROCANNABINOL: POSITIVE — AB

## 2017-06-27 LAB — BASIC METABOLIC PANEL
Anion gap: 14 (ref 5–15)
BUN: 8 mg/dL (ref 6–20)
CO2: 22 mmol/L (ref 22–32)
CREATININE: 0.84 mg/dL (ref 0.44–1.00)
Calcium: 9.2 mg/dL (ref 8.9–10.3)
Chloride: 101 mmol/L (ref 101–111)
GFR calc Af Amer: >60 mL/min (ref >60)
Glucose, Bld: 123 mg/dL — ABNORMAL HIGH (ref 65–99)
Potassium: 3.6 mmol/L (ref 3.5–5.1)
SODIUM: 137 mmol/L (ref 135–145)

## 2017-06-27 LAB — ETHANOL: Alcohol, Ethyl (B): <10 mg/dL (ref <10)

## 2017-06-27 MED ORDER — CARBAMAZEPINE 200 MG PO TABS: ORAL_TABLET | ORAL | 0 refills | Status: DC

## 2017-06-27 MED ORDER — SODIUM CHLORIDE 0.9 % IV BOLUS (SEPSIS)
1000.0000 mL | Freq: Once | INTRAVENOUS | Status: AC
  Administered 2017-06-27: 1000 mL via INTRAVENOUS

## 2017-06-27 MED ORDER — LORAZEPAM 2 MG/ML IJ SOLN
1.0000 mg | Freq: Once | INTRAMUSCULAR | Status: AC
  Administered 2017-06-27: 1 mg via INTRAVENOUS
  Filled 2017-06-27: qty 1
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
## 2017-06-27 NOTE — ED Notes (Signed)
Patient able to ambulate independently  

## 2017-06-27 NOTE — ED Notes (Signed)
Collected 3 gold tops.
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
## 2017-06-30 LAB — LEVETIRACETAM LEVEL: LEVETIRACETAM: NOT DETECTED ug/mL (ref 10.0–40.0)

## 2017-07-01 ENCOUNTER — Other Ambulatory Visit (HOSPITAL_COMMUNITY): Payer: Self-pay | Admitting: Medical

## 2017-07-14 ENCOUNTER — Other Ambulatory Visit: Payer: Self-pay | Admitting: Neurology

## 2017-07-14 DIAGNOSIS — G40201 Localization-related (focal) (partial) symptomatic epilepsy and epileptic syndromes with complex partial seizures, not intractable, with status epilepticus: Secondary | ICD-10-CM

## 2017-07-21 ENCOUNTER — Observation Stay (HOSPITAL_COMMUNITY)
Admission: EM | Admit: 2017-07-21 | Discharge: 2017-07-23 | Disposition: A | Discharge status: Home / Self Care | Payer: Medicare HMO | Attending: Internal Medicine | Admitting: Internal Medicine

## 2017-07-21 ENCOUNTER — Encounter (HOSPITAL_COMMUNITY): Payer: Self-pay | Admitting: Emergency Medicine

## 2017-07-21 ENCOUNTER — Other Ambulatory Visit: Payer: Self-pay

## 2017-07-21 DIAGNOSIS — Z79899 Other long term (current) drug therapy: Secondary | ICD-10-CM | POA: Diagnosis not present

## 2017-07-21 DIAGNOSIS — D696 Thrombocytopenia, unspecified: Secondary | ICD-10-CM | POA: Diagnosis present

## 2017-07-21 DIAGNOSIS — F10239 Alcohol dependence with withdrawal, unspecified: Principal | ICD-10-CM | POA: Insufficient documentation

## 2017-07-21 DIAGNOSIS — F419 Anxiety disorder, unspecified: Secondary | ICD-10-CM | POA: Diagnosis not present

## 2017-07-21 DIAGNOSIS — R030 Elevated blood-pressure reading, without diagnosis of hypertension: Secondary | ICD-10-CM | POA: Diagnosis not present

## 2017-07-21 DIAGNOSIS — Z888 Allergy status to other drugs, medicaments and biological substances status: Secondary | ICD-10-CM | POA: Diagnosis not present

## 2017-07-21 DIAGNOSIS — R110 Nausea: Secondary | ICD-10-CM | POA: Diagnosis not present

## 2017-07-21 DIAGNOSIS — Z8249 Family history of ischemic heart disease and other diseases of the circulatory system: Secondary | ICD-10-CM | POA: Insufficient documentation

## 2017-07-21 DIAGNOSIS — Z8659 Personal history of other mental and behavioral disorders: Secondary | ICD-10-CM | POA: Diagnosis present

## 2017-07-21 DIAGNOSIS — G40909 Epilepsy, unspecified, not intractable, without status epilepticus: Secondary | ICD-10-CM

## 2017-07-21 DIAGNOSIS — R112 Nausea with vomiting, unspecified: Secondary | ICD-10-CM | POA: Diagnosis present

## 2017-07-21 DIAGNOSIS — K7030 Alcoholic cirrhosis of liver without ascites: Secondary | ICD-10-CM | POA: Diagnosis not present

## 2017-07-21 DIAGNOSIS — E876 Hypokalemia: Secondary | ICD-10-CM | POA: Diagnosis present

## 2017-07-21 DIAGNOSIS — E872 Acidosis: Secondary | ICD-10-CM

## 2017-07-21 DIAGNOSIS — F10939 Alcohol use, unspecified with withdrawal, unspecified: Secondary | ICD-10-CM | POA: Diagnosis present

## 2017-07-21 DIAGNOSIS — Z880 Allergy status to penicillin: Secondary | ICD-10-CM | POA: Diagnosis not present

## 2017-07-21 DIAGNOSIS — F10121 Alcohol abuse with intoxication delirium: Secondary | ICD-10-CM | POA: Diagnosis not present

## 2017-07-21 DIAGNOSIS — K219 Gastro-esophageal reflux disease without esophagitis: Secondary | ICD-10-CM | POA: Diagnosis present

## 2017-07-21 DIAGNOSIS — Z885 Allergy status to narcotic agent status: Secondary | ICD-10-CM | POA: Insufficient documentation

## 2017-07-21 DIAGNOSIS — Z811 Family history of alcohol abuse and dependence: Secondary | ICD-10-CM | POA: Diagnosis not present

## 2017-07-21 DIAGNOSIS — E8729 Other acidosis: Secondary | ICD-10-CM

## 2017-07-21 DIAGNOSIS — F1020 Alcohol dependence, uncomplicated: Secondary | ICD-10-CM | POA: Diagnosis present

## 2017-07-21 DIAGNOSIS — E1110 Type 2 diabetes mellitus with ketoacidosis without coma: Secondary | ICD-10-CM | POA: Diagnosis not present

## 2017-07-21 DIAGNOSIS — F10129 Alcohol abuse with intoxication, unspecified: Secondary | ICD-10-CM | POA: Diagnosis not present

## 2017-07-21 LAB — RAPID URINE DRUG SCREEN, HOSP PERFORMED
Amphetamines: NOT DETECTED
Barbiturates: NOT DETECTED
Benzodiazepines: NOT DETECTED
COCAINE: NOT DETECTED
OPIATES: NOT DETECTED
Tetrahydrocannabinol: POSITIVE — AB

## 2017-07-21 MED ORDER — SODIUM CHLORIDE 0.9 % IV SOLN
1000.0000 mg | Freq: Once | INTRAVENOUS | Status: AC
  Administered 2017-07-22: 1000 mg via INTRAVENOUS
  Filled 2017-07-21: qty 10

## 2017-07-21 MED ORDER — LORAZEPAM 2 MG/ML IJ SOLN
1.0000 mg | Freq: Once | INTRAMUSCULAR | Status: AC
  Administered 2017-07-22: 1 mg via INTRAVENOUS
  Filled 2017-07-21: qty 1

## 2017-07-21 MED ORDER — THIAMINE HCL 100 MG/ML IJ SOLN
Freq: Once | INTRAVENOUS | Status: AC
  Administered 2017-07-22: 01:00:00 via INTRAVENOUS
  Filled 2017-07-21: qty 1000

## 2017-07-21 MED ORDER — SODIUM CHLORIDE 0.9 % IV BOLUS (SEPSIS)
1000.0000 mL | Freq: Once | INTRAVENOUS | Status: AC
  Administered 2017-07-22: 1000 mL via INTRAVENOUS

## 2017-07-21 NOTE — ED Notes (Signed)
Bed: RESB Expected date:  Expected time:  Means of arrival:  Comments: 61 yo F/ETOH withdrawl

## 2017-07-21 NOTE — ED Triage Notes (Addendum)
Pt from home brought to ED with nausea and dry heaves related to ETOH abuse with last consumed was Saturday. Pt admit to consuming ETOH daily in large amounts. Currently c/o chills shakes headahce light sensitivity and nausea. VS BP:  176/92, HR 91, resp: 16, Sat 99% RA, CBG 180

## 2017-07-22 ENCOUNTER — Encounter (HOSPITAL_COMMUNITY): Payer: Self-pay | Admitting: Emergency Medicine

## 2017-07-22 DIAGNOSIS — R110 Nausea: Secondary | ICD-10-CM | POA: Diagnosis not present

## 2017-07-22 DIAGNOSIS — R030 Elevated blood-pressure reading, without diagnosis of hypertension: Secondary | ICD-10-CM | POA: Diagnosis not present

## 2017-07-22 DIAGNOSIS — E876 Hypokalemia: Secondary | ICD-10-CM | POA: Diagnosis not present

## 2017-07-22 DIAGNOSIS — D696 Thrombocytopenia, unspecified: Secondary | ICD-10-CM | POA: Diagnosis not present

## 2017-07-22 DIAGNOSIS — F1020 Alcohol dependence, uncomplicated: Secondary | ICD-10-CM

## 2017-07-22 DIAGNOSIS — R112 Nausea with vomiting, unspecified: Secondary | ICD-10-CM

## 2017-07-22 DIAGNOSIS — F10239 Alcohol dependence with withdrawal, unspecified: Secondary | ICD-10-CM | POA: Diagnosis present

## 2017-07-22 DIAGNOSIS — F10939 Alcohol use, unspecified with withdrawal, unspecified: Secondary | ICD-10-CM | POA: Diagnosis present

## 2017-07-22 DIAGNOSIS — Z8659 Personal history of other mental and behavioral disorders: Secondary | ICD-10-CM | POA: Diagnosis present

## 2017-07-22 DIAGNOSIS — G40909 Epilepsy, unspecified, not intractable, without status epilepticus: Secondary | ICD-10-CM

## 2017-07-22 DIAGNOSIS — E872 Acidosis: Secondary | ICD-10-CM

## 2017-07-22 DIAGNOSIS — K7030 Alcoholic cirrhosis of liver without ascites: Secondary | ICD-10-CM

## 2017-07-22 DIAGNOSIS — K219 Gastro-esophageal reflux disease without esophagitis: Secondary | ICD-10-CM

## 2017-07-22 DIAGNOSIS — Z79899 Other long term (current) drug therapy: Secondary | ICD-10-CM | POA: Diagnosis not present

## 2017-07-22 DIAGNOSIS — Z885 Allergy status to narcotic agent status: Secondary | ICD-10-CM | POA: Diagnosis not present

## 2017-07-22 LAB — CBC WITH DIFFERENTIAL/PLATELET
Basophils Absolute: 0 10*3/uL (ref 0.0–0.1)
Basophils Relative: 0 %
EOS ABS: 0 10*3/uL (ref 0.0–0.7)
EOS PCT: 0 %
HCT: 47.2 % — ABNORMAL HIGH (ref 36.0–46.0)
Hemoglobin: 17.4 g/dL — ABNORMAL HIGH (ref 12.0–15.0)
LYMPHS ABS: 0.6 10*3/uL — AB (ref 0.7–4.0)
Lymphocytes Relative: 13 %
MCH: 34.3 pg — AB (ref 26.0–34.0)
MCHC: 36.9 g/dL — ABNORMAL HIGH (ref 30.0–36.0)
MCV: 92.9 fL (ref 78.0–100.0)
Monocytes Absolute: 0.4 10*3/uL (ref 0.1–1.0)
Monocytes Relative: 8 %
Neutro Abs: 3.7 10*3/uL (ref 1.7–7.7)
Neutrophils Relative %: 79 %
PLATELETS: 62 10*3/uL — AB (ref 150–400)
RBC: 5.08 MIL/uL (ref 3.87–5.11)
RDW: 14.3 % (ref 11.5–15.5)
WBC: 4.7 10*3/uL (ref 4.0–10.5)

## 2017-07-22 LAB — SALICYLATE LEVEL: Salicylate Lvl: <7 mg/dL (ref 2.8–30.0)

## 2017-07-22 LAB — COMPREHENSIVE METABOLIC PANEL
ALK PHOS: 84 U/L (ref 38–126)
ALT: 23 U/L (ref 14–54)
ALT: 24 U/L (ref 14–54)
ANION GAP: 17 — AB (ref 5–15)
AST: 38 U/L (ref 15–41)
AST: 45 U/L — ABNORMAL HIGH (ref 15–41)
Albumin: 4.1 g/dL (ref 3.5–5.0)
Albumin: 4.7 g/dL (ref 3.5–5.0)
Alkaline Phosphatase: 93 U/L (ref 38–126)
Anion gap: 11 (ref 5–15)
BUN: 5 mg/dL — AB (ref 6–20)
CALCIUM: 7.9 mg/dL — AB (ref 8.9–10.3)
CHLORIDE: 104 mmol/L (ref 101–111)
CHLORIDE: 97 mmol/L — AB (ref 101–111)
CO2: 22 mmol/L (ref 22–32)
CO2: 20 mmol/L — AB (ref 22–32)
CREATININE: 0.64 mg/dL (ref 0.44–1.00)
CREATININE: 0.78 mg/dL (ref 0.44–1.00)
Calcium: 9.1 mg/dL (ref 8.9–10.3)
GFR calc Af Amer: >60 mL/min (ref >60)
GFR calc non Af Amer: >60 mL/min (ref >60)
Glucose, Bld: 157 mg/dL — ABNORMAL HIGH (ref 65–99)
Glucose, Bld: 163 mg/dL — ABNORMAL HIGH (ref 65–99)
POTASSIUM: 2.6 mmol/L — AB (ref 3.5–5.1)
Potassium: 3.4 mmol/L — ABNORMAL LOW (ref 3.5–5.1)
SODIUM: 137 mmol/L (ref 135–145)
SODIUM: 134 mmol/L — AB (ref 135–145)
Total Bilirubin: 2.8 mg/dL — ABNORMAL HIGH (ref 0.3–1.2)
Total Bilirubin: 3.8 mg/dL — ABNORMAL HIGH (ref 0.3–1.2)
Total Protein: 6.3 g/dL — ABNORMAL LOW (ref 6.5–8.1)
Total Protein: 6.9 g/dL (ref 6.5–8.1)

## 2017-07-22 LAB — CBC
HCT: 44.1 % (ref 36.0–46.0)
Hemoglobin: 16.1 g/dL — ABNORMAL HIGH (ref 12.0–15.0)
MCH: 34.5 pg — AB (ref 26.0–34.0)
MCHC: 36.5 g/dL — ABNORMAL HIGH (ref 30.0–36.0)
MCV: 94.4 fL (ref 78.0–100.0)
PLATELETS: 49 10*3/uL — AB (ref 150–400)
RBC: 4.67 MIL/uL (ref 3.87–5.11)
RDW: 14.5 % (ref 11.5–15.5)
WBC: 3.4 10*3/uL — ABNORMAL LOW (ref 4.0–10.5)

## 2017-07-22 LAB — PROTIME-INR
INR: 1.08
PROTHROMBIN TIME: 13.9 s (ref 11.4–15.2)

## 2017-07-22 LAB — ETHANOL: Alcohol, Ethyl (B): <10 mg/dL (ref <10)

## 2017-07-22 LAB — PHOSPHORUS: Phosphorus: 2.1 mg/dL — ABNORMAL LOW (ref 2.5–4.6)

## 2017-07-22 LAB — GLUCOSE, CAPILLARY: GLUCOSE-CAPILLARY: 158 mg/dL — AB (ref 65–99)

## 2017-07-22 LAB — MAGNESIUM: MAGNESIUM: 1.9 mg/dL (ref 1.7–2.4)

## 2017-07-22 LAB — ACETAMINOPHEN LEVEL

## 2017-07-22 LAB — MRSA PCR SCREENING: MRSA by PCR: NEGATIVE

## 2017-07-22 MED ORDER — POTASSIUM CHLORIDE 10 MEQ/100ML IV SOLN
10.0000 meq | INTRAVENOUS | Status: AC
  Administered 2017-07-22 (×5): 10 meq via INTRAVENOUS
  Filled 2017-07-22 (×5): qty 100

## 2017-07-22 MED ORDER — MAGNESIUM SULFATE 4 GM/100ML IV SOLN
4.0000 g | Freq: Once | INTRAVENOUS | Status: AC
  Administered 2017-07-22: 4 g via INTRAVENOUS
  Filled 2017-07-22: qty 100

## 2017-07-22 MED ORDER — CITALOPRAM HYDROBROMIDE 20 MG PO TABS
40.0000 mg | ORAL_TABLET | Freq: Every day | ORAL | Status: DC
  Administered 2017-07-22: 40 mg via ORAL
  Filled 2017-07-22: qty 2

## 2017-07-22 MED ORDER — POTASSIUM CHLORIDE CRYS ER 20 MEQ PO TBCR
40.0000 meq | EXTENDED_RELEASE_TABLET | Freq: Once | ORAL | Status: AC
  Administered 2017-07-22: 40 meq via ORAL
  Filled 2017-07-22: qty 2

## 2017-07-22 MED ORDER — LORAZEPAM 1 MG PO TABS: 0.0000 mg | ORAL_TABLET | Freq: Two times a day (BID) | ORAL | Status: DC

## 2017-07-22 MED ORDER — SODIUM CHLORIDE 0.9 % IV SOLN
INTRAVENOUS | Status: DC
  Administered 2017-07-23 (×2): via INTRAVENOUS

## 2017-07-22 MED ORDER — ADULT MULTIVITAMIN W/MINERALS CH
1.0000 | ORAL_TABLET | Freq: Every day | ORAL | Status: DC
  Administered 2017-07-22 – 2017-07-23 (×2): 1 via ORAL
  Filled 2017-07-22 (×2): qty 1

## 2017-07-22 MED ORDER — POTASSIUM CHLORIDE CRYS ER 20 MEQ PO TBCR
40.0000 meq | EXTENDED_RELEASE_TABLET | Freq: Once | ORAL | Status: AC
Start: 2017-07-22 — End: 2017-07-22
  Administered 2017-07-22: 40 meq via ORAL
  Filled 2017-07-22: qty 2

## 2017-07-22 MED ORDER — K PHOS MONO-SOD PHOS DI & MONO 155-852-130 MG PO TABS: 250.0000 mg | ORAL_TABLET | Freq: Three times a day (TID) | ORAL | Status: DC

## 2017-07-22 MED ORDER — CITALOPRAM HYDROBROMIDE 20 MG PO TABS
20.0000 mg | ORAL_TABLET | Freq: Every day | ORAL | Status: DC
  Administered 2017-07-23: 20 mg via ORAL
  Filled 2017-07-22: qty 1

## 2017-07-22 MED ORDER — LEVETIRACETAM 500 MG PO TABS
1000.0000 mg | ORAL_TABLET | Freq: Two times a day (BID) | ORAL | Status: DC
  Administered 2017-07-22 – 2017-07-23 (×3): 1000 mg via ORAL
  Filled 2017-07-22 (×3): qty 2

## 2017-07-22 MED ORDER — ACETAMINOPHEN 325 MG PO TABS: 650.0000 mg | ORAL_TABLET | Freq: Four times a day (QID) | ORAL | Status: DC | PRN

## 2017-07-22 MED ORDER — IBUPROFEN 200 MG PO TABS: 400.0000 mg | ORAL_TABLET | Freq: Four times a day (QID) | ORAL | Status: DC | PRN

## 2017-07-22 MED ORDER — ACETAMINOPHEN 650 MG RE SUPP: 650.0000 mg | Freq: Four times a day (QID) | RECTAL | Status: DC | PRN

## 2017-07-22 MED ORDER — K PHOS MONO-SOD PHOS DI & MONO 155-852-130 MG PO TABS
250.0000 mg | ORAL_TABLET | Freq: Three times a day (TID) | ORAL | Status: AC
  Administered 2017-07-22 (×3): 250 mg via ORAL
  Filled 2017-07-22 (×4): qty 1

## 2017-07-22 MED ORDER — AMLODIPINE BESYLATE 5 MG PO TABS: 5.0000 mg | ORAL_TABLET | Freq: Every day | ORAL | Status: DC

## 2017-07-22 MED ORDER — LORAZEPAM 1 MG PO TABS
0.0000 mg | ORAL_TABLET | Freq: Four times a day (QID) | ORAL | Status: DC
  Administered 2017-07-22 (×3): 1 mg via ORAL
  Filled 2017-07-22 (×3): qty 1

## 2017-07-22 MED ORDER — SODIUM CHLORIDE 0.9% FLUSH
3.0000 mL | Freq: Two times a day (BID) | INTRAVENOUS | Status: DC
  Administered 2017-07-22 (×2): 3 mL via INTRAVENOUS

## 2017-07-22 MED ORDER — FOLIC ACID 1 MG PO TABS
1.0000 mg | ORAL_TABLET | Freq: Every day | ORAL | Status: DC
  Administered 2017-07-22 – 2017-07-23 (×2): 1 mg via ORAL
  Filled 2017-07-22 (×2): qty 1

## 2017-07-22 MED ORDER — FAMOTIDINE IN NACL 20-0.9 MG/50ML-% IV SOLN
20.0000 mg | Freq: Two times a day (BID) | INTRAVENOUS | Status: DC
  Administered 2017-07-22 (×2): 20 mg via INTRAVENOUS
  Filled 2017-07-22 (×2): qty 50

## 2017-07-22 MED ORDER — VITAMIN B-1 100 MG PO TABS
100.0000 mg | ORAL_TABLET | Freq: Every day | ORAL | Status: DC
  Administered 2017-07-22 – 2017-07-23 (×2): 100 mg via ORAL
  Filled 2017-07-22 (×2): qty 1

## 2017-07-22 MED ORDER — AMLODIPINE BESYLATE 10 MG PO TABS
10.0000 mg | ORAL_TABLET | Freq: Every day | ORAL | Status: DC
  Filled 2017-07-22: qty 1

## 2017-07-22 MED ORDER — FAMOTIDINE 20 MG PO TABS
20.0000 mg | ORAL_TABLET | Freq: Two times a day (BID) | ORAL | Status: DC
  Administered 2017-07-22 – 2017-07-23 (×2): 20 mg via ORAL
  Filled 2017-07-22 (×2): qty 1

## 2017-07-22 MED ORDER — HYDRALAZINE HCL 20 MG/ML IJ SOLN
10.0000 mg | INTRAMUSCULAR | Status: DC | PRN
  Administered 2017-07-22: 10 mg via INTRAVENOUS
  Filled 2017-07-22: qty 1

## 2017-07-22 MED ORDER — SODIUM CHLORIDE 0.9 % IV SOLN
INTRAVENOUS | Status: DC
  Administered 2017-07-22: 08:00:00 via INTRAVENOUS

## 2017-07-22 MED ORDER — LORAZEPAM 2 MG/ML IJ SOLN
2.0000 mg | INTRAMUSCULAR | Status: DC | PRN
  Administered 2017-07-22: 2 mg via INTRAVENOUS
  Filled 2017-07-22: qty 1

## 2017-07-22 NOTE — Care Management Note (Signed)
Case Management Note  Patient Details  Name: Susan Francis MRN: [id number] Date of Birth: [birth year]  Subjective/Objective:                  etoh w/d and ciwa protocol Action/Plan: Date: July 22, 2017 Susan Francis, BSN, [place], [place] Chart and notes review for patient progress and needs. Will follow for case management and discharge needs. Next review date: [id number]  Expected Discharge Date:                  Expected Discharge Plan:  Home/Self Care  In-House Referral:     Discharge planning Services  CM Consult  Post Acute Care Choice:    Choice offered to:     DME Arranged:    DME Agency:     HH Arranged:    HH Agency:     Status of Service:  In process, will continue to follow  If discussed at Long Length of Stay Meetings, dates discussed:    Additional Comments:  Susan Cha, RN 07/22/2017, 8:31 AM

## 2017-07-22 NOTE — ED Notes (Signed)
Assigned @0202  room 1232
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
## 2017-07-22 NOTE — ED Notes (Signed)
Bed assignment changed to 1234
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
## 2017-07-22 NOTE — Progress Notes (Signed)
Patient arrived to unit at [facility]. Patient alert and oriented. VSS. Assessment is unchanged from previous RN.
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
## 2017-07-23 DIAGNOSIS — F10239 Alcohol dependence with withdrawal, unspecified: Secondary | ICD-10-CM | POA: Diagnosis not present

## 2017-07-23 LAB — CBC WITH DIFFERENTIAL/PLATELET
BASOS ABS: 0 10*3/uL (ref 0.0–0.1)
BASOS PCT: 0 %
EOS ABS: 0.1 10*3/uL (ref 0.0–0.7)
EOS PCT: 2 %
HCT: 41.7 % (ref 36.0–46.0)
Hemoglobin: 14.4 g/dL (ref 12.0–15.0)
Lymphocytes Relative: 33 %
Lymphs Abs: 1 10*3/uL (ref 0.7–4.0)
MCH: 33.6 pg (ref 26.0–34.0)
MCHC: 34.5 g/dL (ref 30.0–36.0)
MCV: 97.4 fL (ref 78.0–100.0)
MONO ABS: 0.3 10*3/uL (ref 0.1–1.0)
MONOS PCT: 9 %
NEUTROS ABS: 1.7 10*3/uL (ref 1.7–7.7)
Neutrophils Relative %: 56 %
PLATELETS: 49 10*3/uL — AB (ref 150–400)
RBC: 4.28 MIL/uL (ref 3.87–5.11)
RDW: 15.1 % (ref 11.5–15.5)
WBC: 3.1 10*3/uL — ABNORMAL LOW (ref 4.0–10.5)

## 2017-07-23 LAB — COMPREHENSIVE METABOLIC PANEL
ALBUMIN: 3.5 g/dL (ref 3.5–5.0)
ALK PHOS: 71 U/L (ref 38–126)
ALT: 22 U/L (ref 14–54)
ANION GAP: 6 (ref 5–15)
AST: 35 U/L (ref 15–41)
BILIRUBIN TOTAL: 1.4 mg/dL — AB (ref 0.3–1.2)
BUN: <5 mg/dL — ABNORMAL LOW (ref 6–20)
CALCIUM: 7.4 mg/dL — AB (ref 8.9–10.3)
CO2: 25 mmol/L (ref 22–32)
CREATININE: 0.68 mg/dL (ref 0.44–1.00)
Chloride: 108 mmol/L (ref 101–111)
GFR calc Af Amer: >60 mL/min (ref >60)
GFR calc non Af Amer: >60 mL/min (ref >60)
GLUCOSE: 104 mg/dL — AB (ref 65–99)
Potassium: 3.1 mmol/L — ABNORMAL LOW (ref 3.5–5.1)
Sodium: 139 mmol/L (ref 135–145)
TOTAL PROTEIN: 5.4 g/dL — AB (ref 6.5–8.1)

## 2017-07-23 LAB — MAGNESIUM: Magnesium: 2.2 mg/dL (ref 1.7–2.4)

## 2017-07-23 LAB — PHOSPHORUS: PHOSPHORUS: 2.3 mg/dL — AB (ref 2.5–4.6)

## 2017-07-23 LAB — GLUCOSE, CAPILLARY: GLUCOSE-CAPILLARY: 101 mg/dL — AB (ref 65–99)

## 2017-07-23 MED ORDER — POTASSIUM PHOSPHATES 15 MMOLE/5ML IV SOLN
20.0000 mmol | Freq: Once | INTRAVENOUS | Status: AC
  Administered 2017-07-23: 20 mmol via INTRAVENOUS
  Filled 2017-07-23: qty 6.67

## 2017-07-23 MED ORDER — LOPERAMIDE HCL 2 MG PO CAPS
2.0000 mg | ORAL_CAPSULE | Freq: Once | ORAL | Status: AC
  Administered 2017-07-23: 2 mg via ORAL
  Filled 2017-07-23: qty 1

## 2017-07-23 MED ORDER — POTASSIUM CHLORIDE CRYS ER 20 MEQ PO TBCR
40.0000 meq | EXTENDED_RELEASE_TABLET | Freq: Once | ORAL | Status: AC
  Administered 2017-07-23: 40 meq via ORAL
  Filled 2017-07-23: qty 2
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
## 2017-07-23 NOTE — Care Management CC44 (Addendum)
Condition Code 44 Documentation Completed  Patient Details  Name: Susan Francis MRN: [id number] Date of Birth: 1956-06-06   Condition Code 44 given:  Yes Patient signature on Condition Code 44 notice:  Yes Documentation of 2 MD's agreement:  Yes Code 44 added to claim:  Yes    Luella Gardenhire, Benjaman Lobe, RN 07/23/2017, 5:06 PM

## 2017-07-23 NOTE — Plan of Care (Signed)
Plan of care reviewed with patient.  Verbalized u/o same.

## 2017-08-01 DIAGNOSIS — D696 Thrombocytopenia, unspecified: Secondary | ICD-10-CM | POA: Diagnosis not present

## 2017-08-01 DIAGNOSIS — F331 Major depressive disorder, recurrent, moderate: Secondary | ICD-10-CM | POA: Diagnosis not present

## 2017-08-01 DIAGNOSIS — F10239 Alcohol dependence with withdrawal, unspecified: Secondary | ICD-10-CM | POA: Diagnosis not present

## 2017-08-01 DIAGNOSIS — G40909 Epilepsy, unspecified, not intractable, without status epilepticus: Secondary | ICD-10-CM | POA: Diagnosis not present

## 2017-08-01 DIAGNOSIS — Z23 Encounter for immunization: Secondary | ICD-10-CM | POA: Diagnosis not present

## 2017-08-09 ENCOUNTER — Other Ambulatory Visit: Payer: Self-pay | Admitting: Neurology

## 2017-08-09 DIAGNOSIS — G40201 Localization-related (focal) (partial) symptomatic epilepsy and epileptic syndromes with complex partial seizures, not intractable, with status epilepticus: Secondary | ICD-10-CM

## 2017-08-21 ENCOUNTER — Telehealth: Payer: Self-pay | Admitting: Hematology

## 2017-08-21 NOTE — Telephone Encounter (Signed)
Spoke with patient and Referring MD's office all info regarding appointment was given.. D/T/Loc/Phone#

## 2017-08-30 ENCOUNTER — Encounter (HOSPITAL_COMMUNITY): Payer: Self-pay | Admitting: Emergency Medicine

## 2017-08-30 ENCOUNTER — Emergency Department (HOSPITAL_COMMUNITY)
Admission: EM | Admit: 2017-08-30 | Discharge: 2017-08-30 | Disposition: A | Discharge status: Home / Self Care | Payer: Medicare HMO | Attending: Emergency Medicine | Admitting: Emergency Medicine

## 2017-08-30 DIAGNOSIS — S0181XA Laceration without foreign body of other part of head, initial encounter: Secondary | ICD-10-CM

## 2017-08-30 DIAGNOSIS — W010XXA Fall on same level from slipping, tripping and stumbling without subsequent striking against object, initial encounter: Secondary | ICD-10-CM | POA: Insufficient documentation

## 2017-08-30 DIAGNOSIS — Y999 Unspecified external cause status: Secondary | ICD-10-CM | POA: Diagnosis not present

## 2017-08-30 DIAGNOSIS — Z23 Encounter for immunization: Secondary | ICD-10-CM | POA: Diagnosis not present

## 2017-08-30 DIAGNOSIS — Y929 Unspecified place or not applicable: Secondary | ICD-10-CM | POA: Diagnosis not present

## 2017-08-30 DIAGNOSIS — S01111A Laceration without foreign body of right eyelid and periocular area, initial encounter: Secondary | ICD-10-CM | POA: Insufficient documentation

## 2017-08-30 DIAGNOSIS — W19XXXA Unspecified fall, initial encounter: Secondary | ICD-10-CM

## 2017-08-30 DIAGNOSIS — Z79899 Other long term (current) drug therapy: Secondary | ICD-10-CM | POA: Diagnosis not present

## 2017-08-30 DIAGNOSIS — F17210 Nicotine dependence, cigarettes, uncomplicated: Secondary | ICD-10-CM | POA: Insufficient documentation

## 2017-08-30 DIAGNOSIS — S01112A Laceration without foreign body of left eyelid and periocular area, initial encounter: Secondary | ICD-10-CM | POA: Diagnosis not present

## 2017-08-30 DIAGNOSIS — Y939 Activity, unspecified: Secondary | ICD-10-CM | POA: Diagnosis not present

## 2017-08-30 MED ORDER — TETANUS-DIPHTH-ACELL PERTUSSIS 5-2.5-18.5 LF-MCG/0.5 IM SUSP
0.5000 mL | Freq: Once | INTRAMUSCULAR | Status: AC
  Administered 2017-08-30: 0.5 mL via INTRAMUSCULAR
  Filled 2017-08-30: qty 0.5

## 2017-08-30 MED ORDER — LIDOCAINE HCL 2 % IJ SOLN
10.0000 mL | Freq: Once | INTRAMUSCULAR | Status: AC
  Administered 2017-08-30: 200 mg
  Filled 2017-08-30: qty 20
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
## 2017-08-30 NOTE — ED Triage Notes (Signed)
Pt reports she tripped and fell over her husbands oxygen tubing in the kitchen, hit her head on the floor. Pt has 2 lacs above her R eye. Wounds cleaned and wrapped with gauze. Tetanus not up to date.

## 2017-08-30 NOTE — Discharge Instructions (Signed)
It was my pleasure taking care of you today!   Keep wound clean with mild soap and water. Keep area covered with a topical antibiotic ointment and bandage, keep bandage dry, and do not submerge in water for 24 hours. Ice and elevate for additional pain relief and swelling. Alternate between ibuprofen and Tylenol for additional pain relief. Your sutures are dissolvable. They should fall out within 5 days. If they have not fallen out, take a warm wash cloth with Vaseline or petroleum jelly ointment and gently rub. The sutures should come out. If not, you can see your primary care doctor or an urgent care for suture removal. Monitor area for signs of infection to include, but not limited to: increasing pain, spreading redness, drainage/pus, worsening swelling, or fevers. Return to emergency department for emergent changing or worsening symptoms.

## 2017-09-09 ENCOUNTER — Encounter: Payer: Self-pay | Admitting: Hematology

## 2017-10-08 DIAGNOSIS — G40909 Epilepsy, unspecified, not intractable, without status epilepticus: Secondary | ICD-10-CM | POA: Diagnosis not present

## 2017-10-08 DIAGNOSIS — K7460 Unspecified cirrhosis of liver: Secondary | ICD-10-CM | POA: Diagnosis not present

## 2017-10-08 DIAGNOSIS — Z1389 Encounter for screening for other disorder: Secondary | ICD-10-CM | POA: Diagnosis not present

## 2017-10-08 DIAGNOSIS — Z0000 Encounter for general adult medical examination without abnormal findings: Secondary | ICD-10-CM | POA: Diagnosis not present

## 2017-10-08 DIAGNOSIS — F329 Major depressive disorder, single episode, unspecified: Secondary | ICD-10-CM | POA: Diagnosis not present

## 2017-10-08 DIAGNOSIS — F1010 Alcohol abuse, uncomplicated: Secondary | ICD-10-CM | POA: Diagnosis not present

## 2017-10-08 DIAGNOSIS — K7030 Alcoholic cirrhosis of liver without ascites: Secondary | ICD-10-CM | POA: Diagnosis not present

## 2017-10-08 DIAGNOSIS — Z1239 Encounter for other screening for malignant neoplasm of breast: Secondary | ICD-10-CM | POA: Diagnosis not present

## 2017-10-08 DIAGNOSIS — E039 Hypothyroidism, unspecified: Secondary | ICD-10-CM | POA: Diagnosis not present

## 2017-10-08 DIAGNOSIS — Z79899 Other long term (current) drug therapy: Secondary | ICD-10-CM | POA: Diagnosis not present

## 2017-11-24 ENCOUNTER — Ambulatory Visit
Admission: RE | Admit: 2017-11-24 | Discharge: 2017-11-24 | Disposition: A | Discharge status: Home / Self Care | Payer: Medicare HMO | Source: Ambulatory Visit | Attending: Internal Medicine | Admitting: Internal Medicine

## 2017-11-24 ENCOUNTER — Other Ambulatory Visit: Payer: Self-pay | Admitting: Internal Medicine

## 2017-11-24 DIAGNOSIS — F1020 Alcohol dependence, uncomplicated: Secondary | ICD-10-CM | POA: Diagnosis not present

## 2017-11-24 DIAGNOSIS — S0083XA Contusion of other part of head, initial encounter: Secondary | ICD-10-CM

## 2017-11-24 DIAGNOSIS — W19XXXA Unspecified fall, initial encounter: Secondary | ICD-10-CM | POA: Diagnosis not present

## 2017-11-24 DIAGNOSIS — F4321 Adjustment disorder with depressed mood: Secondary | ICD-10-CM | POA: Diagnosis not present
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
## 2018-01-27 ENCOUNTER — Ambulatory Visit (HOSPITAL_COMMUNITY): Payer: Medicare HMO | Admitting: Psychiatry

## 2018-03-31 DIAGNOSIS — F1020 Alcohol dependence, uncomplicated: Secondary | ICD-10-CM | POA: Diagnosis not present

## 2018-03-31 DIAGNOSIS — Z79899 Other long term (current) drug therapy: Secondary | ICD-10-CM | POA: Diagnosis not present

## 2018-03-31 DIAGNOSIS — F339 Major depressive disorder, recurrent, unspecified: Secondary | ICD-10-CM | POA: Diagnosis not present

## 2018-05-20 ENCOUNTER — Other Ambulatory Visit: Payer: Self-pay | Admitting: Neurology

## 2018-05-20 DIAGNOSIS — G40201 Localization-related (focal) (partial) symptomatic epilepsy and epileptic syndromes with complex partial seizures, not intractable, with status epilepticus: Secondary | ICD-10-CM

## 2018-06-10 ENCOUNTER — Telehealth: Payer: Self-pay | Admitting: Neurology

## 2018-06-10 DIAGNOSIS — G40201 Localization-related (focal) (partial) symptomatic epilepsy and epileptic syndromes with complex partial seizures, not intractable, with status epilepticus: Secondary | ICD-10-CM

## 2018-06-10 MED ORDER — LEVETIRACETAM 1000 MG PO TABS: 1000.0000 mg | ORAL_TABLET | Freq: Two times a day (BID) | ORAL | 3 refills | Status: DC

## 2018-06-10 NOTE — Telephone Encounter (Signed)
Patient called and is needing to have her Keppra (generic) called into [facility] now instead of [facility]? She normally gets a 90 Day suppply but this time she will get a 30 day supply. She is scheduled for January but would like to know can that medication be filled until then? Thanks

## 2018-06-10 NOTE — Telephone Encounter (Signed)
Rx Keppra 1000mg  #60 with 3 refills sig = 1 tablet BID sent to [facility] on [address].

## 2018-06-19 ENCOUNTER — Emergency Department (HOSPITAL_COMMUNITY)
Admission: EM | Admit: 2018-06-19 | Discharge: 2018-06-19 | Disposition: A | Discharge status: Home / Self Care | Payer: Medicare HMO | Attending: Emergency Medicine | Admitting: Emergency Medicine

## 2018-06-19 DIAGNOSIS — R569 Unspecified convulsions: Secondary | ICD-10-CM

## 2018-06-19 DIAGNOSIS — F17210 Nicotine dependence, cigarettes, uncomplicated: Secondary | ICD-10-CM | POA: Diagnosis not present

## 2018-06-19 DIAGNOSIS — R58 Hemorrhage, not elsewhere classified: Secondary | ICD-10-CM | POA: Diagnosis not present

## 2018-06-19 DIAGNOSIS — G40909 Epilepsy, unspecified, not intractable, without status epilepticus: Secondary | ICD-10-CM | POA: Diagnosis not present

## 2018-06-19 LAB — BASIC METABOLIC PANEL
Anion gap: 12 (ref 5–15)
BUN: 16 mg/dL (ref 8–23)
CO2: 25 mmol/L (ref 22–32)
Calcium: 9.5 mg/dL (ref 8.9–10.3)
Chloride: 105 mmol/L (ref 98–111)
Creatinine, Ser: 0.86 mg/dL (ref 0.44–1.00)
GFR calc Af Amer: >60 mL/min (ref >60)
GFR calc non Af Amer: >60 mL/min (ref >60)
Glucose, Bld: 119 mg/dL — ABNORMAL HIGH (ref 70–99)
POTASSIUM: 3.8 mmol/L (ref 3.5–5.1)
SODIUM: 142 mmol/L (ref 135–145)

## 2018-06-19 LAB — CBC
HCT: 52.7 % — ABNORMAL HIGH (ref 36.0–46.0)
Hemoglobin: 17.5 g/dL — ABNORMAL HIGH (ref 12.0–15.0)
MCH: 32.3 pg (ref 26.0–34.0)
MCHC: 33.2 g/dL (ref 30.0–36.0)
MCV: 97.4 fL (ref 80.0–100.0)
PLATELETS: 96 10*3/uL — AB (ref 150–400)
RBC: 5.41 MIL/uL — AB (ref 3.87–5.11)
RDW: 14.6 % (ref 11.5–15.5)
WBC: 8.1 10*3/uL (ref 4.0–10.5)
nRBC: 0 % (ref 0.0–0.2)

## 2018-06-19 MED ORDER — SODIUM CHLORIDE 0.9 % IV BOLUS
1000.0000 mL | Freq: Once | INTRAVENOUS | Status: AC
  Administered 2018-06-19: 999 mL via INTRAVENOUS

## 2018-06-19 MED ORDER — LEVETIRACETAM IN NACL 1000 MG/100ML IV SOLN
1000.0000 mg | Freq: Once | INTRAVENOUS | Status: AC
  Administered 2018-06-19: 1000 mg via INTRAVENOUS
  Filled 2018-06-19: qty 100

## 2018-06-19 MED ORDER — ACETAMINOPHEN 325 MG PO TABS
650.0000 mg | ORAL_TABLET | Freq: Once | ORAL | Status: AC
  Administered 2018-06-19: 650 mg via ORAL
  Filled 2018-06-19: qty 2

## 2018-06-19 NOTE — ED Notes (Signed)
Bed: WA06 Expected date:  Expected time:  Means of arrival:  Comments: EMS- 62yo F, seizures

## 2018-06-19 NOTE — ED Triage Notes (Signed)
Transported by [facility] from home--witnessed seizure that occurred this morning lasting approximately 5 min. Hx of seizures, last seizure occurred last week . +oral trauma, post-ictal, urinary incontinence. Patient also reports recent ETOH intake as of last night. Prescribed Keppra, patient reports compliance.
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
## 2018-08-24 DIAGNOSIS — F10239 Alcohol dependence with withdrawal, unspecified: Secondary | ICD-10-CM | POA: Diagnosis not present

## 2018-08-24 DIAGNOSIS — F1020 Alcohol dependence, uncomplicated: Secondary | ICD-10-CM | POA: Diagnosis not present

## 2018-08-26 DIAGNOSIS — F1020 Alcohol dependence, uncomplicated: Secondary | ICD-10-CM | POA: Diagnosis not present

## 2018-08-26 DIAGNOSIS — F10239 Alcohol dependence with withdrawal, unspecified: Secondary | ICD-10-CM | POA: Diagnosis not present

## 2018-08-27 DIAGNOSIS — F10239 Alcohol dependence with withdrawal, unspecified: Secondary | ICD-10-CM | POA: Diagnosis not present

## 2018-08-27 DIAGNOSIS — F1020 Alcohol dependence, uncomplicated: Secondary | ICD-10-CM | POA: Diagnosis not present

## 2018-08-28 DIAGNOSIS — F10239 Alcohol dependence with withdrawal, unspecified: Secondary | ICD-10-CM | POA: Diagnosis not present

## 2018-08-28 DIAGNOSIS — F1020 Alcohol dependence, uncomplicated: Secondary | ICD-10-CM | POA: Diagnosis not present

## 2018-08-29 DIAGNOSIS — F10239 Alcohol dependence with withdrawal, unspecified: Secondary | ICD-10-CM | POA: Diagnosis not present

## 2018-08-29 DIAGNOSIS — F1020 Alcohol dependence, uncomplicated: Secondary | ICD-10-CM | POA: Diagnosis not present

## 2018-08-30 DIAGNOSIS — F10239 Alcohol dependence with withdrawal, unspecified: Secondary | ICD-10-CM | POA: Diagnosis not present

## 2018-08-30 DIAGNOSIS — F1020 Alcohol dependence, uncomplicated: Secondary | ICD-10-CM | POA: Diagnosis not present

## 2018-08-31 DIAGNOSIS — F10239 Alcohol dependence with withdrawal, unspecified: Secondary | ICD-10-CM | POA: Diagnosis not present

## 2018-08-31 DIAGNOSIS — F1020 Alcohol dependence, uncomplicated: Secondary | ICD-10-CM | POA: Diagnosis not present

## 2018-09-01 DIAGNOSIS — F1020 Alcohol dependence, uncomplicated: Secondary | ICD-10-CM | POA: Diagnosis not present

## 2018-09-01 DIAGNOSIS — F10239 Alcohol dependence with withdrawal, unspecified: Secondary | ICD-10-CM | POA: Diagnosis not present

## 2018-09-02 DIAGNOSIS — F10239 Alcohol dependence with withdrawal, unspecified: Secondary | ICD-10-CM | POA: Diagnosis not present

## 2018-09-02 DIAGNOSIS — F1020 Alcohol dependence, uncomplicated: Secondary | ICD-10-CM | POA: Diagnosis not present

## 2018-09-03 DIAGNOSIS — F1020 Alcohol dependence, uncomplicated: Secondary | ICD-10-CM | POA: Diagnosis not present

## 2018-09-03 DIAGNOSIS — F10239 Alcohol dependence with withdrawal, unspecified: Secondary | ICD-10-CM | POA: Diagnosis not present

## 2018-09-04 ENCOUNTER — Encounter: Payer: Self-pay | Admitting: Neurology

## 2018-09-04 ENCOUNTER — Ambulatory Visit: Payer: Self-pay | Admitting: Neurology

## 2018-09-04 ENCOUNTER — Encounter

## 2018-09-04 DIAGNOSIS — F1020 Alcohol dependence, uncomplicated: Secondary | ICD-10-CM | POA: Diagnosis not present

## 2018-09-04 DIAGNOSIS — Z029 Encounter for administrative examinations, unspecified: Secondary | ICD-10-CM

## 2018-09-04 DIAGNOSIS — F10239 Alcohol dependence with withdrawal, unspecified: Secondary | ICD-10-CM | POA: Diagnosis not present

## 2018-09-05 DIAGNOSIS — F1020 Alcohol dependence, uncomplicated: Secondary | ICD-10-CM | POA: Diagnosis not present

## 2018-09-05 DIAGNOSIS — F10239 Alcohol dependence with withdrawal, unspecified: Secondary | ICD-10-CM | POA: Diagnosis not present

## 2018-09-06 DIAGNOSIS — F1020 Alcohol dependence, uncomplicated: Secondary | ICD-10-CM | POA: Diagnosis not present

## 2018-09-06 DIAGNOSIS — F10239 Alcohol dependence with withdrawal, unspecified: Secondary | ICD-10-CM | POA: Diagnosis not present

## 2018-09-07 DIAGNOSIS — F10239 Alcohol dependence with withdrawal, unspecified: Secondary | ICD-10-CM | POA: Diagnosis not present

## 2018-09-07 DIAGNOSIS — F1020 Alcohol dependence, uncomplicated: Secondary | ICD-10-CM | POA: Diagnosis not present

## 2018-09-08 DIAGNOSIS — F10239 Alcohol dependence with withdrawal, unspecified: Secondary | ICD-10-CM | POA: Diagnosis not present

## 2018-09-08 DIAGNOSIS — F1020 Alcohol dependence, uncomplicated: Secondary | ICD-10-CM | POA: Diagnosis not present

## 2018-09-09 DIAGNOSIS — F10239 Alcohol dependence with withdrawal, unspecified: Secondary | ICD-10-CM | POA: Diagnosis not present

## 2018-09-09 DIAGNOSIS — F1020 Alcohol dependence, uncomplicated: Secondary | ICD-10-CM | POA: Diagnosis not present

## 2018-10-29 ENCOUNTER — Other Ambulatory Visit: Payer: Self-pay | Admitting: Neurology

## 2018-10-29 ENCOUNTER — Telehealth: Payer: Self-pay | Admitting: Neurology

## 2018-10-29 DIAGNOSIS — G40201 Localization-related (focal) (partial) symptomatic epilepsy and epileptic syndromes with complex partial seizures, not intractable, with status epilepticus: Secondary | ICD-10-CM

## 2018-10-29 MED ORDER — LEVETIRACETAM 1000 MG PO TABS: 1000.0000 mg | ORAL_TABLET | Freq: Two times a day (BID) | ORAL | 0 refills | Status: DC

## 2018-10-29 NOTE — Telephone Encounter (Signed)
Keppra 1000mg  #120 with no refills Sig = Take 1 tab BID  Sent to [facility] on [address].   This is enough to last until pt's April appointment.  New Rx will be sent then.

## 2018-10-29 NOTE — Telephone Encounter (Signed)
Patient is calling in needing the keppra medication sent in to [facility] on file. She scheduled an appt for April. Thanks!
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
## 2018-11-20 ENCOUNTER — Telehealth: Payer: Self-pay | Admitting: Neurology

## 2018-11-20 NOTE — Telephone Encounter (Signed)
Patient is calling in again. Thanks!

## 2018-11-20 NOTE — Telephone Encounter (Signed)
Patient is calling in about Keppra 1000mg  x2s a day and is wanting some samples if you have some. Doesn't have money to buy her prescription. Please call her and let her know. Thanks!

## 2018-11-25 NOTE — Telephone Encounter (Signed)
Attempted to call pt to let her know that we do not carry these samples in the office. Received message that call could not be completed at this time and to try again later.  x2 calls.   The only option I can think of for pt is [email or web address] with card.

## 2018-11-28 ENCOUNTER — Other Ambulatory Visit: Payer: Self-pay | Admitting: Neurology

## 2018-11-28 DIAGNOSIS — G40201 Localization-related (focal) (partial) symptomatic epilepsy and epileptic syndromes with complex partial seizures, not intractable, with status epilepticus: Secondary | ICD-10-CM

## 2018-12-02 ENCOUNTER — Telehealth: Payer: Self-pay | Admitting: Neurology

## 2018-12-02 NOTE — Telephone Encounter (Signed)
Patient does not have a computer or smart phone to a e visit

## 2018-12-09 ENCOUNTER — Ambulatory Visit: Payer: Self-pay | Admitting: Neurology

## 2019-02-18 ENCOUNTER — Telehealth: Payer: Self-pay | Admitting: Neurology

## 2019-02-18 ENCOUNTER — Other Ambulatory Visit: Payer: Self-pay

## 2019-02-18 DIAGNOSIS — G40201 Localization-related (focal) (partial) symptomatic epilepsy and epileptic syndromes with complex partial seizures, not intractable, with status epilepticus: Secondary | ICD-10-CM

## 2019-02-18 MED ORDER — LEVETIRACETAM 1000 MG PO TABS: ORAL_TABLET | ORAL | 0 refills | Status: DC

## 2019-02-18 NOTE — Telephone Encounter (Signed)
Pt has six pills left. KEPPRA 1000 mg (generic) bid. Please call in to [facility] on [address]. Pt ph [phone].

## 2019-03-02 ENCOUNTER — Telehealth (INDEPENDENT_AMBULATORY_CARE_PROVIDER_SITE_OTHER): Payer: Medicare HMO | Admitting: Neurology

## 2019-03-02 ENCOUNTER — Encounter: Payer: Self-pay | Admitting: Neurology

## 2019-03-02 ENCOUNTER — Other Ambulatory Visit: Payer: Self-pay

## 2019-03-02 VITALS — Ht 62.0 in | Wt 150.0 lb

## 2019-03-02 DIAGNOSIS — G40201 Localization-related (focal) (partial) symptomatic epilepsy and epileptic syndromes with complex partial seizures, not intractable, with status epilepticus: Secondary | ICD-10-CM

## 2019-03-02 DIAGNOSIS — R413 Other amnesia: Secondary | ICD-10-CM

## 2019-03-02 DIAGNOSIS — F1010 Alcohol abuse, uncomplicated: Secondary | ICD-10-CM | POA: Diagnosis not present

## 2019-03-02 MED ORDER — LEVETIRACETAM 1000 MG PO TABS: ORAL_TABLET | ORAL | 3 refills | Status: DC

## 2019-03-02 MED ORDER — FOLIC ACID 1 MG PO TABS: 1.0000 mg | ORAL_TABLET | Freq: Every day | ORAL | 3 refills | Status: DC

## 2019-03-02 MED ORDER — THIAMINE HCL 100 MG PO TABS: 100.0000 mg | ORAL_TABLET | Freq: Every day | ORAL | 3 refills | Status: DC
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
## 2019-03-04 ENCOUNTER — Other Ambulatory Visit: Payer: Self-pay

## 2019-03-12 ENCOUNTER — Other Ambulatory Visit: Payer: Self-pay

## 2019-03-12 DIAGNOSIS — R413 Other amnesia: Secondary | ICD-10-CM

## 2019-04-19 ENCOUNTER — Ambulatory Visit
Admission: RE | Admit: 2019-04-19 | Discharge: 2019-04-19 | Disposition: A | Discharge status: Home / Self Care | Payer: Medicare HMO | Source: Ambulatory Visit | Attending: Neurology | Admitting: Neurology

## 2019-04-19 DIAGNOSIS — E236 Other disorders of pituitary gland: Secondary | ICD-10-CM | POA: Diagnosis not present

## 2019-04-19 DIAGNOSIS — R413 Other amnesia: Secondary | ICD-10-CM

## 2019-04-19 DIAGNOSIS — G40909 Epilepsy, unspecified, not intractable, without status epilepticus: Secondary | ICD-10-CM | POA: Diagnosis not present

## 2019-04-19 DIAGNOSIS — K7030 Alcoholic cirrhosis of liver without ascites: Secondary | ICD-10-CM | POA: Diagnosis not present

## 2019-04-19 DIAGNOSIS — F339 Major depressive disorder, recurrent, unspecified: Secondary | ICD-10-CM | POA: Diagnosis not present

## 2019-04-19 DIAGNOSIS — E039 Hypothyroidism, unspecified: Secondary | ICD-10-CM | POA: Diagnosis not present

## 2019-04-19 DIAGNOSIS — F1010 Alcohol abuse, uncomplicated: Secondary | ICD-10-CM | POA: Diagnosis not present

## 2019-04-19 DIAGNOSIS — Z79899 Other long term (current) drug therapy: Secondary | ICD-10-CM | POA: Diagnosis not present

## 2019-04-19 DIAGNOSIS — G4700 Insomnia, unspecified: Secondary | ICD-10-CM | POA: Diagnosis not present

## 2019-04-19 DIAGNOSIS — D696 Thrombocytopenia, unspecified: Secondary | ICD-10-CM | POA: Diagnosis not present

## 2019-04-19 MED ORDER — GADOBENATE DIMEGLUMINE 529 MG/ML IV SOLN
15.0000 mL | Freq: Once | INTRAVENOUS | Status: AC | PRN
  Administered 2019-04-19: 15 mL via INTRAVENOUS

## 2019-04-20 ENCOUNTER — Telehealth: Payer: Self-pay

## 2019-04-21 ENCOUNTER — Other Ambulatory Visit: Payer: Self-pay

## 2019-04-21 ENCOUNTER — Telehealth: Payer: Self-pay

## 2019-04-21 NOTE — Telephone Encounter (Signed)
-----   Message from Cameron Sprang, MD sent at 04/21/2019  8:39 AM EDT ----- Pls let patient know the MRI brain did not show any evidence of tumor, stroke, or bleed. It showed age-related changes. The changes seen in her pituitary gland are better and less compared to prior MRI in 2016. Thanks

## 2019-04-21 NOTE — Patient Outreach (Signed)
[facility]) Care Management  04/21/2019  JOCIE MERONEY 1956-06-13 [id number]   Social work referral received from [facility], Quinn Plowman, to assist patient with transportation resources.  Unsuccessful outreach to patient today.  Left voicemail message.  Will attempt to reach again within four business days.  Ronn Melena, BSW Social Worker [phone]

## 2019-04-21 NOTE — Telephone Encounter (Signed)
Pt informed of normal results and that the pituitary gland had improved from previous scan in 2016. Pt understood.

## 2019-04-21 NOTE — Patient Outreach (Addendum)
[facility]) Care Management  04/21/2019  Susan Francis Mar 10, 1956 [id number]  TELEPHONE SCREENING Referral date: 04/20/19 Referral source: Join EMMI referral Referral reason: EMMI score 9 Insurance: [facility] Medicare  Telephone call to patient regarding regarding Join EMMI referral. HIPAA verified. RNCM explained reason for call and discussed and offered [facility] care management services. Patient verbally agreed.   Social:  Patient states she is independent in caring for herself.  She reports having a roommate.    Conditions: Patient reports she does not have transportation to her doctor appointments.   Medications: Patient reports taking her medications as prescribe and states she can afford them   Appointments: Patient reports last appointment with primary MD was 04/19/19  Advance Directives: Patient states she does not have an Advanced directive and declined information at this time.   Consent: Patient gave verbal consent for follow up with [facility] care management social worker   Patient states she is on antidepressant medications and her doctor recently prescribed her Ambien to take every other night for sleep.  Patient states she does not see a psychiatrist or counselor for her depression.  RNCM offered to refer patient to social worker for depression follow up. Patient declined. RNCM will send update to patients primary MD regarding depression screening score.   ASSESSMENT: Patient scored PHQ2 - 6 and PHQ - 9 on depression screening.     PLAN:  RNCM will refer patient to [facility] social worker for transportation assistance.    Quinn Plowman RN,BSN,CCM [facility] Telephonic  [phone]

## 2019-04-22 ENCOUNTER — Ambulatory Visit: Payer: Self-pay

## 2019-04-22 ENCOUNTER — Other Ambulatory Visit: Payer: Self-pay

## 2019-04-22 NOTE — Patient Outreach (Signed)
[name]  04/22/2019  Susan Francis April 09, 1956 [id number]   Social work referral received from [facility], Quinn Plowman, to assist patient with transportation resources.  Unsuccessful outreach to patient today.  Left voicemail message.  Mailed unsuccessful outreach letter. Will attempt to reach again within four business days.  Ronn Melena, BSW Social Worker [phone]

## 2019-04-27 ENCOUNTER — Ambulatory Visit: Payer: Self-pay

## 2019-04-27 ENCOUNTER — Other Ambulatory Visit: Payer: Self-pay

## 2019-04-27 NOTE — Patient Outreach (Signed)
[facility]) Care Management  04/27/2019  Susan Francis 1956-02-19 [id number]  Social work referral received from [facility], Quinn Plowman, to assist patient with transportation resources.  Third unsuccessful outreach to patient today. Left voicemail message. Mailed unsuccessful outreach letter on 04/22/19.  Will close case if no response by 05/04/19  Ronn Melena, [place] Social Worker [phone]

## 2019-05-04 ENCOUNTER — Other Ambulatory Visit: Payer: Self-pay

## 2019-05-04 NOTE — Patient Outreach (Signed)
[facility]) Care Management  05/04/2019  Susan Francis 1956-05-13 [id number]   Closing [facility] Social Work case due to inability to contact.   Ronn Melena, BSW Social Worker [phone]

## 2019-05-19 DIAGNOSIS — Z23 Encounter for immunization: Secondary | ICD-10-CM | POA: Diagnosis not present

## 2019-05-19 DIAGNOSIS — G4700 Insomnia, unspecified: Secondary | ICD-10-CM | POA: Diagnosis not present

## 2019-05-19 DIAGNOSIS — F339 Major depressive disorder, recurrent, unspecified: Secondary | ICD-10-CM | POA: Diagnosis not present

## 2019-05-19 DIAGNOSIS — E039 Hypothyroidism, unspecified: Secondary | ICD-10-CM | POA: Diagnosis not present

## 2019-05-19 DIAGNOSIS — Z1231 Encounter for screening mammogram for malignant neoplasm of breast: Secondary | ICD-10-CM | POA: Diagnosis not present

## 2019-07-01 DIAGNOSIS — Z6830 Body mass index (BMI) 30.0-30.9, adult: Secondary | ICD-10-CM | POA: Diagnosis not present

## 2019-07-01 DIAGNOSIS — Z23 Encounter for immunization: Secondary | ICD-10-CM | POA: Diagnosis not present

## 2019-07-01 DIAGNOSIS — F339 Major depressive disorder, recurrent, unspecified: Secondary | ICD-10-CM | POA: Diagnosis not present

## 2019-07-01 DIAGNOSIS — G4700 Insomnia, unspecified: Secondary | ICD-10-CM | POA: Diagnosis not present

## 2019-07-01 DIAGNOSIS — F1010 Alcohol abuse, uncomplicated: Secondary | ICD-10-CM | POA: Diagnosis not present

## 2019-07-01 DIAGNOSIS — Z0000 Encounter for general adult medical examination without abnormal findings: Secondary | ICD-10-CM | POA: Diagnosis not present

## 2019-07-01 DIAGNOSIS — Z1231 Encounter for screening mammogram for malignant neoplasm of breast: Secondary | ICD-10-CM | POA: Diagnosis not present

## 2019-07-01 DIAGNOSIS — F17200 Nicotine dependence, unspecified, uncomplicated: Secondary | ICD-10-CM | POA: Diagnosis not present

## 2019-07-01 DIAGNOSIS — E039 Hypothyroidism, unspecified: Secondary | ICD-10-CM | POA: Diagnosis not present

## 2019-07-01 DIAGNOSIS — Z1389 Encounter for screening for other disorder: Secondary | ICD-10-CM | POA: Diagnosis not present

## 2019-07-25 ENCOUNTER — Emergency Department (HOSPITAL_COMMUNITY)
Admission: EM | Admit: 2019-07-25 | Discharge: 2019-07-25 | Disposition: A | Discharge status: Home / Self Care | Payer: Medicare HMO | Attending: Emergency Medicine | Admitting: Emergency Medicine

## 2019-07-25 ENCOUNTER — Other Ambulatory Visit: Payer: Self-pay

## 2019-07-25 ENCOUNTER — Encounter (HOSPITAL_COMMUNITY): Payer: Self-pay | Admitting: Emergency Medicine

## 2019-07-25 DIAGNOSIS — E079 Disorder of thyroid, unspecified: Secondary | ICD-10-CM | POA: Insufficient documentation

## 2019-07-25 DIAGNOSIS — Y999 Unspecified external cause status: Secondary | ICD-10-CM | POA: Diagnosis not present

## 2019-07-25 DIAGNOSIS — X58XXXA Exposure to other specified factors, initial encounter: Secondary | ICD-10-CM | POA: Insufficient documentation

## 2019-07-25 DIAGNOSIS — S00512A Abrasion of oral cavity, initial encounter: Secondary | ICD-10-CM | POA: Diagnosis not present

## 2019-07-25 DIAGNOSIS — G4489 Other headache syndrome: Secondary | ICD-10-CM | POA: Diagnosis not present

## 2019-07-25 DIAGNOSIS — J9690 Respiratory failure, unspecified, unspecified whether with hypoxia or hypercapnia: Secondary | ICD-10-CM | POA: Insufficient documentation

## 2019-07-25 DIAGNOSIS — G40909 Epilepsy, unspecified, not intractable, without status epilepticus: Secondary | ICD-10-CM | POA: Diagnosis not present

## 2019-07-25 DIAGNOSIS — R569 Unspecified convulsions: Secondary | ICD-10-CM | POA: Diagnosis not present

## 2019-07-25 DIAGNOSIS — S0993XA Unspecified injury of face, initial encounter: Secondary | ICD-10-CM | POA: Diagnosis not present

## 2019-07-25 DIAGNOSIS — F17210 Nicotine dependence, cigarettes, uncomplicated: Secondary | ICD-10-CM | POA: Diagnosis not present

## 2019-07-25 DIAGNOSIS — Y929 Unspecified place or not applicable: Secondary | ICD-10-CM | POA: Diagnosis not present

## 2019-07-25 DIAGNOSIS — R404 Transient alteration of awareness: Secondary | ICD-10-CM | POA: Diagnosis not present

## 2019-07-25 DIAGNOSIS — R410 Disorientation, unspecified: Secondary | ICD-10-CM | POA: Diagnosis not present

## 2019-07-25 DIAGNOSIS — Y9389 Activity, other specified: Secondary | ICD-10-CM | POA: Diagnosis not present

## 2019-07-25 DIAGNOSIS — Z79899 Other long term (current) drug therapy: Secondary | ICD-10-CM | POA: Insufficient documentation

## 2019-07-25 DIAGNOSIS — G40901 Epilepsy, unspecified, not intractable, with status epilepticus: Secondary | ICD-10-CM | POA: Diagnosis not present

## 2019-07-25 LAB — CBC WITH DIFFERENTIAL/PLATELET
Abs Immature Granulocytes: 0.04 10*3/uL (ref 0.00–0.07)
Basophils Absolute: 0 10*3/uL (ref 0.0–0.1)
Basophils Relative: 0 %
Eosinophils Absolute: 0 10*3/uL (ref 0.0–0.5)
Eosinophils Relative: 0 %
HCT: 48.4 % — ABNORMAL HIGH (ref 36.0–46.0)
Hemoglobin: 16.5 g/dL — ABNORMAL HIGH (ref 12.0–15.0)
Immature Granulocytes: 1 %
Lymphocytes Relative: 11 %
Lymphs Abs: 0.8 10*3/uL (ref 0.7–4.0)
MCH: 31.3 pg (ref 26.0–34.0)
MCHC: 34.1 g/dL (ref 30.0–36.0)
MCV: 91.8 fL (ref 80.0–100.0)
Monocytes Absolute: 0.6 10*3/uL (ref 0.1–1.0)
Monocytes Relative: 8 %
Neutro Abs: 6 10*3/uL (ref 1.7–7.7)
Neutrophils Relative %: 80 %
Platelets: DECREASED 10*3/uL (ref 150–400)
RBC: 5.27 MIL/uL — ABNORMAL HIGH (ref 3.87–5.11)
RDW: 14.1 % (ref 11.5–15.5)
WBC: 7.5 10*3/uL (ref 4.0–10.5)
nRBC: 0 % (ref 0.0–0.2)

## 2019-07-25 LAB — COMPREHENSIVE METABOLIC PANEL
ALT: 21 U/L (ref 0–44)
AST: 31 U/L (ref 15–41)
Albumin: 4 g/dL (ref 3.5–5.0)
Alkaline Phosphatase: 67 U/L (ref 38–126)
Anion gap: 10 (ref 5–15)
BUN: 7 mg/dL — ABNORMAL LOW (ref 8–23)
CO2: 24 mmol/L (ref 22–32)
Calcium: 9 mg/dL (ref 8.9–10.3)
Chloride: 102 mmol/L (ref 98–111)
Creatinine, Ser: 0.97 mg/dL (ref 0.44–1.00)
GFR calc Af Amer: >60 mL/min (ref >60)
GFR calc non Af Amer: >60 mL/min (ref >60)
Glucose, Bld: 119 mg/dL — ABNORMAL HIGH (ref 70–99)
Potassium: 4 mmol/L (ref 3.5–5.1)
Sodium: 136 mmol/L (ref 135–145)
Total Bilirubin: 0.4 mg/dL (ref 0.3–1.2)
Total Protein: 6.7 g/dL (ref 6.5–8.1)

## 2019-07-25 LAB — URINALYSIS, ROUTINE W REFLEX MICROSCOPIC
Bilirubin Urine: NEGATIVE
Glucose, UA: NEGATIVE mg/dL
Hgb urine dipstick: NEGATIVE
Ketones, ur: NEGATIVE mg/dL
Leukocytes,Ua: NEGATIVE
Nitrite: NEGATIVE
Protein, ur: NEGATIVE mg/dL
Specific Gravity, Urine: 1.013 (ref 1.005–1.030)
pH: 7 (ref 5.0–8.0)

## 2019-07-25 MED ORDER — LEVETIRACETAM IN NACL 1000 MG/100ML IV SOLN
1000.0000 mg | Freq: Once | INTRAVENOUS | Status: AC
  Administered 2019-07-25: 1000 mg via INTRAVENOUS
  Filled 2019-07-25: qty 100

## 2019-07-25 NOTE — ED Triage Notes (Signed)
Pt arrives via EMS from home with reports of seizure at 2:30 am. Pt had HA around 6:30 then had another seizure witnessed by roommate. Pt was incontinent and had oral trauma. 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

## 2019-07-25 NOTE — ED Notes (Signed)
Pt able to ambulate with no c/o of pain or dizziness. Nurse notified

## 2019-07-25 NOTE — ED Notes (Signed)
Pt verbalized understanding of discharge paperwork. Pt discharged home with roommate [name]
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
## 2019-07-27 ENCOUNTER — Telehealth: Payer: Medicare HMO | Admitting: Neurology

## 2019-08-04 ENCOUNTER — Other Ambulatory Visit: Payer: Self-pay

## 2019-08-04 ENCOUNTER — Telehealth: Payer: Medicare HMO | Admitting: Neurology

## 2019-08-05 ENCOUNTER — Other Ambulatory Visit: Payer: Self-pay | Admitting: Internal Medicine

## 2019-08-05 DIAGNOSIS — Z1231 Encounter for screening mammogram for malignant neoplasm of breast: Secondary | ICD-10-CM

## 2019-09-24 ENCOUNTER — Ambulatory Visit: Payer: Medicare HMO

## 2019-12-31 ENCOUNTER — Ambulatory Visit: Payer: Medicare HMO | Admitting: Neurology

## 2020-01-28 ENCOUNTER — Ambulatory Visit (INDEPENDENT_AMBULATORY_CARE_PROVIDER_SITE_OTHER): Payer: Medicare HMO | Admitting: Neurology

## 2020-01-28 ENCOUNTER — Other Ambulatory Visit: Payer: Self-pay

## 2020-01-28 ENCOUNTER — Encounter: Payer: Self-pay | Admitting: Neurology

## 2020-01-28 VITALS — BP 101/63 | HR 74 | Ht 62.0 in | Wt 171.0 lb

## 2020-01-28 DIAGNOSIS — G40201 Localization-related (focal) (partial) symptomatic epilepsy and epileptic syndromes with complex partial seizures, not intractable, with status epilepticus: Secondary | ICD-10-CM

## 2020-01-28 DIAGNOSIS — R413 Other amnesia: Secondary | ICD-10-CM | POA: Diagnosis not present

## 2020-01-28 DIAGNOSIS — F329 Major depressive disorder, single episode, unspecified: Secondary | ICD-10-CM

## 2020-01-28 DIAGNOSIS — F5104 Psychophysiologic insomnia: Secondary | ICD-10-CM | POA: Diagnosis not present

## 2020-01-28 DIAGNOSIS — F32A Depression, unspecified: Secondary | ICD-10-CM

## 2020-01-28 MED ORDER — MIRTAZAPINE 15 MG PO TABS: 15.0000 mg | ORAL_TABLET | Freq: Every day | ORAL | 11 refills | Status: DC

## 2020-01-28 MED ORDER — LEVETIRACETAM 1000 MG PO TABS: ORAL_TABLET | ORAL | 3 refills | Status: DC
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
## 2020-01-28 NOTE — Progress Notes (Signed)
6 
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
## 2020-03-01 ENCOUNTER — Encounter: Payer: Self-pay | Admitting: Counselor

## 2020-03-01 ENCOUNTER — Other Ambulatory Visit: Payer: Self-pay

## 2020-03-01 ENCOUNTER — Ambulatory Visit: Payer: Medicare HMO | Admitting: Psychology

## 2020-03-01 ENCOUNTER — Ambulatory Visit (INDEPENDENT_AMBULATORY_CARE_PROVIDER_SITE_OTHER): Payer: Medicare HMO | Admitting: Counselor

## 2020-03-01 DIAGNOSIS — F329 Major depressive disorder, single episode, unspecified: Secondary | ICD-10-CM

## 2020-03-01 DIAGNOSIS — F1011 Alcohol abuse, in remission: Secondary | ICD-10-CM

## 2020-03-01 DIAGNOSIS — F32A Depression, unspecified: Secondary | ICD-10-CM

## 2020-03-01 DIAGNOSIS — F09 Unspecified mental disorder due to known physiological condition: Secondary | ICD-10-CM
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
## 2020-03-01 NOTE — Progress Notes (Signed)
   Psychometrist Note   Cognitive testing was administered to Susan Francis by Milana Kidney, B.S. (Technician) under the supervision of Alphonzo Severance, Psy.D., ABN. [name] was able to tolerate all test procedures. Dr. Nicole Kindred met with the patient as needed to manage any emotional reactions to the testing procedures. Rest breaks were offered.    The battery of tests administered was selected by Dr. Nicole Kindred with consideration to the patient's current level of functioning, the nature of her symptoms, emotional and behavioral responses during the interview, level of literacy, observed level of motivation/effort, and the nature of the referral question. This battery was communicated to the psychometrist. Communication between Dr. Nicole Kindred and the psychometrist was ongoing throughout the evaluation and Dr. Nicole Kindred was immediately accessible at all times. Dr. Nicole Kindred provided supervision to the technician on the date of this service, to the extent necessary to assure the quality of all services provided.    Ms. Kernodle will return in approximately one week for an interactive feedback session with Dr. Nicole Kindred, at which time female test performance, clinical impressions, and treatment recommendations will be reviewed in detail. The patient understands she can contact our office should she require our assistance before this time.   A total of 110 minutes of billable time were spent with Susan Francis by the technician, including test administration and scoring time. Billing for these services is reflected in Dr. Les Pou note.   This note reflects time spent with the psychometrician and does not include test scores, clinical history, or any interpretations made by Dr. Nicole Kindred. The full report will follow in a separate note.

## 2020-03-07 NOTE — Progress Notes (Signed)
NEUROPSYCHOLOGICAL TEST SCORES [facility]  Patient Name: Susan Francis MRN: [id number] Date of Birth: 1955/12/20 Age: 64 y.o. Education: 12 years  Measurement properties of test scores: IQ, Index, and Standard Scores (SS): Mean = 100; Standard Deviation = 15 Scaled Scores (Ss): Mean = 10; Standard Deviation = 3 Z scores (Z): Mean = 0; Standard Deviation = 1 T scores (T); Mean = 50; Standard Deviation = 10  TEST SCORES:    Note: This summary of test scores accompanies the interpretive report and should not be interpreted by unqualified individuals or in isolation without reference to the report. Test scores are relative to age, gender, and educational history as available and appropriate.   Performance Validity        ACS: Raw Descriptor      Word Choice: 47 Within Expectation      The Dot Counting Test: Raw Descriptor      E-Score 12 Within Expectation      Embedded Measures: Raw Descriptor      RBANS Effort Index: 0 Within Expectation      WAIS-IV Reliable Digit Span: 8 Within Expectation      WAIS-IV Reliable Digit Span Revised 11 Within Expectation      Expected Functioning        Wide Range Achievement Test (Word Reading): Standard/Scaled Score Percentile       Word Reading 104 61      Cognitive Testing        RBANS, Form : Standard/Scaled Score Percentile  Total Score 78 7  Immediate Memory 57 <1      List Learning 2 <1      Story Memory 4 2  Visuospatial/Constructional 89 23      Figure Copy   (18) 10 50      Line Orientation --- 10-16  Language 82 12      Picture Naming --- 17-25      Semantic Fluency 3 1  Attention 100 50      Digit Span 14 91      Coding 6 9  Delayed Memory 86 18      List Recall   (3) --- 10-16      List Recognition   (20) --- 51-75      Story Recall   (4) 4 2      Figure Recall   () 5 5      Wechsler Adult Intelligence Scale - IV: Standard/Scaled Score Percentile  Working Memory Index 89 23      Digit Span 9 37           Digit Span Forward 9 37          Digit Span Backward 10 50          Digit Span Sequencing 8 25      Arithmetic 7 16  Processing Speed Index 84 14      Symbol Search 6 9      Coding 8 25      Neuropsychological Assessment Battery (Language Module): T-score Percentile      Naming   (31) 58 79      Verbal Fluency: T-score Percentile      Controlled Oral Word Association (F-A-S) 39 14      Semantic Fluency (Animals) 27 1      Trail Making Test: T-Score Percentile      Part A 44 27      Part B 65 93  [place] Card Sorting Test - 64: T-score Percentile      Categories --- >16 %ile      Total Errors 59 82      Perseverative Errors 61 86      Nonperseverative Errors 50 50      [place] Diagnostic Aphasia Exam: Raw Score Scaled Score      Complex Ideational Material 10 7      Clock Drawing Raw Score Descriptor      Command 9 WNL      Rating Scales         Raw Score Descriptor  Patient Health Questionnaire - 9 15 Moderately-Severe  GAD-7 10 Moderate   Artisha Capri V. Nicole Kindred PsyD, [place] Clinical Neuropsychologist

## 2020-03-09 ENCOUNTER — Other Ambulatory Visit: Payer: Self-pay

## 2020-03-09 ENCOUNTER — Encounter: Payer: Self-pay | Admitting: Counselor

## 2020-03-09 ENCOUNTER — Ambulatory Visit (INDEPENDENT_AMBULATORY_CARE_PROVIDER_SITE_OTHER): Payer: Medicare HMO | Admitting: Counselor

## 2020-03-09 DIAGNOSIS — F418 Other specified anxiety disorders: Secondary | ICD-10-CM

## 2020-03-09 DIAGNOSIS — F1021 Alcohol dependence, in remission: Secondary | ICD-10-CM | POA: Diagnosis not present

## 2020-03-09 DIAGNOSIS — F10988 Alcohol use, unspecified with other alcohol-induced disorder: Secondary | ICD-10-CM | POA: Diagnosis not present

## 2020-03-09 MED ORDER — THIAMINE HCL 100 MG PO TABS: 100.0000 mg | ORAL_TABLET | Freq: Every day | ORAL | 1 refills | Status: DC

## 2020-03-09 NOTE — Progress Notes (Signed)
[facility]  Patient Name: Susan Francis MRN: [id number] Date of Birth: 1956/04/17 Age: 64 y.o. Education: 12 years  Clinical Impressions  Susan Francis is a 64 y.o., right-hand dominant, widowed female with a history of localization-related epilepsy with complex partial seizures, significant alcohol abuse, hepatitis C, cirhosis, and memory and thinking problems. As per her medical record, she has been concerned about difficulties with memory and thinking since about 2016 but she reported noticing her problems more within the past year on interview. She has some minor memory problems, her friends notice and call her out, and she is worried she will lose her independence. She is quite depressed and was tearful during the interview and parts of the testing session and is aware that is part of her problem. She is still functioning well to the most part although there are some subtle changes in her cooking and recalling afternoon medications. She is not driving related to her seizure condition and not having a car.   Neuropsychological test findings show average overall functioning and overall cognitive ability falling somewhat below that in the unusually low range. She mainly had difficulties on measures of immediate and delayed free recall with relatively better recognition performance. She also did well on several challenging measures of executive function involving attention and memory. Semantic fluency was extremely low although her naming is intact. Performance was within a reasonable expectation at a domain level on measures of attention and processing efficiency and visuospatial/construction, despite a couple of related low scores. She largely did well on executive tasks. She screened in the moderately severe range for depressive symptoms and in the moderate range for anxiety symptoms.   Susan Francis is thus demonstrating a mild level of cognitive impairment.  There are no specifically lateralizing findings to suggest focal cortical dysfunction relating to her epilepsy. Given her imaging and clinical history, I have the feeling this is due in part to her drinking history but also to modifiable risk factors for cognitive decline including depression/anxety and there may be a component of medication side effects. The possibility of a developing underlying condition cannot be entirely ruled out, though there were no compelling indications for such a separate process.   Diagnostic Impressions: Alcohol induced mild neurocognitive disorder, persistent Alcohol use disorder, in full sustained remission  Recommendations to be discussed with patient  Your performance and presentation on assessment today were consistent with a mild level of cognitive impairment. You had difficulties mainly learning and then spontaneously recalling information whereas scores were within gross limits of normal in most other areas at a domain level. I think that the best diagnosis given your severity, at this point in time is mild neurocognitive disorder. This term is synonymous with the more commonly used term "mild cognitive impairment," although without the connotation that there is necessarily a degenerative cause for your illness that will worsen over time.   As for what is causing your mild neurocognitive disorder, I think it is likely your drinking history combined with modifiable risk factors including poor sleep pattern. I do not think you have reached a dementia level of function at this point, which means you should be able to be fully independent, although it is crucial that you take good care of your underlying health to avoid any further decline. I would also suggest that you consider some of the following for your reversible risk factors for cognitive difficulty:   There are few things as disruptive to brain functioning  as not getting a good night's sleep. For sleep, I  recommend against using medications, which can have lingering sedating effects on the brain and rob your brain of restful REM sleep. Instead, consider trying some of the following sleep hygiene recommendations. They may not work at once and may take effort, but the effort you spend is likely to be rewarded with better sleep eventually:  . Stick to a sleep schedule of the same bedtime and wake time even on the weekends, which can help to regulate your body's internal clock so that you fall asleep and stay asleep.  . Practice a relaxing bedtime ritual (conducted away from bright lights) which will help separate your sleep from stimulating activities and prepare your body to fall asleep when you go to bed.  . Avoid naps, especially in the afternoon.  . Evaluate your room and create conditions that will promote sleep such as keeping it cool (between 60 - 67 degrees), quiet, and free from any lights. Consider using blackout curtains, a "white noise" generator, or fan that will help mask any noises that might prevent you from going to sleep or awaken you during the night.  . Sleep on a comfortable mattress and pillows.  . Avoid bright light in the evening and excessive use of portable electronic devices right before bed that may contain light frequencies that can contribute to sleep problems.  . Avoid alcohol, cigarettes, or heavy meals in the evening. If you must eat, consume a light snack 45 minutes before bed.  . Use your bed only for sleep to strengthen the association between your bed and sleep.  . If you can't go to sleep within 30 minutes, go into another room and do something relaxing until you feel tired. Then, come back and try to go to sleep again for 30 minutes and repeat until sleep is achieved.  . Some people find over the counter melatonin to be helpful for sleep, which you could discuss with a pharmacist or prescribing provider.   Some cognitive abilities (such as processing speed) naturally  decline with age, but there are many things you can do to contribute to healthy cognitive aging. There is evidence from at least one study that a modified low carbohydrate mediterranean diet (the MIND-DASH) diet can contribute to healthy cognitive aging. There is also some evidence to suggest a beneficial effect of coffee (black coffee without sugar or other additives such as cream) for healthy aging. One glass of red wine a day may also contribute to healthy aging though at two drinks you lose all benefit and at three drinks it may be doing more harm than good. Staying active, mentally and physically, are also crucially important. One of the best ways to do this is simply to stay active and engaged in your life, particularly with social activities. Challenging the mind and other cognitively stimulating activities are encouraged, consider learning a new hobby, reconnecting with old friends, reading an interesting and thought provoking book. It is not so much what you do that is important as it is that you enjoy it and stay at it.   At this point, I do not have significant concerns about your driving from a neuropsychological perspective although you will need to follow up with Dr. Delice Lesch regarding when and if it is safe for you to resume driving given your seizure condition.   Test Findings  Test scores are summarized in additional documentation associated with this encounter. Test scores are relative to age, gender,  and educational history as available and appropriate. There were no concerns about performance validity as all findings fell within normal expectations.   General Intellectual Functioning/Achievement:  Performance on single word reading fell toward the upper quintile of the average range, which presents as a reasonable standard of comparison for Susan Francis cognitive test data.   Attention and Processing Efficiency: Performance on the Working Memory Index of the WAIS-IV was toward the upper  end of the los average range. She scored at an average level on measures of digit repetition forward, backward, and digit resequencing in ascending order. She generated a very strong score in the superior range on a number-symbol coding procedure additional to the WAIS, suggesting some performance variability.   With respect to processing efficiency, the processing speed index of the WAIS-4 fell at a reasonable low average level. She scored in the unusually low range on one symbol matching to sample test emphasizing efficient visual scanning and efficient visual matching and she scored in the average range on one measure of timed number-symbol coding. Performance on another analogous coding indicator was low average.   Language: Performance on language measures was variable. Her visual object confrontation naming was intact on two different measures. By contrast, she had a hard time with certain fluency indicators. Phonemic fluency was low average whereas semantic fluency was extremely low on two different measures.   Visuospatial Function: Performance was within gross limits of normal on visuospatial and constructional indicators with average figure copy and somewhat weaker but still grossly normal low average range judgment of angular line orientations.   Learning and Memory: Performance on measures of learning and memory was impaired with a profile showing very weak initial encoding of information and poor delayed free recall performance. She did better on delayed recognition, suggesting that there may be an encoding/retrieval element although the possibility of some primary developing storage type problems cannot be entirely ruled out.   In the verbal realm, Susan Francis learned 1, 2, 4, and 5 words of a 10-item list across three repetitions, followed by 3 words at long delayed recall, which is a demonstration of impaired encoding and weak low average delayed free recall. She did better when provided  with recognition cues, with an errorless score when recognizing target words contained amongst distractor alternate items. Memory for a short story was similar with extremely low immediate recall and delayed recall.   In the visual realm, delayed free recall of a modestly complex geometric figure was poor and fell at an unusually low level.   Executive Functions: Performance was generally normal within this domain although the above test findings may suggest an executive-type memory problem given notable encoding and retrieval problems. She solved a normal number of categories (4) on a rule-based categorization procedure emphasizing concept formation and cognitive flexibility. Performance was superior alternating sequencing numbers and letters of the alphabet. Generation of words in response to the letters F-A-S was low average. She performed normally on clock drawing with only very minor errors in number placement. Reasoning with verbal information was low average on the Complex Ideational Material.   Rating Scale(s): Susan Francis screened in the moderately severe range for depressive symptoms and in the moderate range for anxiety symptoms. Qualitatively, she presented as quite distressed and dysphoric on interview.   Viviano Simas Nicole Kindred PsyD, [place] Clinical Neuropsychologist

## 2020-03-09 NOTE — Patient Instructions (Signed)
Your performance and presentation on assessment today were consistent with a mild level of cognitive impairment. You had difficulties mainly learning and then spontaneously recalling information whereas scores were within gross limits of normal in most other areas at a domain level. I think that the best diagnosis given your severity, at this point in time is mild neurocognitive disorder. This term is synonymous with the more commonly used term "mild cognitive impairment," although without the connotation that there is necessarily a degenerative cause for your illness that will worsen over time.   As for what is causing your mild neurocognitive disorder, I think it is likely your drinking history combined with modifiable risk factors including poor sleep pattern. I do not think you have reached a dementia level of function at this point, which means you should be able to be fully independent, although it is crucial that you take good care of your underlying health to avoid any further decline. I would also suggest that you consider some of the following for your reversible risk factors for cognitive difficulty:   There are few things as disruptive to brain functioning as not getting a good night's sleep. For sleep, I recommend against using medications, which can have lingering sedating effects on the brain and rob your brain of restful REM sleep. Instead, consider trying some of the following sleep hygiene recommendations. They may not work at once and may take effort, but the effort you spend is likely to be rewarded with better sleep eventually:   Stick to a sleep schedule of the same bedtime and wake time even on the weekends, which can help to regulate your body's internal clock so that you fall asleep and stay asleep.   Practice a relaxing bedtime ritual (conducted away from bright lights) which will help separate your sleep from stimulating activities and prepare your body to fall asleep when you  go to bed.   Avoid naps, especially in the afternoon.   Evaluate your room and create conditions that will promote sleep such as keeping it cool (between 60 - 67 degrees), quiet, and free from any lights. Consider using blackout curtains, a white noise generator, or fan that will help mask any noises that might prevent you from going to sleep or awaken you during the night.   Sleep on a comfortable mattress and pillows.   Avoid bright light in the evening and excessive use of portable electronic devices right before bed that may contain light frequencies that can contribute to sleep problems.   Avoid alcohol, cigarettes, or heavy meals in the evening. If you must eat, consume a light snack 45 minutes before bed.   Use your bed only for sleep to strengthen the association between your bed and sleep.   If you can't go to sleep within 30 minutes, go into another room and do something relaxing until you feel tired. Then, come back and try to go to sleep again for 30 minutes and repeat until sleep is achieved.   Some people find over the counter melatonin to be helpful for sleep, which you could discuss with a pharmacist or prescribing provider.   Some cognitive abilities (such as processing speed) naturally decline with age, but there are many things you can do to contribute to healthy cognitive aging. There is evidence from at least one study that a modified low carbohydrate mediterranean diet (the MIND-DASH) diet can contribute to healthy cognitive aging. There is also some evidence to suggest a beneficial effect of coffee (  black coffee without sugar or other additives such as cream) for healthy aging. One glass of red wine a day may also contribute to healthy aging though at two drinks you lose all benefit and at three drinks it may be doing more harm than good. Staying active, mentally and physically, are also crucially important. One of the best ways to do this is simply to stay active and  engaged in your life, particularly with social activities. Challenging the mind and other cognitively stimulating activities are encouraged, consider learning a new hobby, reconnecting with old friends, reading an interesting and thought provoking book. It is not so much what you do that is important as it is that you enjoy it and stay at it.   We discussed the fact that you were not entirely forthcoming regarding your drinking history and had a slip up where you drank over the Fourth of July. We discussed the importance of maintaining abstinence from alcohol and involving yourself in treatment services. You have been involved in [facility] in the past and wanted to reestablish, which I highly recommend.   I made referrals for you for psychiatry and psychology, they should be in touch regarding an appointment.   We discussed the importance of eating healthily and taking your thiamine supplements as prescribed by Dr. Delice Lesch, you wanted a new prescription and I will follow up with her nursing pool about that.   At this point, I do not have significant concerns about your driving from a neuropsychological perspective although you will need to follow up with Dr. Delice Lesch regarding when and if it is safe for you to resume driving given your seizure condition.
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
## 2020-03-22 DIAGNOSIS — K7460 Unspecified cirrhosis of liver: Secondary | ICD-10-CM | POA: Diagnosis not present

## 2020-03-22 DIAGNOSIS — F1010 Alcohol abuse, uncomplicated: Secondary | ICD-10-CM | POA: Diagnosis not present

## 2020-03-22 DIAGNOSIS — G40909 Epilepsy, unspecified, not intractable, without status epilepticus: Secondary | ICD-10-CM | POA: Diagnosis not present

## 2020-03-22 DIAGNOSIS — Z79899 Other long term (current) drug therapy: Secondary | ICD-10-CM | POA: Diagnosis not present

## 2020-03-22 DIAGNOSIS — F339 Major depressive disorder, recurrent, unspecified: Secondary | ICD-10-CM | POA: Diagnosis not present

## 2020-03-22 DIAGNOSIS — G4700 Insomnia, unspecified: Secondary | ICD-10-CM | POA: Diagnosis not present

## 2020-04-10 ENCOUNTER — Telehealth: Payer: Self-pay | Admitting: Neurology

## 2020-04-10 DIAGNOSIS — F09 Unspecified mental disorder due to known physiological condition: Secondary | ICD-10-CM

## 2020-04-10 DIAGNOSIS — F418 Other specified anxiety disorders: Secondary | ICD-10-CM

## 2020-04-10 NOTE — Telephone Encounter (Signed)
Patient called in and had a question about her medication

## 2020-04-10 NOTE — Addendum Note (Signed)
Addended by: Ladell Pier A on: 04/10/2020 03:19 PM   Modules accepted: Orders

## 2020-04-10 NOTE — Telephone Encounter (Signed)
Pt asking for a referral to a psychiatrist and a psychologist/therapist. Told her I'd send a referral to Dr Clovis Pu if Dr Delice Lesch agreeable and that their office could refer her to a therapist. She verbalized understanding.

## 2020-04-10 NOTE — Telephone Encounter (Signed)
Agree, thanks

## 2020-04-10 NOTE — Telephone Encounter (Signed)
Referral for psychiatrist (Dr Clovis Pu) faxed to [phone]

## 2020-05-03 DIAGNOSIS — H5213 Myopia, bilateral: Secondary | ICD-10-CM | POA: Diagnosis not present

## 2020-05-03 DIAGNOSIS — H35033 Hypertensive retinopathy, bilateral: Secondary | ICD-10-CM | POA: Diagnosis not present

## 2020-05-03 DIAGNOSIS — H2513 Age-related nuclear cataract, bilateral: Secondary | ICD-10-CM | POA: Diagnosis not present

## 2020-05-07 ENCOUNTER — Other Ambulatory Visit: Payer: Self-pay | Admitting: Neurology

## 2020-05-07 DIAGNOSIS — G40201 Localization-related (focal) (partial) symptomatic epilepsy and epileptic syndromes with complex partial seizures, not intractable, with status epilepticus: Secondary | ICD-10-CM

## 2020-08-28 ENCOUNTER — Encounter: Payer: Self-pay | Admitting: Neurology

## 2020-08-28 ENCOUNTER — Ambulatory Visit: Payer: Medicare HMO | Admitting: Neurology

## 2020-08-28 DIAGNOSIS — Z029 Encounter for administrative examinations, unspecified: Secondary | ICD-10-CM

## 2020-10-09 DIAGNOSIS — F3341 Major depressive disorder, recurrent, in partial remission: Secondary | ICD-10-CM | POA: Diagnosis not present

## 2020-10-09 DIAGNOSIS — K7460 Unspecified cirrhosis of liver: Secondary | ICD-10-CM | POA: Diagnosis not present

## 2020-10-09 DIAGNOSIS — G4700 Insomnia, unspecified: Secondary | ICD-10-CM | POA: Diagnosis not present

## 2020-10-09 DIAGNOSIS — E039 Hypothyroidism, unspecified: Secondary | ICD-10-CM | POA: Diagnosis not present

## 2020-10-23 ENCOUNTER — Other Ambulatory Visit: Payer: Self-pay | Admitting: Neurology

## 2020-10-23 DIAGNOSIS — H5213 Myopia, bilateral: Secondary | ICD-10-CM | POA: Diagnosis not present

## 2021-02-18 ENCOUNTER — Other Ambulatory Visit: Payer: Self-pay | Admitting: Neurology

## 2021-04-12 ENCOUNTER — Other Ambulatory Visit: Payer: Self-pay | Admitting: Neurology

## 2021-04-12 DIAGNOSIS — G40201 Localization-related (focal) (partial) symptomatic epilepsy and epileptic syndromes with complex partial seizures, not intractable, with status epilepticus: Secondary | ICD-10-CM

## 2021-06-05 ENCOUNTER — Other Ambulatory Visit: Payer: Self-pay | Admitting: Neurology

## 2021-10-29 DIAGNOSIS — I959 Hypotension, unspecified: Secondary | ICD-10-CM | POA: Diagnosis not present

## 2021-10-29 DIAGNOSIS — F10929 Alcohol use, unspecified with intoxication, unspecified: Secondary | ICD-10-CM | POA: Diagnosis not present

## 2021-10-29 DIAGNOSIS — E162 Hypoglycemia, unspecified: Secondary | ICD-10-CM | POA: Diagnosis not present

## 2021-10-29 DIAGNOSIS — R4182 Altered mental status, unspecified: Secondary | ICD-10-CM | POA: Diagnosis not present

## 2021-11-10 ENCOUNTER — Other Ambulatory Visit: Payer: Self-pay | Admitting: Neurology

## 2021-11-10 DIAGNOSIS — G40201 Localization-related (focal) (partial) symptomatic epilepsy and epileptic syndromes with complex partial seizures, not intractable, with status epilepticus: Secondary | ICD-10-CM

## 2021-11-14 ENCOUNTER — Encounter: Payer: Self-pay | Admitting: Neurology

## 2021-11-14 ENCOUNTER — Ambulatory Visit: Payer: Medicare HMO | Admitting: Neurology

## 2021-11-14 ENCOUNTER — Other Ambulatory Visit: Payer: Self-pay

## 2021-11-14 DIAGNOSIS — G40201 Localization-related (focal) (partial) symptomatic epilepsy and epileptic syndromes with complex partial seizures, not intractable, with status epilepticus: Secondary | ICD-10-CM | POA: Diagnosis not present

## 2021-11-14 MED ORDER — LEVETIRACETAM 1000 MG PO TABS: ORAL_TABLET | ORAL | 3 refills | Status: DC
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
## 2021-12-08 DIAGNOSIS — R4182 Altered mental status, unspecified: Secondary | ICD-10-CM | POA: Diagnosis not present

## 2021-12-08 DIAGNOSIS — F29 Unspecified psychosis not due to a substance or known physiological condition: Secondary | ICD-10-CM | POA: Diagnosis not present

## 2021-12-08 DIAGNOSIS — R404 Transient alteration of awareness: Secondary | ICD-10-CM | POA: Diagnosis not present

## 2021-12-14 DIAGNOSIS — R402431 Glasgow coma scale score 3-8, in the field [EMT or ambulance]: Secondary | ICD-10-CM | POA: Diagnosis not present

## 2021-12-14 DIAGNOSIS — R4182 Altered mental status, unspecified: Secondary | ICD-10-CM | POA: Diagnosis not present

## 2021-12-14 DIAGNOSIS — R4020 Unspecified coma: Secondary | ICD-10-CM | POA: Diagnosis not present

## 2021-12-14 DIAGNOSIS — R1084 Generalized abdominal pain: Secondary | ICD-10-CM | POA: Diagnosis not present

## 2021-12-14 DIAGNOSIS — R404 Transient alteration of awareness: Secondary | ICD-10-CM | POA: Diagnosis not present

## 2021-12-18 ENCOUNTER — Emergency Department (HOSPITAL_COMMUNITY): Payer: Medicare HMO

## 2021-12-18 ENCOUNTER — Observation Stay (HOSPITAL_COMMUNITY)
Admission: EM | Admit: 2021-12-18 | Discharge: 2021-12-19 | Disposition: A | Discharge status: Home Health | Payer: Medicare HMO | Attending: Surgery | Admitting: Surgery

## 2021-12-18 ENCOUNTER — Other Ambulatory Visit: Payer: Self-pay

## 2021-12-18 ENCOUNTER — Encounter (HOSPITAL_COMMUNITY): Payer: Self-pay | Admitting: Emergency Medicine

## 2021-12-18 DIAGNOSIS — R41841 Cognitive communication deficit: Secondary | ICD-10-CM | POA: Insufficient documentation

## 2021-12-18 DIAGNOSIS — S0240CA Maxillary fracture, right side, initial encounter for closed fracture: Secondary | ICD-10-CM | POA: Diagnosis not present

## 2021-12-18 DIAGNOSIS — R402431 Glasgow coma scale score 3-8, in the field [EMT or ambulance]: Secondary | ICD-10-CM | POA: Diagnosis not present

## 2021-12-18 DIAGNOSIS — F32A Depression, unspecified: Secondary | ICD-10-CM | POA: Insufficient documentation

## 2021-12-18 DIAGNOSIS — K766 Portal hypertension: Secondary | ICD-10-CM | POA: Insufficient documentation

## 2021-12-18 DIAGNOSIS — M47812 Spondylosis without myelopathy or radiculopathy, cervical region: Secondary | ICD-10-CM | POA: Diagnosis not present

## 2021-12-18 DIAGNOSIS — S80211A Abrasion, right knee, initial encounter: Secondary | ICD-10-CM | POA: Insufficient documentation

## 2021-12-18 DIAGNOSIS — S3993XA Unspecified injury of pelvis, initial encounter: Secondary | ICD-10-CM | POA: Diagnosis not present

## 2021-12-18 DIAGNOSIS — F17210 Nicotine dependence, cigarettes, uncomplicated: Secondary | ICD-10-CM | POA: Insufficient documentation

## 2021-12-18 DIAGNOSIS — F1010 Alcohol abuse, uncomplicated: Secondary | ICD-10-CM | POA: Diagnosis not present

## 2021-12-18 DIAGNOSIS — Z8619 Personal history of other infectious and parasitic diseases: Secondary | ICD-10-CM | POA: Insufficient documentation

## 2021-12-18 DIAGNOSIS — S0541XA Penetrating wound of orbit with or without foreign body, right eye, initial encounter: Secondary | ICD-10-CM | POA: Diagnosis not present

## 2021-12-18 DIAGNOSIS — S0689AA Other specified intracranial injury with loss of consciousness status unknown, initial encounter: Secondary | ICD-10-CM | POA: Insufficient documentation

## 2021-12-18 DIAGNOSIS — S2231XA Fracture of one rib, right side, initial encounter for closed fracture: Secondary | ICD-10-CM | POA: Insufficient documentation

## 2021-12-18 DIAGNOSIS — S01112A Laceration without foreign body of left eyelid and periocular area, initial encounter: Secondary | ICD-10-CM | POA: Diagnosis not present

## 2021-12-18 DIAGNOSIS — S80212A Abrasion, left knee, initial encounter: Secondary | ICD-10-CM | POA: Insufficient documentation

## 2021-12-18 DIAGNOSIS — S60511A Abrasion of right hand, initial encounter: Secondary | ICD-10-CM | POA: Insufficient documentation

## 2021-12-18 DIAGNOSIS — S065XAA Traumatic subdural hemorrhage with loss of consciousness status unknown, initial encounter: Secondary | ICD-10-CM | POA: Diagnosis not present

## 2021-12-18 DIAGNOSIS — G319 Degenerative disease of nervous system, unspecified: Secondary | ICD-10-CM | POA: Insufficient documentation

## 2021-12-18 DIAGNOSIS — Y92410 Unspecified street and highway as the place of occurrence of the external cause: Secondary | ICD-10-CM | POA: Diagnosis not present

## 2021-12-18 DIAGNOSIS — G4089 Other seizures: Secondary | ICD-10-CM | POA: Diagnosis not present

## 2021-12-18 DIAGNOSIS — S02841A Fracture of lateral orbital wall, right side, initial encounter for closed fracture: Secondary | ICD-10-CM | POA: Diagnosis not present

## 2021-12-18 DIAGNOSIS — M6281 Muscle weakness (generalized): Secondary | ICD-10-CM | POA: Insufficient documentation

## 2021-12-18 DIAGNOSIS — R58 Hemorrhage, not elsewhere classified: Secondary | ICD-10-CM | POA: Diagnosis not present

## 2021-12-18 DIAGNOSIS — S066XAA Traumatic subarachnoid hemorrhage with loss of consciousness status unknown, initial encounter: Secondary | ICD-10-CM | POA: Insufficient documentation

## 2021-12-18 DIAGNOSIS — S0292XA Unspecified fracture of facial bones, initial encounter for closed fracture: Secondary | ICD-10-CM

## 2021-12-18 DIAGNOSIS — S2241XA Multiple fractures of ribs, right side, initial encounter for closed fracture: Secondary | ICD-10-CM | POA: Diagnosis not present

## 2021-12-18 DIAGNOSIS — R2689 Other abnormalities of gait and mobility: Secondary | ICD-10-CM | POA: Insufficient documentation

## 2021-12-18 DIAGNOSIS — Z981 Arthrodesis status: Secondary | ICD-10-CM | POA: Diagnosis not present

## 2021-12-18 DIAGNOSIS — W1839XA Other fall on same level, initial encounter: Secondary | ICD-10-CM | POA: Insufficient documentation

## 2021-12-18 DIAGNOSIS — S60512A Abrasion of left hand, initial encounter: Secondary | ICD-10-CM | POA: Insufficient documentation

## 2021-12-18 DIAGNOSIS — S3991XA Unspecified injury of abdomen, initial encounter: Secondary | ICD-10-CM | POA: Diagnosis not present

## 2021-12-18 DIAGNOSIS — R739 Hyperglycemia, unspecified: Secondary | ICD-10-CM | POA: Diagnosis not present

## 2021-12-18 DIAGNOSIS — S01111A Laceration without foreign body of right eyelid and periocular area, initial encounter: Secondary | ICD-10-CM | POA: Insufficient documentation

## 2021-12-18 DIAGNOSIS — S199XXA Unspecified injury of neck, initial encounter: Secondary | ICD-10-CM | POA: Diagnosis not present

## 2021-12-18 DIAGNOSIS — I10 Essential (primary) hypertension: Secondary | ICD-10-CM | POA: Insufficient documentation

## 2021-12-18 DIAGNOSIS — E663 Overweight: Secondary | ICD-10-CM | POA: Diagnosis not present

## 2021-12-18 DIAGNOSIS — Z6831 Body mass index (BMI) 31.0-31.9, adult: Secondary | ICD-10-CM | POA: Diagnosis not present

## 2021-12-18 DIAGNOSIS — K7030 Alcoholic cirrhosis of liver without ascites: Secondary | ICD-10-CM | POA: Insufficient documentation

## 2021-12-18 DIAGNOSIS — R4587 Impulsiveness: Secondary | ICD-10-CM | POA: Insufficient documentation

## 2021-12-18 DIAGNOSIS — S0083XA Contusion of other part of head, initial encounter: Secondary | ICD-10-CM

## 2021-12-18 DIAGNOSIS — R2681 Unsteadiness on feet: Secondary | ICD-10-CM | POA: Insufficient documentation

## 2021-12-18 DIAGNOSIS — S0511XA Contusion of eyeball and orbital tissues, right eye, initial encounter: Secondary | ICD-10-CM | POA: Diagnosis not present

## 2021-12-18 DIAGNOSIS — Z560 Unemployment, unspecified: Secondary | ICD-10-CM | POA: Insufficient documentation

## 2021-12-18 DIAGNOSIS — G40909 Epilepsy, unspecified, not intractable, without status epilepticus: Secondary | ICD-10-CM | POA: Insufficient documentation

## 2021-12-18 DIAGNOSIS — Z720 Tobacco use: Secondary | ICD-10-CM | POA: Diagnosis not present

## 2021-12-18 DIAGNOSIS — G939 Disorder of brain, unspecified: Secondary | ICD-10-CM | POA: Diagnosis not present

## 2021-12-18 DIAGNOSIS — D259 Leiomyoma of uterus, unspecified: Secondary | ICD-10-CM | POA: Diagnosis not present

## 2021-12-18 DIAGNOSIS — S066X0A Traumatic subarachnoid hemorrhage without loss of consciousness, initial encounter: Secondary | ICD-10-CM | POA: Diagnosis not present

## 2021-12-18 DIAGNOSIS — E039 Hypothyroidism, unspecified: Secondary | ICD-10-CM | POA: Diagnosis not present

## 2021-12-18 DIAGNOSIS — S299XXA Unspecified injury of thorax, initial encounter: Secondary | ICD-10-CM | POA: Diagnosis not present

## 2021-12-18 DIAGNOSIS — S065X0A Traumatic subdural hemorrhage without loss of consciousness, initial encounter: Secondary | ICD-10-CM | POA: Diagnosis not present

## 2021-12-18 DIAGNOSIS — S2232XA Fracture of one rib, left side, initial encounter for closed fracture: Secondary | ICD-10-CM | POA: Diagnosis not present

## 2021-12-18 DIAGNOSIS — Z9181 History of falling: Secondary | ICD-10-CM | POA: Insufficient documentation

## 2021-12-18 DIAGNOSIS — R402421 Glasgow coma scale score 9-12, in the field [EMT or ambulance]: Secondary | ICD-10-CM | POA: Diagnosis not present

## 2021-12-18 DIAGNOSIS — J988 Other specified respiratory disorders: Secondary | ICD-10-CM | POA: Insufficient documentation

## 2021-12-18 DIAGNOSIS — Z8781 Personal history of (healed) traumatic fracture: Secondary | ICD-10-CM | POA: Insufficient documentation

## 2021-12-18 DIAGNOSIS — R569 Unspecified convulsions: Secondary | ICD-10-CM

## 2021-12-18 DIAGNOSIS — I609 Nontraumatic subarachnoid hemorrhage, unspecified: Principal | ICD-10-CM

## 2021-12-18 DIAGNOSIS — T1490XA Injury, unspecified, initial encounter: Secondary | ICD-10-CM

## 2021-12-18 DIAGNOSIS — R404 Transient alteration of awareness: Secondary | ICD-10-CM | POA: Diagnosis not present

## 2021-12-18 DIAGNOSIS — R519 Headache, unspecified: Secondary | ICD-10-CM | POA: Insufficient documentation

## 2021-12-18 DIAGNOSIS — Z602 Problems related to living alone: Secondary | ICD-10-CM | POA: Insufficient documentation

## 2021-12-18 LAB — URINALYSIS, ROUTINE W REFLEX MICROSCOPIC
Bilirubin Urine: NEGATIVE
Glucose, UA: NEGATIVE mg/dL
Ketones, ur: 5 mg/dL — AB
Leukocytes,Ua: NEGATIVE
Nitrite: NEGATIVE
Protein, ur: 100 mg/dL — AB
Specific Gravity, Urine: 1.044 — ABNORMAL HIGH (ref 1.005–1.030)
pH: 6 (ref 5.0–8.0)

## 2021-12-18 LAB — CBC
HCT: 51.4 % — ABNORMAL HIGH (ref 36.0–46.0)
Hemoglobin: 17.3 g/dL — ABNORMAL HIGH (ref 12.0–15.0)
MCH: 31.3 pg (ref 26.0–34.0)
MCHC: 33.7 g/dL (ref 30.0–36.0)
MCV: 92.9 fL (ref 80.0–100.0)
Platelets: 119 10*3/uL — ABNORMAL LOW (ref 150–400)
RBC: 5.53 MIL/uL — ABNORMAL HIGH (ref 3.87–5.11)
RDW: 14.3 % (ref 11.5–15.5)
WBC: 6.5 10*3/uL (ref 4.0–10.5)
nRBC: 0 % (ref 0.0–0.2)

## 2021-12-18 LAB — COMPREHENSIVE METABOLIC PANEL
ALT: 21 U/L (ref 0–44)
AST: 34 U/L (ref 15–41)
Albumin: 4.3 g/dL (ref 3.5–5.0)
Alkaline Phosphatase: 87 U/L (ref 38–126)
Anion gap: 16 — ABNORMAL HIGH (ref 5–15)
BUN: 9 mg/dL (ref 8–23)
CO2: 20 mmol/L — ABNORMAL LOW (ref 22–32)
Calcium: 8.9 mg/dL (ref 8.9–10.3)
Chloride: 101 mmol/L (ref 98–111)
Creatinine, Ser: 0.95 mg/dL (ref 0.44–1.00)
GFR, Estimated: >60 mL/min (ref >60)
Glucose, Bld: 135 mg/dL — ABNORMAL HIGH (ref 70–99)
Potassium: 4.1 mmol/L (ref 3.5–5.1)
Sodium: 137 mmol/L (ref 135–145)
Total Bilirubin: 1.2 mg/dL (ref 0.3–1.2)
Total Protein: 7.1 g/dL (ref 6.5–8.1)

## 2021-12-18 LAB — ETHANOL: Alcohol, Ethyl (B): <10 mg/dL (ref <10)

## 2021-12-18 LAB — LACTIC ACID, PLASMA: Lactic Acid, Venous: 3.5 mmol/L (ref 0.5–1.9)

## 2021-12-18 LAB — I-STAT CHEM 8, ED
BUN: 11 mg/dL (ref 8–23)
Calcium, Ion: 0.98 mmol/L — ABNORMAL LOW (ref 1.15–1.40)
Chloride: 100 mmol/L (ref 98–111)
Creatinine, Ser: 0.8 mg/dL (ref 0.44–1.00)
Glucose, Bld: 130 mg/dL — ABNORMAL HIGH (ref 70–99)
HCT: 53 % — ABNORMAL HIGH (ref 36.0–46.0)
Hemoglobin: 18 g/dL — ABNORMAL HIGH (ref 12.0–15.0)
Potassium: 4 mmol/L (ref 3.5–5.1)
Sodium: 135 mmol/L (ref 135–145)
TCO2: 24 mmol/L (ref 22–32)

## 2021-12-18 LAB — SAMPLE TO BLOOD BANK

## 2021-12-18 LAB — MRSA NEXT GEN BY PCR, NASAL: MRSA by PCR Next Gen: NOT DETECTED

## 2021-12-18 LAB — HIV ANTIBODY (ROUTINE TESTING W REFLEX): HIV Screen 4th Generation wRfx: NONREACTIVE

## 2021-12-18 LAB — PROTIME-INR
INR: 1.1 (ref 0.8–1.2)
Prothrombin Time: 13.9 seconds (ref 11.4–15.2)

## 2021-12-18 MED ORDER — TRAMADOL HCL 50 MG PO TABS
50.0000 mg | ORAL_TABLET | Freq: Four times a day (QID) | ORAL | Status: DC | PRN
  Administered 2021-12-18 – 2021-12-19 (×2): 50 mg via ORAL
  Filled 2021-12-18 (×2): qty 1

## 2021-12-18 MED ORDER — LORAZEPAM 1 MG PO TABS: 1.0000 mg | ORAL_TABLET | ORAL | Status: DC | PRN

## 2021-12-18 MED ORDER — ONDANSETRON HCL 4 MG/2ML IJ SOLN: 4.0000 mg | Freq: Four times a day (QID) | INTRAMUSCULAR | Status: DC | PRN

## 2021-12-18 MED ORDER — LORAZEPAM 2 MG/ML IJ SOLN
1.0000 mg | Freq: Once | INTRAMUSCULAR | Status: AC
  Administered 2021-12-18: 1 mg via INTRAVENOUS

## 2021-12-18 MED ORDER — PANTOPRAZOLE SODIUM 40 MG PO TBEC
40.0000 mg | DELAYED_RELEASE_TABLET | Freq: Every day | ORAL | Status: DC
  Administered 2021-12-18 – 2021-12-19 (×2): 40 mg via ORAL
  Filled 2021-12-18 (×2): qty 1

## 2021-12-18 MED ORDER — FOLIC ACID 1 MG PO TABS
1.0000 mg | ORAL_TABLET | Freq: Every day | ORAL | Status: DC
  Administered 2021-12-18 – 2021-12-19 (×2): 1 mg via ORAL
  Filled 2021-12-18 (×2): qty 1

## 2021-12-18 MED ORDER — ACETAMINOPHEN 325 MG PO TABS
650.0000 mg | ORAL_TABLET | Freq: Four times a day (QID) | ORAL | Status: DC | PRN
Start: 2021-12-18 — End: 2021-12-19

## 2021-12-18 MED ORDER — BACITRACIN ZINC 500 UNIT/GM EX OINT
TOPICAL_OINTMENT | Freq: Two times a day (BID) | CUTANEOUS | Status: DC
  Administered 2021-12-18: 1 via TOPICAL
  Filled 2021-12-18 (×3): qty 0.9

## 2021-12-18 MED ORDER — ONDANSETRON 4 MG PO TBDP: 4.0000 mg | ORAL_TABLET | Freq: Four times a day (QID) | ORAL | Status: DC | PRN

## 2021-12-18 MED ORDER — METOPROLOL TARTRATE 5 MG/5ML IV SOLN: 5.0000 mg | Freq: Four times a day (QID) | INTRAVENOUS | Status: DC | PRN

## 2021-12-18 MED ORDER — IOHEXOL 350 MG/ML SOLN
100.0000 mL | Freq: Once | INTRAVENOUS | Status: AC | PRN
  Administered 2021-12-18: 100 mL via INTRAVENOUS

## 2021-12-18 MED ORDER — CHLORHEXIDINE GLUCONATE CLOTH 2 % EX PADS
6.0000 | MEDICATED_PAD | Freq: Every day | CUTANEOUS | Status: DC
  Administered 2021-12-18: 6 via TOPICAL

## 2021-12-18 MED ORDER — METOPROLOL TARTRATE 5 MG/5ML IV SOLN
5.0000 mg | Freq: Four times a day (QID) | INTRAVENOUS | Status: DC
  Administered 2021-12-18: 5 mg via INTRAVENOUS
  Filled 2021-12-18: qty 5

## 2021-12-18 MED ORDER — ADULT MULTIVITAMIN W/MINERALS CH
1.0000 | ORAL_TABLET | Freq: Every day | ORAL | Status: DC
  Administered 2021-12-18 – 2021-12-19 (×2): 1 via ORAL
  Filled 2021-12-18 (×2): qty 1

## 2021-12-18 MED ORDER — LEVETIRACETAM IN NACL 1000 MG/100ML IV SOLN
1000.0000 mg | Freq: Two times a day (BID) | INTRAVENOUS | Status: DC
  Administered 2021-12-18 (×2): 1000 mg via INTRAVENOUS
  Filled 2021-12-18 (×3): qty 100

## 2021-12-18 MED ORDER — MORPHINE SULFATE (PF) 4 MG/ML IV SOLN
4.0000 mg | INTRAVENOUS | Status: DC | PRN
  Administered 2021-12-18 (×2): 4 mg via INTRAVENOUS
  Filled 2021-12-18 (×2): qty 1

## 2021-12-18 MED ORDER — THIAMINE HCL 100 MG PO TABS
100.0000 mg | ORAL_TABLET | Freq: Every day | ORAL | Status: DC
  Administered 2021-12-18 – 2021-12-19 (×2): 100 mg via ORAL
  Filled 2021-12-18 (×2): qty 1

## 2021-12-18 MED ORDER — LORAZEPAM 2 MG/ML IJ SOLN: 1.0000 mg | INTRAMUSCULAR | Status: DC | PRN

## 2021-12-18 MED ORDER — ACETAMINOPHEN 500 MG PO TABS
1000.0000 mg | ORAL_TABLET | Freq: Once | ORAL | Status: AC
  Administered 2021-12-18: 1000 mg via ORAL
  Filled 2021-12-18: qty 2

## 2021-12-18 MED ORDER — THIAMINE HCL 100 MG/ML IJ SOLN: 100.0000 mg | Freq: Every day | INTRAMUSCULAR | Status: DC

## 2021-12-18 MED ORDER — PANTOPRAZOLE SODIUM 40 MG IV SOLR: 40.0000 mg | Freq: Every day | INTRAVENOUS | Status: DC

## 2021-12-18 MED ORDER — LORAZEPAM 2 MG/ML IJ SOLN: 0.0000 mg | Freq: Two times a day (BID) | INTRAMUSCULAR | Status: DC

## 2021-12-18 MED ORDER — POTASSIUM CHLORIDE IN NACL 20-0.9 MEQ/L-% IV SOLN
INTRAVENOUS | Status: DC
  Filled 2021-12-18 (×3): qty 1000

## 2021-12-18 MED ORDER — LORAZEPAM 2 MG/ML IJ SOLN: 0.0000 mg | Freq: Four times a day (QID) | INTRAMUSCULAR | Status: DC

## 2021-12-18 MED ORDER — LORAZEPAM 2 MG/ML IJ SOLN
INTRAMUSCULAR | Status: AC
  Filled 2021-12-18: qty 1
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
## 2021-12-18 NOTE — ED Notes (Signed)
Family at bedside. 
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
## 2021-12-18 NOTE — TOC CAGE-AID Note (Signed)
Transition of Care (TOC) - CAGE-AID Screening ? ? ?Patient Details  ?Name: Susan Francis ?MRN: [id number] ?Date of Birth: [birth year] ? ?Clinical Narrative: ? ?Per patient she has history of ETOH use. Currently she believes she drinks approximately 3 bottles of wine per week. Denies alcohol use every day and does not feel she would withdraw. She has been admitted to the hospital before and did not experience these symptoms. She does endorse occasionally marijuana use but denies any other substance abuse. Resources were offered and social work consult placed. ? ?CAGE-AID Screening: ?  ? ?Have You Ever Felt You Ought to Cut Down on Your Drinking or Drug Use?: Yes ?Have People Annoyed You By Critizing Your Drinking Or Drug Use?: Yes ?Have You Felt Bad Or Guilty About Your Drinking Or Drug Use?: No ?Have You Ever Had a Drink or Used Drugs First Thing In The Morning to Steady Your Nerves or to Get Rid of a Hangover?: No ?CAGE-AID Score: 2 ? ?Substance Abuse Education Offered: Yes ? ?  ? ? ? ? ? ? ?
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
## 2021-12-18 NOTE — ED Notes (Signed)
Notified Amanda, TRN of patient's BP systolic 934. No orders received by Wynetta Emery, PA for BP management.  ?
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
## 2021-12-18 NOTE — ED Notes (Signed)
Pt's daughter [name] notified.  ? ?
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
## 2021-12-18 NOTE — ED Triage Notes (Signed)
BIB [facility] after bystanders called to report pt walking then falling to the ground with seizure like activity. Per EMS, pt was a GCS of 3 on their arrival with decorticate posturing and unequal pupils. Pt sustained would to head during fall. Upon hospital arrival, pt responding to commands. MD Ritta Slot at bedside.  ? ?Hx: seizures  ?

## 2021-12-18 NOTE — ED Notes (Signed)
Patient dress in a gown  and on the monitor ?
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
## 2021-12-19 ENCOUNTER — Inpatient Hospital Stay (HOSPITAL_COMMUNITY): Payer: Medicare HMO

## 2021-12-19 DIAGNOSIS — G4089 Other seizures: Secondary | ICD-10-CM | POA: Diagnosis not present

## 2021-12-19 DIAGNOSIS — Z720 Tobacco use: Secondary | ICD-10-CM | POA: Diagnosis not present

## 2021-12-19 DIAGNOSIS — S2241XA Multiple fractures of ribs, right side, initial encounter for closed fracture: Secondary | ICD-10-CM | POA: Diagnosis not present

## 2021-12-19 DIAGNOSIS — S065XAA Traumatic subdural hemorrhage with loss of consciousness status unknown, initial encounter: Secondary | ICD-10-CM | POA: Diagnosis not present

## 2021-12-19 DIAGNOSIS — S01111A Laceration without foreign body of right eyelid and periocular area, initial encounter: Secondary | ICD-10-CM | POA: Diagnosis not present

## 2021-12-19 DIAGNOSIS — S0990XA Unspecified injury of head, initial encounter: Secondary | ICD-10-CM | POA: Diagnosis not present

## 2021-12-19 DIAGNOSIS — I639 Cerebral infarction, unspecified: Secondary | ICD-10-CM | POA: Diagnosis not present

## 2021-12-19 DIAGNOSIS — S066X0A Traumatic subarachnoid hemorrhage without loss of consciousness, initial encounter: Secondary | ICD-10-CM | POA: Diagnosis not present

## 2021-12-19 DIAGNOSIS — S2232XA Fracture of one rib, left side, initial encounter for closed fracture: Secondary | ICD-10-CM | POA: Diagnosis not present

## 2021-12-19 DIAGNOSIS — S01112A Laceration without foreign body of left eyelid and periocular area, initial encounter: Secondary | ICD-10-CM | POA: Diagnosis not present

## 2021-12-19 LAB — CBC
HCT: 43 % (ref 36.0–46.0)
Hemoglobin: 14.9 g/dL (ref 12.0–15.0)
MCH: 31.4 pg (ref 26.0–34.0)
MCHC: 34.7 g/dL (ref 30.0–36.0)
MCV: 90.7 fL (ref 80.0–100.0)
Platelets: 101 10*3/uL — ABNORMAL LOW (ref 150–400)
RBC: 4.74 MIL/uL (ref 3.87–5.11)
RDW: 14.4 % (ref 11.5–15.5)
WBC: 5.8 10*3/uL (ref 4.0–10.5)
nRBC: 0 % (ref 0.0–0.2)

## 2021-12-19 LAB — BASIC METABOLIC PANEL
Anion gap: 9 (ref 5–15)
BUN: 7 mg/dL — ABNORMAL LOW (ref 8–23)
CO2: 24 mmol/L (ref 22–32)
Calcium: 8.4 mg/dL — ABNORMAL LOW (ref 8.9–10.3)
Chloride: 101 mmol/L (ref 98–111)
Creatinine, Ser: 0.85 mg/dL (ref 0.44–1.00)
GFR, Estimated: >60 mL/min (ref >60)
Glucose, Bld: 138 mg/dL — ABNORMAL HIGH (ref 70–99)
Potassium: 3.9 mmol/L (ref 3.5–5.1)
Sodium: 134 mmol/L — ABNORMAL LOW (ref 135–145)

## 2021-12-19 MED ORDER — ACETAMINOPHEN 500 MG PO TABS: 1000.0000 mg | ORAL_TABLET | Freq: Four times a day (QID) | ORAL | 4 refills | Status: AC | PRN

## 2021-12-19 MED ORDER — IBUPROFEN 600 MG PO TABS: 600.0000 mg | ORAL_TABLET | Freq: Four times a day (QID) | ORAL | 1 refills | Status: DC | PRN

## 2021-12-19 MED ORDER — KETOROLAC TROMETHAMINE 15 MG/ML IJ SOLN: 30.0000 mg | Freq: Four times a day (QID) | INTRAMUSCULAR | Status: DC

## 2021-12-19 MED ORDER — SPIRITUS FRUMENTI
1.0000 | Freq: Every day | ORAL | Status: DC
  Filled 2021-12-19: qty 1

## 2021-12-19 MED ORDER — ACETAMINOPHEN 500 MG PO TABS
1000.0000 mg | ORAL_TABLET | Freq: Four times a day (QID) | ORAL | Status: DC
Start: 2021-12-19 — End: 2021-12-19
  Administered 2021-12-19: 1000 mg via ORAL
  Filled 2021-12-19: qty 2

## 2021-12-19 MED ORDER — LEVETIRACETAM 250 MG PO TABS: 1250.0000 mg | ORAL_TABLET | Freq: Two times a day (BID) | ORAL | 1 refills | Status: DC

## 2021-12-19 MED ORDER — TRAMADOL HCL 50 MG PO TABS: 50.0000 mg | ORAL_TABLET | Freq: Four times a day (QID) | ORAL | 0 refills | Status: DC | PRN

## 2021-12-19 MED ORDER — METHOCARBAMOL 750 MG PO TABS: 750.0000 mg | ORAL_TABLET | Freq: Four times a day (QID) | ORAL | 1 refills | Status: DC

## 2021-12-19 MED ORDER — OXYCODONE HCL 5 MG/5ML PO SOLN: 5.0000 mg | ORAL | Status: DC | PRN

## 2021-12-19 MED ORDER — LEVETIRACETAM 750 MG PO TABS
1250.0000 mg | ORAL_TABLET | Freq: Two times a day (BID) | ORAL | Status: DC
  Administered 2021-12-19: 1250 mg via ORAL
  Filled 2021-12-19: qty 1

## 2021-12-19 MED ORDER — METHOCARBAMOL 500 MG PO TABS
1000.0000 mg | ORAL_TABLET | Freq: Three times a day (TID) | ORAL | Status: DC
  Administered 2021-12-19: 1000 mg via ORAL
  Filled 2021-12-19: qty 2

## 2021-12-19 NOTE — Progress Notes (Signed)
Trauma Event Note ?  ?TRN rounded on patient, primary RN at bedside. Pt stable at this time, VS WDL. Pending repeat CT later this morning.  ? ?Last imported Vital Signs ?BP 139/77   Pulse 61   Temp 98.5 ?F (36.9 ?C) (Oral)   Resp 15   Ht '5\' 7"'$  (1.702 m)   Wt 200 lb (90.7 kg)   SpO2 91%   BMI 31.32 kg/m?  ? ?Trending CBC ?Recent Labs  ?  12/18/21 ?0924 12/18/21 ?0934  ?WBC 6.5  --   ?HGB 17.3* 18.0*  ?HCT 51.4* 53.0*  ?PLT 119*  --   ? ? ?Trending Coag's ?Recent Labs  ?  12/18/21 ?0924  ?INR 1.1  ? ? ?Trending BMET ?Recent Labs  ?  12/18/21 ?0924 12/18/21 ?0934  ?NA 137 135  ?K 4.1 4.0  ?CL 101 100  ?CO2 20*  --   ?BUN 9 11  ?CREATININE 0.95 0.80  ?GLUCOSE 135* 130*  ? ? ? ? ?[place]  ?Trauma Response RN ? ?Please call TRN at [phone] for further assistance. ? ? ?  ?
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
## 2021-12-19 NOTE — Care Management CC44 (Signed)
Condition Code 44 Documentation Completed ? ?Patient Details  ?Name: Susan Francis ?MRN: [id number] ?Date of Birth: 1956-05-21 ? ? ?Condition Code 44 given:  Yes ?Patient signature on Condition Code 44 notice:  Yes ?Documentation of 2 MD's agreement:  Yes ?Code 44 added to claim:  Yes ? ? ? ?Ella Bodo, RN ?12/19/2021, 3:29 PM ? ?
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
## 2021-12-19 NOTE — Care Management Obs Status (Signed)
MEDICARE OBSERVATION STATUS NOTIFICATION ? ? ?Patient Details  ?Name: Susan Francis ?MRN: [id number] ?Date of Birth: [birth year] ? ? ?Medicare Observation Status Notification Given:  Yes ? ? ? ?Ella Bodo, RN ?12/19/2021, 3:28 PM ?
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
## 2021-12-19 NOTE — Evaluation (Signed)
Occupational Therapy Evaluation ?Patient Details ?Name: Susan Francis ?MRN: [id number] ?DOB: 03-06-1956 ?Today's Date: 12/19/2021 ? ? ?History of Present Illness Pt is 66 yo female found down with seizure like activity and brought in by EMS for probably seqizure and found to have small SDH. Pt also with R posterolateral maxillary sinus fx and R lateral orbital wall fx.  PMH: seizure disorder, hep C, heavy EtOH use with cirrhosis, ACDF, smoker, HTN, hypothyroid  ? ?Clinical Impression ?  ?Susan Francis was evaluated s/p the above admission list, she is indep at baseline but does not drive. She lives alone in a 1 level apartment with 3 STE. Upon evaluation pt endorsed pain all over and pain/pressure in her face. Overall she was set up- supervision for all sitting tasks and close min G for all OOB tasks. She is limited by impaired cognition demonstrating some confusion, impulsivity, slow processing, difficulty following multi step tasks and sequencing higher level cog tasks. She had 2 mild LOBs at the sink and toilet after impulsivity with turning/moving. Pt will benefit from OT acutely. Recommend d/c to home with near 24/7 direct supervision A for safety, higher level executive functioning tasks and IADLs. If full time supervision is unavailable, pt may need SNF for continued therapies.  ?   ? ?Recommendations for follow up therapy are one component of a multi-disciplinary discharge planning process, led by the attending physician.  Recommendations may be updated based on patient status, additional functional criteria and insurance authorization.  ? ?Follow Up Recommendations ? Home health OT  ?  ?Assistance Recommended at Discharge Frequent or constant Supervision/Assistance  ?Patient can return home with the following A little help with walking and/or transfers;A little help with bathing/dressing/bathroom;Assistance with cooking/housework;Direct supervision/assist for medications management;Direct supervision/assist for  financial management;Assist for transportation;Help with stairs or ramp for entrance ? ?  ?Functional Status Assessment ? Patient has had a recent decline in their functional status and demonstrates the ability to make significant improvements in function in a reasonable and predictable amount of time.  ?Equipment Recommendations ? Tub/shower seat  ?  ?   ?Precautions / Restrictions Precautions ?Precautions: Fall ?Restrictions ?Weight Bearing Restrictions: No  ? ?  ? ?Mobility Bed Mobility ?Overal bed mobility: Needs Assistance ?Bed Mobility: Supine to Sit, Sit to Supine ?  ?  ?Supine to sit: Supervision ?Sit to supine: Supervision ?  ?  ?  ? ?Transfers ?Overall transfer level: Needs assistance ?Equipment used: None ?Transfers: Sit to/from Stand ?Sit to Stand: Min guard ?  ?  ?  ?  ?  ?General transfer comment: from bed and toilet ?  ? ?  ?Balance Overall balance assessment: Needs assistance ?Sitting-balance support: No upper extremity supported, Feet supported ?Sitting balance-Leahy Scale: Good ?  ?  ?Standing balance support: No upper extremity supported, During functional activity ?Standing balance-Leahy Scale: Fair ?Standing balance comment: able to stand at the sink unsupported to groom. min G for safety ?  ?  ?  ?  ?  ?  ?  ?  ?  ?  ?  ?   ? ?ADL either performed or assessed with clinical judgement  ? ?ADL Overall ADL's : Needs assistance/impaired ?Eating/Feeding: Set up;Sitting ?  ?Grooming: Min guard;Standing ?Grooming Details (indicate cue type and reason): oral hygiene at the sink min G fro impulsive turns and mild LOB ?Upper Body Bathing: Set up;Sitting ?  ?Lower Body Bathing: Min guard;Sit to/from stand ?  ?Upper Body Dressing : Set up;Sitting ?  ?Lower Body Dressing: Min  guard;Sit to/from stand ?  ?Toilet Transfer: Min guard;Ambulation ?Toilet Transfer Details (indicate cue type and reason): no AD, min G for impulsivity ?Toileting- Clothing Manipulation and Hygiene: Supervision/safety;Sitting/lateral  lean ?  ?  ?  ?Functional mobility during ADLs: Min guard ?General ADL Comments: assist for impaired cognition, impulsivity and mild LOB wtih turns  ? ? ? ?Vision Baseline Vision/History: 0 No visual deficits ?Ability to See in Adequate Light: 0 Adequate ?Vision Assessment?: No apparent visual deficits ?Additional Comments: R eye has increased edema but vision not occluded  ?   ?   ?   ? ?Pertinent Vitals/Pain Pain Assessment ?Pain Assessment: Faces ?Faces Pain Scale: Hurts even more ?Pain Location: face & "body" ?Pain Descriptors / Indicators: Discomfort, Aching, Pressure ?Pain Intervention(s): Limited activity within patient's tolerance, Monitored during session  ? ? ? ?Hand Dominance Right ?  ?Extremity/Trunk Assessment Upper Extremity Assessment ?Upper Extremity Assessment: Generalized weakness ?  ?Lower Extremity Assessment ?Lower Extremity Assessment: Defer to PT evaluation ?  ?Cervical / Trunk Assessment ?Cervical / Trunk Assessment: Normal ?  ?Communication Communication ?Communication: No difficulties ?  ?Cognition Arousal/Alertness: Awake/alert ?Behavior During Therapy: Impulsive ?Overall Cognitive Status: Impaired/Different from baseline ?Area of Impairment: Memory, Following commands, Safety/judgement, Awareness, Problem solving ?  ?  ?  ?  ?  ?  ?  ?  ?  ?  ?Memory: Decreased short-term memory, Decreased recall of precautions ?Following Commands: Follows one step commands consistently ?Safety/Judgement: Decreased awareness of safety, Decreased awareness of deficits ?Awareness: Emergent ?Problem Solving: Slow processing, Requires verbal cues ?General Comments: pt verbalized that she does feel like she's thinking or acting "normal." She demonstrates slow processing, needs cues for safety and impulsivity and cues for problem solving. Pt reports that her daughter works at [employer] on the 4th floor - however, per chart she is a [place] ?  ?  ?General Comments  VSS onRA, multiple facial bruises and throughout  body ? ?  ?   ?   ? ? ?Home Living Family/patient expects to be discharged to:: Private residence ?Living Arrangements: Alone ?Available Help at Discharge: Family;Available PRN/intermittently ?Type of Home: Apartment ?Home Access: Stairs to enter ?Entrance Stairs-Number of Steps: 3 ?Entrance Stairs-Rails: None ?Home Layout: One level ?  ?  ?  ?  ?  ?  ?  ?Home Equipment: None ?  ?Additional Comments: daughter is HHRN (used to work at [employer]), lives in [place]. Son lives in [place] ? Lives With: Alone ? ?  ?Prior Functioning/Environment Prior Level of Function : Independent/Modified Independent ?  ?  ?  ?  ?  ?  ?Mobility Comments: does not drive ?ADLs Comments: indep, does not drive ?  ? ?  ?  ?OT Problem List: Decreased range of motion;Decreased strength;Decreased activity tolerance;Impaired balance (sitting and/or standing);Decreased cognition;Decreased safety awareness;Pain ?  ?   ?   ?OT Goals(Current goals can be found in the care plan section) Acute Rehab OT Goals ?Patient Stated Goal: to get some rest ?OT Goal Formulation: With patient ?Time For Goal Achievement: 01/02/22 ?Potential to Achieve Goals: Good ?ADL Goals ?Pt Will Perform Grooming: Independently;standing ?Pt Will Transfer to Toilet: Independently;ambulating ?Additional ADL Goal #1: Pt will indep complete 3 step wayfinding task as a precursor to safe d/c to home ?Additional ADL Goal #2: Pt will indep complete IADL medication management task  ? ?AM-PAC OT "6 Clicks" Daily Activity     ?Outcome Measure Help from another person eating meals?: None ?Help from another person taking care of personal  grooming?: A Little ?Help from another person toileting, which includes using toliet, bedpan, or urinal?: A Little ?Help from another person bathing (including washing, rinsing, drying)?: A Little ?Help from another person to put on and taking off regular upper body clothing?: None ?Help from another person to put on and taking off regular lower body  clothing?: A Little ?6 Click Score: 20 ?  ?End of Session Equipment Utilized During Treatment: Gait belt ?Nurse Communication: Mobility status ? ?Activity Tolerance: Patient tolerated treatment well ?Patient left: in bed;w

## 2021-12-19 NOTE — Progress Notes (Signed)
? ?Trauma/Critical Care Follow Up Note ? ?Subjective:  ?  ?Overnight Issues:  ? ?Objective:  ?Vital signs for last 24 hours: ?Temp:  [98.2 ?F (36.8 ?C)-98.6 ?F (37 ?C)] 98.3 ?F (36.8 ?C) (04/19 0759) ?Pulse Rate:  [57-87] 63 (04/19 1000) ?Resp:  [10-25] 16 (04/19 1000) ?BP: (111-173)/(69-120) 121/85 (04/19 1000) ?SpO2:  [88 %-96 %] 91 % (04/19 1000) ? ?Hemodynamic parameters for last 24 hours: ?  ? ?Intake/Output from previous day: ?04/18 0701 - 04/19 0700 ?In: 1600.8 [I.V.:1400.8; IV Piggyback:200] ?Out: -   ?Intake/Output this shift: ?Total I/O ?In: 60 [I.V.:75] ?Out: -  ? ?Vent settings for last 24 hours: ?  ? ?Physical Exam:  ?Gen: comfortable, no distress ?Neuro: non-focal exam ?HEENT: PERRL, facial bruising ?Neck: supple ?CV: RRR ?Pulm: unlabored breathing ?Abd: soft, NT ?GU: clear yellow urine ?Extr: wwp, no edema ? ? ?Results for orders placed or performed during the hospital encounter of 12/18/21 (from the past 24 hour(s))  ?Urinalysis, Routine w reflex microscopic Urine, In & Out Cath     Status: Abnormal  ? Collection Time: 12/18/21 11:28 AM  ?Result Value Ref Range  ? Color, Urine YELLOW YELLOW  ? APPearance CLEAR CLEAR  ? Specific Gravity, Urine 1.044 (H) 1.005 - 1.030  ? pH 6.0 5.0 - 8.0  ? Glucose, UA NEGATIVE NEGATIVE mg/dL  ? Hgb urine dipstick SMALL (A) NEGATIVE  ? Bilirubin Urine NEGATIVE NEGATIVE  ? Ketones, ur 5 (A) NEGATIVE mg/dL  ? Protein, ur 100 (A) NEGATIVE mg/dL  ? Nitrite NEGATIVE NEGATIVE  ? Leukocytes,Ua NEGATIVE NEGATIVE  ? WBC, UA 0-5 0 - 5 WBC/hpf  ? Bacteria, UA RARE (A) NONE SEEN  ?MRSA Next Gen by PCR, Nasal     Status: None  ? Collection Time: 12/18/21 11:33 AM  ? Specimen: Urine, In & Out Cath; Nasal Swab  ?Result Value Ref Range  ? MRSA by PCR Next Gen NOT DETECTED NOT DETECTED  ?CBC     Status: Abnormal  ? Collection Time: 12/19/21  4:10 AM  ?Result Value Ref Range  ? WBC 5.8 4.0 - 10.5 K/uL  ? RBC 4.74 3.87 - 5.11 MIL/uL  ? Hemoglobin 14.9 12.0 - 15.0 g/dL  ? HCT 43.0 36.0 -  46.0 %  ? MCV 90.7 80.0 - 100.0 fL  ? MCH 31.4 26.0 - 34.0 pg  ? MCHC 34.7 30.0 - 36.0 g/dL  ? RDW 14.4 11.5 - 15.5 %  ? Platelets 101 (L) 150 - 400 K/uL  ? nRBC 0.0 0.0 - 0.2 %  ?Basic metabolic panel     Status: Abnormal  ? Collection Time: 12/19/21  4:10 AM  ?Result Value Ref Range  ? Sodium 134 (L) 135 - 145 mmol/L  ? Potassium 3.9 3.5 - 5.1 mmol/L  ? Chloride 101 98 - 111 mmol/L  ? CO2 24 22 - 32 mmol/L  ? Glucose, Bld 138 (H) 70 - 99 mg/dL  ? BUN 7 (L) 8 - 23 mg/dL  ? Creatinine, Ser 0.85 0.44 - 1.00 mg/dL  ? Calcium 8.4 (L) 8.9 - 10.3 mg/dL  ? GFR, Estimated >60 >60 mL/min  ? Anion gap 9 5 - 15  ? ? ?Assessment & Plan: ? ?Present on Admission: ? SDH (subdural hematoma) (HCC) ? ? ? LOS: 1 day  ? ?Additional comments:I reviewed the patient's new clinical lab test results.   and I reviewed the patients new imaging test results.   ? ?GLF ? ?Seizure disorder - on Keppra 1000 mg BID  at home, regular f/u with Dr. Delice Lesch, last seen ~11mago. D/w Dr. ARory Percyand recs for incr to 1250BID and f/u with primary neurologist.  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 D/w daughter this AM, she will come pick patient up after getting off work this afternoon.  ? ? ?AJesusita Oka MD ?Trauma & General Surgery ?Please use [email or web address] to contact on call provider ? ?12/19/2021 ? ?*Care during the described time interval was  provided by me. I have reviewed this patient's available data, including medical history, events of note, physical examination and test results as part of my evaluation. ? ? ? ?

## 2021-12-19 NOTE — Progress Notes (Signed)
?  NEUROSURGERY PROGRESS NOTE  ? ?No issues overnight. Pt has primarily right frontal HA. No new visual changes or N/T/W. ? ?EXAM:  ?BP 117/78   Pulse 66   Temp 98 ?F (36.7 ?C) (Oral)   Resp 18   Ht '5\' 7"'$  (1.702 m)   Wt 90.7 kg   SpO2 93%   BMI 31.32 kg/m?  ? ?Awake, alert, oriented  ?Speech fluent, appropriate  ?CN grossly intact  ?5/5 BUE/BLE  ? ?IMAGING: ?CTH reviewed and no longer demonstrates any ICH. No HCP. ? ?IMPRESSION:  ?66 y.o. female s/p fall with resolution of small left frontal convexity traumatic SAH. Neurologically stable ?Chronic ACDF hardware failure ? ?PLAN: ?- Cont Keppra (baseline home medication) ?- Can f/u in outpatient NS clinic on PRN basis ? ? ?Consuella Lose, MD ?[facility]  ? ?

## 2021-12-19 NOTE — Evaluation (Signed)
Speech Language Pathology Evaluation ?Patient Details ?Name: Susan Francis ?MRN: [id number] ?DOB: 21-Nov-1955 ?Today's Date: 12/19/2021 ?Time: [phone] ?SLP Time Calculation (min) (ACUTE ONLY): 20 min ? ?Problem List:  ?Patient Active Problem List  ? Diagnosis Date Noted  ? SDH (subdural hematoma) ([place]) 12/18/2021  ? Trauma   ? ?Past Medical History: History reviewed. No pertinent past medical history. ?Past Surgical History: History reviewed. No pertinent surgical history. ?HPI:  ?66yo female admitted 12/18/21, found down with seizure activity. Small SDH, right posterolateral maxillary sinus fx and right lateral orbital wall fx. PMH: seizure disorder, HepC, heavy ETOH use with cirrhosis, smoker, HTN, hypothyroid, depression, ACDF  ? ?Assessment / Plan / Recommendation ?Clinical Impression ? Pt reports she lives in an apartment along. She does not drive. Her daughter assists with driving her to appointments and the grocery store. She reports managing finances and medications independently. The Mini Mental State Examination (MMSE) was administered today. Pt scored 23/30, raising concern for mild neuro cognitive deficits. Pt was accurate for current year, but was incorrect for month, date, day, and season. Good immediate recall of 3 unrelated words. Pt was able to spell WORLD backwards without difficulty. Delayed recall of 2/3 words. No difficulty with naming, repetition, following written directions, or writing a sentence. Slight difficulty with figure copying, but clock drawing was accurate. Pt able to follow 2/3 steps of a 3 step command.  ? ?Recommend outpatient or home health speech therapy services at discharge to maximize independence and safety. ?   ?SLP Assessment ? SLP Recommendation/Assessment: All further Speech Language Pathology needs can be addressed in the next venue of care ? ?SLP Visit Diagnosis: Cognitive communication deficit (R41.841)  ?  ?Recommendations for follow up therapy are one component of a  multi-disciplinary discharge planning process, led by the attending physician.  Recommendations may be updated based on patient status, additional functional criteria and insurance authorization. ?   ?Follow Up Recommendations ? Home health SLP  ?  ?Assistance Recommended at Discharge ? Intermittent Supervision/Assistance  ?Functional Status Assessment Patient has had a recent decline in their functional status and demonstrates the ability to make significant improvements in function in a reasonable and predictable amount of time.  ?   ?   ?SLP Evaluation ?Cognition ? Overall Cognitive Status: Impaired/Different from baseline ?Arousal/Alertness: Awake/alert ?Orientation Level: Oriented to person;Oriented to place;Disoriented to time;Oriented to situation (accurate only for year) ?Year: 2023 ?Month: March ?Day of Week: Incorrect  ?  ?   ?Comprehension ? Auditory Comprehension ?Overall Auditory Comprehension: Appears within functional limits for tasks assessed ?Reading Comprehension ?Reading Status: Within funtional limits  ?  ?Expression Expression ?Primary Mode of Expression: Verbal ?Verbal Expression ?Overall Verbal Expression: Appears within functional limits for tasks assessed ?Written Expression ?Dominant Hand: Right ?Written Expression: Within Functional Limits   ?Oral / Motor ? Oral Motor/Sensory Function ?Overall Oral Motor/Sensory Function: Within functional limits ?Motor Speech ?Overall Motor Speech: Appears within functional limits for tasks assessed ?Intelligibility: Intelligible   ?        ? ?Chanita Boden B. Laveyah Oriol, MSP, CCC-SLP ?Speech Language Pathologist ?Office: [phone] ? ?Shonna Chock ?12/19/2021, 9:51 AM ? ?

## 2021-12-20 ENCOUNTER — Encounter: Payer: Self-pay | Admitting: Neurology

## 2021-12-20 ENCOUNTER — Telehealth: Payer: Self-pay | Admitting: Neurology

## 2021-12-20 NOTE — Telephone Encounter (Signed)
Daughter states her mother just got out of the  hospital  yesterday, and wants a follow up appt. ?

## 2021-12-21 NOTE — Telephone Encounter (Signed)
Patient scheduled 12/25/21 at 8:30 AM per MD. Susan Francis with daughter [name] and scheduled patient. ?

## 2021-12-25 ENCOUNTER — Ambulatory Visit: Payer: Medicare HMO | Admitting: Neurology

## 2021-12-25 ENCOUNTER — Encounter: Payer: Self-pay | Admitting: Neurology

## 2021-12-25 VITALS — BP 108/69 | HR 84 | Ht 62.0 in | Wt 189.0 lb

## 2021-12-25 DIAGNOSIS — G40201 Localization-related (focal) (partial) symptomatic epilepsy and epileptic syndromes with complex partial seizures, not intractable, with status epilepticus: Secondary | ICD-10-CM | POA: Diagnosis not present

## 2021-12-25 DIAGNOSIS — F10988 Alcohol use, unspecified with other alcohol-induced disorder: Secondary | ICD-10-CM | POA: Diagnosis not present

## 2021-12-25 DIAGNOSIS — F419 Anxiety disorder, unspecified: Secondary | ICD-10-CM | POA: Diagnosis not present

## 2021-12-25 DIAGNOSIS — F1090 Alcohol use, unspecified, uncomplicated: Secondary | ICD-10-CM

## 2021-12-25 DIAGNOSIS — F32A Depression, unspecified: Secondary | ICD-10-CM | POA: Diagnosis not present

## 2021-12-25 MED ORDER — LEVETIRACETAM 1000 MG PO TABS: 1000.0000 mg | ORAL_TABLET | Freq: Two times a day (BID) | ORAL | 3 refills | Status: DC

## 2021-12-25 NOTE — Patient Instructions (Signed)
I hope you start feeling better soon. ? ?Take Keppra '1000mg'$ : 1 tablet twice a day ? ?2. Referral will be sent to [facility] for anxiety/depression/alcohol use ? ?3. Referral will be sent to [facility] for [facility] nursing and speech therapy (cognitive therapy) ? ?4. Continue with plans to slowly reduce alcohol intake ? ?5. Follow-up in 3 months, call for any changes ? ? ?Seizure Precautions: ?1. If medication has been prescribed for you to prevent seizures, take it exactly as directed.  Do not stop taking the medicine without talking to your doctor first, even if you have not had a seizure in a long time.  ? ?2. Avoid activities in which a seizure would cause danger to yourself or to others.  Don't operate dangerous machinery, swim alone, or climb in high or dangerous places, such as on ladders, roofs, or girders.  Do not drive unless your doctor says you may. ? ?3. If you have any warning that you may have a seizure, lay down in a safe place where you can't hurt yourself.   ? ?4.  No driving for 6 months from last seizure, as per [facility].   Please refer to the following link on the [place] website for more information: http://www.epilepsyfoundation.org/answerplace/Social/driving/drivingu.cfm  ? ?5.  Maintain good sleep hygiene. Start weaning off alcohol use ? ?6.  Contact your doctor if you have any problems that may be related to the medicine you are taking. ? ?7.  Call 911 and bring the patient back to the ED if: ?      ? A.  The seizure lasts longer than 5 minutes.      ? B.  The patient doesn't awaken shortly after the seizure ? C.  The patient has new problems such as difficulty seeing, speaking or moving ? D.  The patient was injured during the seizure ? E.  The patient has a temperature over 102 F (39C) ? F.  The patient vomited and now is having trouble breathing ?      ? ?
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
## 2021-12-26 ENCOUNTER — Telehealth (HOSPITAL_COMMUNITY): Payer: Self-pay | Admitting: Licensed Clinical Social Worker

## 2021-12-26 DIAGNOSIS — G40909 Epilepsy, unspecified, not intractable, without status epilepticus: Secondary | ICD-10-CM | POA: Diagnosis not present

## 2021-12-26 DIAGNOSIS — S065X9S Traumatic subdural hemorrhage with loss of consciousness of unspecified duration, sequela: Secondary | ICD-10-CM | POA: Diagnosis not present

## 2021-12-26 DIAGNOSIS — F10239 Alcohol dependence with withdrawal, unspecified: Secondary | ICD-10-CM | POA: Diagnosis not present

## 2021-12-26 DIAGNOSIS — Z79899 Other long term (current) drug therapy: Secondary | ICD-10-CM | POA: Diagnosis not present

## 2021-12-26 DIAGNOSIS — T1490XA Injury, unspecified, initial encounter: Secondary | ICD-10-CM | POA: Diagnosis not present

## 2021-12-26 DIAGNOSIS — F3341 Major depressive disorder, recurrent, in partial remission: Secondary | ICD-10-CM | POA: Diagnosis not present

## 2021-12-26 DIAGNOSIS — K7460 Unspecified cirrhosis of liver: Secondary | ICD-10-CM | POA: Diagnosis not present

## 2021-12-26 NOTE — Telephone Encounter (Signed)
The therapist attempts to reach the client due to a referral from her Neurologist, Dr. Delice Lesch.  ? ?The therapist leaves a HIPAA-compliant voicemail with his direct contact number which is [phone].  ? ? ?Adam Phenix, MA, LCSW, [facility], LCAS ?12/26/2021 ? ?

## 2022-01-01 ENCOUNTER — Telehealth: Payer: Self-pay

## 2022-01-01 NOTE — Telephone Encounter (Signed)
-----   Message from Cameron Sprang, MD sent at 01/01/2022 12:58 PM EDT ----- ?Can you pls call her daughter and let her know we got a note from [place] that the visit was refused, is everything okay? ? ?Thanks ? ?----- Message ----- ?From: Leeroy Bock ?Sent: 01/01/2022   8:43 AM EDT ?To: Cameron Sprang, MD ? ? ?

## 2022-01-01 NOTE — Telephone Encounter (Signed)
Spoke with pt daughter she stated that she thought she DC orders from the hospital not Our office she is asking if we can send in home health orders for an RN and for speech ,  ?

## 2022-01-03 ENCOUNTER — Other Ambulatory Visit: Payer: Self-pay | Admitting: Gastroenterology

## 2022-01-03 DIAGNOSIS — Z86010 Personal history of colonic polyps: Secondary | ICD-10-CM | POA: Diagnosis not present

## 2022-01-03 DIAGNOSIS — K7030 Alcoholic cirrhosis of liver without ascites: Secondary | ICD-10-CM | POA: Diagnosis not present

## 2022-01-03 DIAGNOSIS — B1920 Unspecified viral hepatitis C without hepatic coma: Secondary | ICD-10-CM | POA: Diagnosis not present

## 2022-01-03 NOTE — Telephone Encounter (Signed)
Can you pls check, I'm pretty sure that is the order we sent last time, for med management (nursing) and speech therapy. Thanks ?

## 2022-01-07 NOTE — Telephone Encounter (Signed)
Pt daughter called no answer left a voice mail that referral was sent to [facility] ?

## 2022-01-09 ENCOUNTER — Telehealth: Payer: Self-pay | Admitting: Neurology

## 2022-01-09 NOTE — Telephone Encounter (Signed)
Patient's daughter Susan Francis called and said [facility] still needs orders for nurse and speech PT at home. ?

## 2022-01-09 NOTE — Telephone Encounter (Signed)
Susan Francis was called back and informed that the referral was sent to [facility]  on May 2th. And that they will contact her for appointment . ?

## 2022-01-18 ENCOUNTER — Telehealth: Payer: Self-pay | Admitting: Neurology

## 2022-01-18 NOTE — Telephone Encounter (Signed)
Debbie a Education officer, museum with [facility] called and left a message. The pt's caregiver declined the social work visit that was ordered for today. She will be visiting her on Monday.

## 2022-01-25 ENCOUNTER — Telehealth: Payer: Self-pay | Admitting: Neurology

## 2022-01-25 MED ORDER — MIRTAZAPINE 15 MG PO TABS: 15.0000 mg | ORAL_TABLET | Freq: Every day | ORAL | 1 refills | Status: AC

## 2022-01-25 MED ORDER — THIAMINE HCL 100 MG PO TABS: 100.0000 mg | ORAL_TABLET | Freq: Every day | ORAL | 1 refills | Status: DC

## 2022-01-25 NOTE — Telephone Encounter (Signed)
Patient called and stated she is out of the thiamine and also the mirtazapine.  She uses the [facility] on [address].

## 2022-01-25 NOTE — Telephone Encounter (Signed)
Medications refilled

## 2022-01-25 NOTE — Telephone Encounter (Signed)
Ok to send refills until f/u with me, thanks

## 2022-01-30 ENCOUNTER — Telehealth: Payer: Self-pay | Admitting: Neurology

## 2022-01-30 NOTE — Telephone Encounter (Signed)
Beth w/ [facility] called to see where the patient was referred to for psychiatric care as stated on her last AVS.

## 2022-01-31 ENCOUNTER — Other Ambulatory Visit: Payer: Self-pay

## 2022-01-31 NOTE — Telephone Encounter (Signed)
Pt was called and given the number to [facility] to set up appointment [phone]

## 2022-02-12 ENCOUNTER — Telehealth: Payer: Self-pay | Admitting: Neurology

## 2022-02-12 ENCOUNTER — Other Ambulatory Visit: Payer: Self-pay

## 2022-02-12 NOTE — Telephone Encounter (Signed)
Patient called and stated needs refill on tramadol.  She uses [facility] on [address].  Also stated would like to make a telehealth appt with a counselor. She states this was discussed at the last visit.

## 2022-02-12 NOTE — Telephone Encounter (Signed)
This is the patient,I sent you message on reply on same questions. Thanks

## 2022-02-21 DIAGNOSIS — H524 Presbyopia: Secondary | ICD-10-CM | POA: Diagnosis not present

## 2022-02-21 DIAGNOSIS — D492 Neoplasm of unspecified behavior of bone, soft tissue, and skin: Secondary | ICD-10-CM | POA: Diagnosis not present

## 2022-03-25 DIAGNOSIS — Z0100 Encounter for examination of eyes and vision without abnormal findings: Secondary | ICD-10-CM | POA: Diagnosis not present

## 2022-04-01 ENCOUNTER — Ambulatory Visit: Payer: Medicare HMO | Admitting: Neurology

## 2022-05-07 ENCOUNTER — Encounter (HOSPITAL_COMMUNITY): Payer: Self-pay | Admitting: Gastroenterology

## 2022-05-13 ENCOUNTER — Encounter (HOSPITAL_COMMUNITY): Payer: Self-pay | Admitting: Anesthesiology
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
## 2022-05-13 NOTE — Anesthesia Preprocedure Evaluation (Deleted)
Anesthesia Evaluation    Reviewed: Allergy & Precautions, Patient's Chart, lab work & pertinent test results  History of Anesthesia Complications Negative for: history of anesthetic complications  Airway        Dental   Pulmonary Current Smoker,           Cardiovascular negative cardio ROS       Neuro/Psych Seizures - (on Keppra),  Anxiety Depression    GI/Hepatic GERD  ,(+) Cirrhosis   Esophageal Varices  substance abuse (last in 12/2021)  alcohol use, Hepatitis -, C  Endo/Other  Hypothyroidism   Renal/GU negative Renal ROS  negative genitourinary   Musculoskeletal negative musculoskeletal ROS (+)   Abdominal   Peds  Hematology negative hematology ROS (+)   Anesthesia Other Findings Day of surgery medications reviewed with patient.  Reproductive/Obstetrics negative OB ROS                             Anesthesia Physical Anesthesia Plan  ASA: 3  Anesthesia Plan: MAC   Post-op Pain Management: Minimal or no pain anticipated   Induction:   PONV Risk Score and Plan: 1 and Treatment may vary due to age or medical condition and Propofol infusion  Airway Management Planned: Natural Airway and Nasal Cannula  Additional Equipment: None  Intra-op Plan:   Post-operative Plan:   Informed Consent:   Plan Discussed with:   Anesthesia Plan Comments:         Anesthesia Quick Evaluation

## 2022-05-14 ENCOUNTER — Ambulatory Visit (HOSPITAL_COMMUNITY): Admission: RE | Admit: 2022-05-14 | Payer: Medicare HMO | Source: Home / Self Care | Admitting: Gastroenterology

## 2022-05-14 SURGERY — COLONOSCOPY WITH PROPOFOL
Anesthesia: Monitor Anesthesia Care

## 2022-05-14 NOTE — Progress Notes (Signed)
Patient did not show up at hospital. [facility] and talked to daughter she stated that patient was going to call and reschedule. I told her to have her call the office to reschedule.

## 2022-07-12 DIAGNOSIS — D485 Neoplasm of uncertain behavior of skin: Secondary | ICD-10-CM | POA: Diagnosis not present

## 2022-07-12 DIAGNOSIS — H02413 Mechanical ptosis of bilateral eyelids: Secondary | ICD-10-CM | POA: Diagnosis not present

## 2022-07-12 DIAGNOSIS — H02831 Dermatochalasis of right upper eyelid: Secondary | ICD-10-CM | POA: Diagnosis not present

## 2022-07-12 DIAGNOSIS — H0279 Other degenerative disorders of eyelid and periocular area: Secondary | ICD-10-CM | POA: Diagnosis not present

## 2022-07-12 DIAGNOSIS — H57813 Brow ptosis, bilateral: Secondary | ICD-10-CM | POA: Diagnosis not present

## 2022-07-12 DIAGNOSIS — H02834 Dermatochalasis of left upper eyelid: Secondary | ICD-10-CM | POA: Diagnosis not present

## 2022-08-16 DIAGNOSIS — C441192 Basal cell carcinoma of skin of left lower eyelid, including canthus: Secondary | ICD-10-CM | POA: Diagnosis not present

## 2022-08-16 DIAGNOSIS — D485 Neoplasm of uncertain behavior of skin: Secondary | ICD-10-CM | POA: Diagnosis not present

## 2022-09-05 DIAGNOSIS — S01412S Laceration without foreign body of left cheek and temporomandibular area, sequela: Secondary | ICD-10-CM | POA: Diagnosis not present

## 2022-09-05 DIAGNOSIS — S01112S Laceration without foreign body of left eyelid and periocular area, sequela: Secondary | ICD-10-CM | POA: Diagnosis not present

## 2022-09-05 DIAGNOSIS — C441192 Basal cell carcinoma of skin of left lower eyelid, including canthus: Secondary | ICD-10-CM | POA: Diagnosis not present

## 2022-09-05 DIAGNOSIS — S0181XS Laceration without foreign body of other part of head, sequela: Secondary | ICD-10-CM | POA: Diagnosis not present

## 2022-09-05 DIAGNOSIS — L821 Other seborrheic keratosis: Secondary | ICD-10-CM | POA: Diagnosis not present

## 2022-11-04 DIAGNOSIS — H02834 Dermatochalasis of left upper eyelid: Secondary | ICD-10-CM | POA: Diagnosis not present

## 2022-11-04 DIAGNOSIS — Z85828 Personal history of other malignant neoplasm of skin: Secondary | ICD-10-CM | POA: Diagnosis not present

## 2022-11-04 DIAGNOSIS — H57813 Brow ptosis, bilateral: Secondary | ICD-10-CM | POA: Diagnosis not present

## 2022-11-04 DIAGNOSIS — H0279 Other degenerative disorders of eyelid and periocular area: Secondary | ICD-10-CM | POA: Diagnosis not present

## 2022-11-04 DIAGNOSIS — Z483 Aftercare following surgery for neoplasm: Secondary | ICD-10-CM | POA: Diagnosis not present

## 2022-11-04 DIAGNOSIS — C441192 Basal cell carcinoma of skin of left lower eyelid, including canthus: Secondary | ICD-10-CM | POA: Diagnosis not present

## 2022-11-04 DIAGNOSIS — Z481 Encounter for planned postprocedural wound closure: Secondary | ICD-10-CM | POA: Diagnosis not present

## 2022-11-04 DIAGNOSIS — H04562 Stenosis of left lacrimal punctum: Secondary | ICD-10-CM | POA: Diagnosis not present

## 2022-11-04 DIAGNOSIS — S01112A Laceration without foreign body of left eyelid and periocular area, initial encounter: Secondary | ICD-10-CM | POA: Diagnosis not present

## 2022-11-04 DIAGNOSIS — S01412S Laceration without foreign body of left cheek and temporomandibular area, sequela: Secondary | ICD-10-CM | POA: Diagnosis not present

## 2022-11-04 DIAGNOSIS — H02831 Dermatochalasis of right upper eyelid: Secondary | ICD-10-CM | POA: Diagnosis not present

## 2022-11-04 DIAGNOSIS — S01412A Laceration without foreign body of left cheek and temporomandibular area, initial encounter: Secondary | ICD-10-CM | POA: Diagnosis not present

## 2022-11-04 DIAGNOSIS — H02413 Mechanical ptosis of bilateral eyelids: Secondary | ICD-10-CM | POA: Diagnosis not present

## 2022-11-19 ENCOUNTER — Encounter: Payer: Self-pay | Admitting: Neurology

## 2022-11-19 ENCOUNTER — Ambulatory Visit (INDEPENDENT_AMBULATORY_CARE_PROVIDER_SITE_OTHER): Payer: Medicare HMO | Admitting: Neurology

## 2022-11-19 VITALS — BP 130/81 | HR 80 | Ht 62.0 in | Wt 186.0 lb

## 2022-11-19 DIAGNOSIS — G40201 Localization-related (focal) (partial) symptomatic epilepsy and epileptic syndromes with complex partial seizures, not intractable, with status epilepticus: Secondary | ICD-10-CM | POA: Diagnosis not present

## 2022-11-19 DIAGNOSIS — R413 Other amnesia: Secondary | ICD-10-CM

## 2022-11-19 MED ORDER — LEVETIRACETAM 1000 MG PO TABS: 1000.0000 mg | ORAL_TABLET | Freq: Two times a day (BID) | ORAL | 3 refills | Status: DC
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
## 2022-11-19 NOTE — Patient Instructions (Signed)
Good to see you doing better.  Continue Keppra (Levetiracetam) 1000mg : take 1 tablet twice a day  2. Schedule Neurocognitive testing  3. Follow-up in 6 months, call for any changes   Seizure Precautions: 1. If medication has been prescribed for you to prevent seizures, take it exactly as directed.  Do not stop taking the medicine without talking to your doctor first, even if you have not had a seizure in a long time.   2. Avoid activities in which a seizure would cause danger to yourself or to others.  Don't operate dangerous machinery, swim alone, or climb in high or dangerous places, such as on ladders, roofs, or girders.  Do not drive unless your doctor says you may.  3. If you have any warning that you may have a seizure, lay down in a safe place where you can't hurt yourself.    4.  No driving for 6 months from last seizure, as per [facility].   Please refer to the following link on the [place] website for more information: http://[email or web address]/[email or web address]/[email or web address]   5.  Maintain good sleep hygiene. Avoid alcohol.  6.  Contact your doctor if you have any problems that may be related to the medicine you are taking.  7.  Call 911 and bring the patient back to the ED if:        A.  The seizure lasts longer than 5 minutes.       B.  The patient doesn't awaken shortly after the seizure  C.  The patient has new problems such as difficulty seeing, speaking or moving  D.  The patient was injured during the seizure  E.  The patient has a temperature over 102 F (39C)  F.  The patient vomited and now is having trouble breathing        FALL PRECAUTIONS: Be cautious when walking. Scan the area for obstacles that may increase the risk of trips and falls. When getting up in the mornings, sit up at the edge of the bed for a few minutes before getting out of bed. Consider elevating the bed at the head end to avoid drop of  blood pressure when getting up. Walk always in a well-lit room (use night lights in the walls). Avoid area rugs or power cords from appliances in the middle of the walkways. Use a walker or a cane if necessary and consider physical therapy for balance exercise. Get your eyesight checked regularly.  FINANCIAL OVERSIGHT: Supervision, especially oversight when making financial decisions or transactions is also recommended as difficulties arise.  HOME SAFETY: Consider the safety of the kitchen when operating appliances like stoves, microwave oven, and blender. Consider having supervision and share cooking responsibilities until no longer able to participate in those. Accidents with firearms and other hazards in the house should be identified and addressed as well.  ABILITY TO BE LEFT ALONE: If patient is unable to contact 911 operator, consider using LifeLine, or when the need is there, arrange for someone to stay with patients. Smoking is a fire hazard, consider supervision or cessation. Risk of wandering should be assessed by caregiver and if detected at any point, supervision and safe proof recommendations should be instituted.  MEDICATION SUPERVISION: Inability to self-administer medication needs to be constantly addressed. Implement a mechanism to ensure safe administration of the medications.  RECOMMENDATIONS FOR ALL PATIENTS WITH MEMORY PROBLEMS: 1. Continue to exercise (Recommend 30 minutes of walking everyday, or 3 hours every week)  2. Increase social interactions - continue going to [place] and enjoy social gatherings with friends and family 3. Eat healthy, avoid fried foods and eat more fruits and vegetables 4. Maintain adequate blood pressure, blood sugar, and blood cholesterol level. Reducing the risk of stroke and cardiovascular disease also helps promoting better memory. 5. Avoid stressful situations. Live a simple life and avoid aggravations. Organize your time and prepare for the next day  in anticipation. 6. Sleep well, avoid any interruptions of sleep and avoid any distractions in the bedroom that may interfere with adequate sleep quality 7. Avoid sugar, avoid sweets as there is a strong link between excessive sugar intake, diabetes, and cognitive impairment We discussed the Mediterranean diet, which has been shown to help patients reduce the risk of progressive memory disorders and reduces cardiovascular risk. This includes eating fish, eat fruits and green leafy vegetables, nuts like almonds and hazelnuts, walnuts, and also use olive oil. Avoid fast foods and fried foods as much as possible. Avoid sweets and sugar as sugar use has been linked to worsening of memory function.

## 2022-12-16 DIAGNOSIS — H5069 Other mechanical strabismus: Secondary | ICD-10-CM | POA: Diagnosis not present

## 2022-12-16 DIAGNOSIS — Z09 Encounter for follow-up examination after completed treatment for conditions other than malignant neoplasm: Secondary | ICD-10-CM | POA: Diagnosis not present

## 2022-12-16 DIAGNOSIS — H02115 Cicatricial ectropion of left lower eyelid: Secondary | ICD-10-CM | POA: Diagnosis not present

## 2022-12-19 ENCOUNTER — Institutional Professional Consult (permissible substitution): Payer: Medicare HMO | Admitting: Psychology

## 2022-12-19 ENCOUNTER — Ambulatory Visit: Payer: Self-pay

## 2022-12-27 ENCOUNTER — Encounter: Payer: Medicare HMO | Admitting: Psychology

## 2023-01-14 DIAGNOSIS — H5069 Other mechanical strabismus: Secondary | ICD-10-CM | POA: Diagnosis not present

## 2023-01-14 DIAGNOSIS — H11242 Scarring of conjunctiva, left eye: Secondary | ICD-10-CM | POA: Diagnosis not present

## 2023-01-14 DIAGNOSIS — L905 Scar conditions and fibrosis of skin: Secondary | ICD-10-CM | POA: Diagnosis not present

## 2023-01-14 DIAGNOSIS — H02115 Cicatricial ectropion of left lower eyelid: Secondary | ICD-10-CM | POA: Diagnosis not present

## 2023-02-21 ENCOUNTER — Ambulatory Visit: Payer: Self-pay

## 2023-02-21 ENCOUNTER — Institutional Professional Consult (permissible substitution): Payer: Medicare HMO | Admitting: Psychology

## 2023-03-05 ENCOUNTER — Encounter: Payer: Medicare HMO | Admitting: Psychology

## 2023-05-12 ENCOUNTER — Ambulatory Visit: Payer: Medicare HMO | Admitting: Neurology

## 2023-06-28 ENCOUNTER — Emergency Department (HOSPITAL_COMMUNITY): Payer: Medicare HMO

## 2023-06-28 ENCOUNTER — Encounter (HOSPITAL_COMMUNITY): Payer: Self-pay | Admitting: Emergency Medicine

## 2023-06-28 ENCOUNTER — Inpatient Hospital Stay (HOSPITAL_COMMUNITY)
Admission: EM | Admit: 2023-06-28 | Discharge: 2023-07-01 | DRG: 100 | Disposition: A | Discharge status: Home Health | Payer: Medicare HMO | Attending: Family Medicine | Admitting: Family Medicine

## 2023-06-28 ENCOUNTER — Other Ambulatory Visit: Payer: Self-pay

## 2023-06-28 DIAGNOSIS — G319 Degenerative disease of nervous system, unspecified: Secondary | ICD-10-CM | POA: Diagnosis not present

## 2023-06-28 DIAGNOSIS — T17908A Unspecified foreign body in respiratory tract, part unspecified causing other injury, initial encounter: Secondary | ICD-10-CM | POA: Diagnosis present

## 2023-06-28 DIAGNOSIS — F319 Bipolar disorder, unspecified: Secondary | ICD-10-CM | POA: Diagnosis present

## 2023-06-28 DIAGNOSIS — J9601 Acute respiratory failure with hypoxia: Secondary | ICD-10-CM | POA: Diagnosis present

## 2023-06-28 DIAGNOSIS — T17800A Unspecified foreign body in other parts of respiratory tract causing asphyxiation, initial encounter: Secondary | ICD-10-CM | POA: Diagnosis not present

## 2023-06-28 DIAGNOSIS — K219 Gastro-esophageal reflux disease without esophagitis: Secondary | ICD-10-CM | POA: Diagnosis present

## 2023-06-28 DIAGNOSIS — Z880 Allergy status to penicillin: Secondary | ICD-10-CM

## 2023-06-28 DIAGNOSIS — Z79899 Other long term (current) drug therapy: Secondary | ICD-10-CM | POA: Diagnosis not present

## 2023-06-28 DIAGNOSIS — Z8711 Personal history of peptic ulcer disease: Secondary | ICD-10-CM

## 2023-06-28 DIAGNOSIS — D72819 Decreased white blood cell count, unspecified: Secondary | ICD-10-CM | POA: Diagnosis present

## 2023-06-28 DIAGNOSIS — F411 Generalized anxiety disorder: Secondary | ICD-10-CM | POA: Diagnosis present

## 2023-06-28 DIAGNOSIS — E039 Hypothyroidism, unspecified: Secondary | ICD-10-CM | POA: Diagnosis present

## 2023-06-28 DIAGNOSIS — Z79890 Hormone replacement therapy (postmenopausal): Secondary | ICD-10-CM

## 2023-06-28 DIAGNOSIS — E669 Obesity, unspecified: Secondary | ICD-10-CM | POA: Diagnosis present

## 2023-06-28 DIAGNOSIS — F1020 Alcohol dependence, uncomplicated: Secondary | ICD-10-CM | POA: Diagnosis present

## 2023-06-28 DIAGNOSIS — Z883 Allergy status to other anti-infective agents status: Secondary | ICD-10-CM

## 2023-06-28 DIAGNOSIS — Z888 Allergy status to other drugs, medicaments and biological substances status: Secondary | ICD-10-CM

## 2023-06-28 DIAGNOSIS — G40201 Localization-related (focal) (partial) symptomatic epilepsy and epileptic syndromes with complex partial seizures, not intractable, with status epilepticus: Secondary | ICD-10-CM | POA: Diagnosis present

## 2023-06-28 DIAGNOSIS — F32A Depression, unspecified: Secondary | ICD-10-CM | POA: Diagnosis not present

## 2023-06-28 DIAGNOSIS — Z8249 Family history of ischemic heart disease and other diseases of the circulatory system: Secondary | ICD-10-CM

## 2023-06-28 DIAGNOSIS — Z8619 Personal history of other infectious and parasitic diseases: Secondary | ICD-10-CM | POA: Diagnosis not present

## 2023-06-28 DIAGNOSIS — K7030 Alcoholic cirrhosis of liver without ascites: Secondary | ICD-10-CM | POA: Diagnosis present

## 2023-06-28 DIAGNOSIS — R0902 Hypoxemia: Secondary | ICD-10-CM | POA: Diagnosis not present

## 2023-06-28 DIAGNOSIS — R918 Other nonspecific abnormal finding of lung field: Secondary | ICD-10-CM | POA: Diagnosis not present

## 2023-06-28 DIAGNOSIS — Z6833 Body mass index (BMI) 33.0-33.9, adult: Secondary | ICD-10-CM

## 2023-06-28 DIAGNOSIS — F17210 Nicotine dependence, cigarettes, uncomplicated: Secondary | ICD-10-CM | POA: Diagnosis present

## 2023-06-28 DIAGNOSIS — Z885 Allergy status to narcotic agent status: Secondary | ICD-10-CM

## 2023-06-28 DIAGNOSIS — R569 Unspecified convulsions: Principal | ICD-10-CM

## 2023-06-28 DIAGNOSIS — E236 Other disorders of pituitary gland: Secondary | ICD-10-CM | POA: Diagnosis present

## 2023-06-28 DIAGNOSIS — G40909 Epilepsy, unspecified, not intractable, without status epilepticus: Secondary | ICD-10-CM | POA: Diagnosis not present

## 2023-06-28 DIAGNOSIS — Z8659 Personal history of other mental and behavioral disorders: Secondary | ICD-10-CM

## 2023-06-28 DIAGNOSIS — Z7141 Alcohol abuse counseling and surveillance of alcoholic: Secondary | ICD-10-CM

## 2023-06-28 DIAGNOSIS — R9431 Abnormal electrocardiogram [ECG] [EKG]: Secondary | ICD-10-CM | POA: Diagnosis present

## 2023-06-28 HISTORY — DX: Alcohol use, unspecified with withdrawal, unspecified: F10.939

## 2023-06-28 LAB — T4, FREE: Free T4: 0.61 ng/dL (ref 0.61–1.12)

## 2023-06-28 LAB — CBC WITH DIFFERENTIAL/PLATELET
Abs Immature Granulocytes: 0.02 10*3/uL (ref 0.00–0.07)
Basophils Absolute: 0 10*3/uL (ref 0.0–0.1)
Basophils Relative: 1 %
Eosinophils Absolute: 0.1 10*3/uL (ref 0.0–0.5)
Eosinophils Relative: 3 %
HCT: 46 % (ref 36.0–46.0)
Hemoglobin: 15.3 g/dL — ABNORMAL HIGH (ref 12.0–15.0)
Immature Granulocytes: 1 %
Lymphocytes Relative: 19 %
Lymphs Abs: 0.6 10*3/uL — ABNORMAL LOW (ref 0.7–4.0)
MCH: 30.3 pg (ref 26.0–34.0)
MCHC: 33.3 g/dL (ref 30.0–36.0)
MCV: 91.1 fL (ref 80.0–100.0)
Monocytes Absolute: 0.2 10*3/uL (ref 0.1–1.0)
Monocytes Relative: 7 %
Neutro Abs: 2.4 10*3/uL (ref 1.7–7.7)
Neutrophils Relative %: 69 %
Platelets: 86 10*3/uL — ABNORMAL LOW (ref 150–400)
RBC: 5.05 MIL/uL (ref 3.87–5.11)
RDW: 15.3 % (ref 11.5–15.5)
WBC: 3.4 10*3/uL — ABNORMAL LOW (ref 4.0–10.5)
nRBC: 0 % (ref 0.0–0.2)

## 2023-06-28 LAB — COMPREHENSIVE METABOLIC PANEL
ALT: 16 U/L (ref 0–44)
AST: 23 U/L (ref 15–41)
Albumin: 3.9 g/dL (ref 3.5–5.0)
Alkaline Phosphatase: 63 U/L (ref 38–126)
Anion gap: 10 (ref 5–15)
BUN: 9 mg/dL (ref 8–23)
CO2: 22 mmol/L (ref 22–32)
Calcium: 8.7 mg/dL — ABNORMAL LOW (ref 8.9–10.3)
Chloride: 105 mmol/L (ref 98–111)
Creatinine, Ser: 0.87 mg/dL (ref 0.44–1.00)
GFR, Estimated: >60 mL/min (ref >60)
Glucose, Bld: 119 mg/dL — ABNORMAL HIGH (ref 70–99)
Potassium: 3.8 mmol/L (ref 3.5–5.1)
Sodium: 137 mmol/L (ref 135–145)
Total Bilirubin: 1.2 mg/dL (ref 0.3–1.2)
Total Protein: 6.7 g/dL (ref 6.5–8.1)

## 2023-06-28 LAB — HIV ANTIBODY (ROUTINE TESTING W REFLEX): HIV Screen 4th Generation wRfx: NONREACTIVE

## 2023-06-28 LAB — CBG MONITORING, ED: Glucose-Capillary: 115 mg/dL — ABNORMAL HIGH (ref 70–99)

## 2023-06-28 LAB — TSH: TSH: 14.159 u[IU]/mL — ABNORMAL HIGH (ref 0.350–4.500)

## 2023-06-28 LAB — ETHANOL: Alcohol, Ethyl (B): <10 mg/dL (ref <10)

## 2023-06-28 MED ORDER — ACETAMINOPHEN 325 MG PO TABS: 650.0000 mg | ORAL_TABLET | Freq: Four times a day (QID) | ORAL | Status: DC | PRN

## 2023-06-28 MED ORDER — LORAZEPAM 2 MG/ML IJ SOLN
2.0000 mg | INTRAMUSCULAR | Status: DC | PRN
  Filled 2023-06-28: qty 1

## 2023-06-28 MED ORDER — THIAMINE MONONITRATE 100 MG PO TABS
100.0000 mg | ORAL_TABLET | Freq: Every day | ORAL | Status: DC
  Administered 2023-06-29 – 2023-07-01 (×3): 100 mg via ORAL
  Filled 2023-06-28 (×3): qty 1

## 2023-06-28 MED ORDER — FOLIC ACID 1 MG PO TABS
1.0000 mg | ORAL_TABLET | Freq: Every day | ORAL | Status: DC
  Administered 2023-06-29 – 2023-07-01 (×3): 1 mg via ORAL
  Filled 2023-06-28 (×3): qty 1

## 2023-06-28 MED ORDER — LEVETIRACETAM IN NACL 1500 MG/100ML IV SOLN
1500.0000 mg | Freq: Once | INTRAVENOUS | Status: AC
  Administered 2023-06-28: 1500 mg via INTRAVENOUS
  Filled 2023-06-28: qty 100

## 2023-06-28 MED ORDER — LEVETIRACETAM 500 MG PO TABS
1000.0000 mg | ORAL_TABLET | Freq: Two times a day (BID) | ORAL | Status: DC
  Administered 2023-06-28: 1000 mg via ORAL
  Filled 2023-06-28: qty 2

## 2023-06-28 MED ORDER — LORAZEPAM 2 MG/ML IJ SOLN
INTRAMUSCULAR | Status: AC
  Administered 2023-06-28: 2 mg
  Filled 2023-06-28: qty 1

## 2023-06-28 MED ORDER — MIRTAZAPINE 15 MG PO TABS
15.0000 mg | ORAL_TABLET | Freq: Every day | ORAL | Status: DC
  Administered 2023-06-28 – 2023-06-30 (×3): 15 mg via ORAL
  Filled 2023-06-28 (×3): qty 1

## 2023-06-28 MED ORDER — POLYETHYLENE GLYCOL 3350 17 G PO PACK: 17.0000 g | PACK | Freq: Every day | ORAL | Status: DC | PRN

## 2023-06-28 MED ORDER — ACETAMINOPHEN 650 MG RE SUPP: 650.0000 mg | Freq: Four times a day (QID) | RECTAL | Status: DC | PRN

## 2023-06-28 MED ORDER — CITALOPRAM HYDROBROMIDE 40 MG PO TABS
40.0000 mg | ORAL_TABLET | Freq: Every day | ORAL | Status: DC
  Administered 2023-06-29 – 2023-07-01 (×3): 40 mg via ORAL
  Filled 2023-06-28 (×4): qty 1

## 2023-06-28 MED ORDER — ENOXAPARIN SODIUM 40 MG/0.4ML IJ SOSY
40.0000 mg | PREFILLED_SYRINGE | INTRAMUSCULAR | Status: DC
  Administered 2023-06-29 – 2023-07-01 (×3): 40 mg via SUBCUTANEOUS
  Filled 2023-06-28 (×3): qty 0.4

## 2023-06-28 MED ORDER — LEVOTHYROXINE SODIUM 75 MCG PO TABS
75.0000 ug | ORAL_TABLET | Freq: Every day | ORAL | Status: DC
  Administered 2023-06-29 – 2023-07-01 (×3): 75 ug via ORAL
  Filled 2023-06-28 (×4): qty 1

## 2023-06-28 MED ORDER — SODIUM CHLORIDE 0.9% FLUSH
3.0000 mL | Freq: Two times a day (BID) | INTRAVENOUS | Status: DC
  Administered 2023-06-28 – 2023-07-01 (×6): 3 mL via INTRAVENOUS

## 2023-06-28 NOTE — ED Notes (Addendum)
Attempted to decrease oxygen to 6L [place], patient continues to desat to 80s. Placed back on NRB @12L . Dr. Lynelle Doctor aware.

## 2023-06-28 NOTE — Progress Notes (Signed)
ATRIUM MONITORING, HU charge captured
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
## 2023-06-28 NOTE — ED Provider Notes (Signed)
[facility] AT [facility] Provider Note   CSN: [id number] Arrival date & time: 06/28/23  0747     History  Chief Complaint  Patient presents with   Seizures    Susan Francis is a 67 y.o. female.   Seizures    Patient has a history of anxiety acid reflux hepatitis C seizure disorder subarachnoid hemorrhage, alcoholic cirrhosis.  Patient presents to the ED for evaluation after a seizure.  Patient had a witnessed seizure this morning lasting a couple of minutes.  The patient does not recall the event.  She denies any symptoms at this point other than feeling somewhat tired she denies any headache.  No fevers or chills.  No vomiting or diarrhea.  No numbness or weakness.  Patient does admit to drinking alcohol but states nothing significant and not recently.  Patient states she has been taking her Keppra magnification regularly  Home Medications Prior to Admission medications   Medication Sig Start Date End Date Taking? Authorizing Provider  buPROPion (WELLBUTRIN XL) 150 MG 24 hr tablet Take 150 mg by mouth daily. 10/05/21   [provider]  citalopram (CELEXA) 40 MG tablet Take 40 mg by mouth daily. 10/05/21   [provider]  folic acid (FOLVITE) 1 MG tablet Take 1 mg by mouth daily. 10/05/21   [provider]  ibuprofen (ADVIL) 200 MG tablet Take 800 mg by mouth every 6 (six) hours as needed for headache.    [provider]  ibuprofen (ADVIL) 600 MG tablet Take 1 tablet (600 mg total) by mouth every 6 (six) hours as needed. 12/19/21   Diamantina Monks, MD  levETIRAcetam (KEPPRA) 1000 MG tablet Take 1 tablet (1,000 mg total) by mouth 2 (two) times daily. 11/19/22   Van Clines, MD  levothyroxine (SYNTHROID) 75 MCG tablet  12/22/19   [provider]  methocarbamol (ROBAXIN-750) 750 MG tablet Take 1 tablet (750 mg total) by mouth 4 (four) times daily. 12/19/21   Diamantina Monks, MD  mirtazapine (REMERON) 15 MG tablet  Take 1 tablet (15 mg total) by mouth at bedtime. 01/25/22   Van Clines, MD  thiamine 100 MG tablet Take 100 mg by mouth daily. 09/07/21   [provider]      Allergies    Codeine, Penicillins, Adhesive [tape], Bacitracin-polymyxin b, Oxycodone hcl, Oxycodone hcl, Protonix [pantoprazole sodium], Wound dressing adhesive, and Oxycodone    Review of Systems   Review of Systems  Neurological:  Positive for seizures.    Physical Exam Updated Vital Signs BP 118/77   Pulse 71   Temp 97.8 F (36.6 C)   Resp 17   Ht 1.575 m (5\' 2" )   Wt 84 kg   SpO2 96%   BMI 33.87 kg/m  Physical Exam Vitals and nursing note reviewed.  Constitutional:      General: She is not in acute distress.    Appearance: She is well-developed.  HENT:     Head: Normocephalic and atraumatic.     Right Ear: External ear normal.     Left Ear: External ear normal.     Mouth/Throat:     Comments: No evidence of bleeding but patient does have a bite injury to the left side of her tongue Eyes:     General: No scleral icterus.       Right eye: No discharge.        Left eye: No discharge.     Conjunctiva/sclera:  Conjunctivae normal.  Neck:     Trachea: No tracheal deviation.  Cardiovascular:     Rate and Rhythm: Normal rate and regular rhythm.  Pulmonary:     Effort: Pulmonary effort is normal. No respiratory distress.     Breath sounds: Normal breath sounds. No stridor. No wheezing or rales.  Abdominal:     General: Bowel sounds are normal. There is no distension.     Palpations: Abdomen is soft.     Tenderness: There is no abdominal tenderness. There is no guarding or rebound.  Musculoskeletal:        General: No tenderness or deformity.     Cervical back: Neck supple.     Comments: Small amount of bruising noted on the left upper arm, no tenderness  Skin:    General: Skin is warm and dry.     Findings: No rash.  Neurological:     General: No focal deficit present.     Mental Status: She is  alert.     Cranial Nerves: No cranial nerve deficit, dysarthria or facial asymmetry.     Sensory: No sensory deficit.     Motor: No abnormal muscle tone or seizure activity.     Coordination: Coordination normal.  Psychiatric:        Mood and Affect: Mood normal.     ED Results / Procedures / Treatments   Labs (all labs ordered are listed, but only abnormal results are displayed) Labs Reviewed  CBC WITH DIFFERENTIAL/PLATELET - Abnormal; Notable for the following components:      Result Value   WBC 3.4 (*)    Hemoglobin 15.3 (*)    Platelets 86 (*)    Lymphs Abs 0.6 (*)    All other components within normal limits  COMPREHENSIVE METABOLIC PANEL - Abnormal; Notable for the following components:   Glucose, Bld 119 (*)    Calcium 8.7 (*)    All other components within normal limits  CBG MONITORING, ED - Abnormal; Notable for the following components:   Glucose-Capillary 115 (*)    All other components within normal limits  ETHANOL  LEVETIRACETAM LEVEL  HIV ANTIBODY (ROUTINE TESTING W REFLEX)    EKG EKG Interpretation Date/Time:  Saturday June 28 2023 08:04:54 EDT Ventricular Rate:  79 PR Interval:  176 QRS Duration:  103 QT Interval:  418 QTC Calculation: 480 R Axis:   1  Text Interpretation: Sinus rhythm Consider left atrial enlargement Abnormal R-wave progression, early transition Confirmed by Linwood Dibbles [id number]) on 06/28/2023 8:29:03 AM  Radiology CT Head Wo Contrast  Result Date: 06/28/2023 CLINICAL DATA:  Seizure disorder EXAM: CT HEAD WITHOUT CONTRAST TECHNIQUE: Contiguous axial images were obtained from the base of the skull through the vertex without intravenous contrast. RADIATION DOSE REDUCTION: This exam was performed according to the departmental dose-optimization program which includes automated exposure control, adjustment of the mA and/or kV according to patient size and/or use of iterative reconstruction technique. 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 Mass effect on the optic chiasm remains equivocal. Otherwise, the brainstem, cerebellum, cerebral peduncles, thalamus, basal ganglia, basilar cisterns, and ventricular system appear within normal limits. No intracranial hemorrhage, additional mass lesion, or acute CVA. Vascular: Unremarkable Skull: Unremarkable Sinuses/Orbits: Mild chronic ethmoid sinusitis anteriorly and mild chronic adjacent frontal sinusitis. Other: No supplemental non-categorized findings. IMPRESSION: 1. No  acute intracranial findings. 2. 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 Mass effect on the optic chiasm remains equivocal. 3. Mild chronic ethmoid and frontal sinusitis. Electronically Signed   By: Gaylyn Rong M.D.   On: 06/28/2023 11:36   DG Chest Portable 1 View  Result Date: 06/28/2023 CLINICAL DATA:  Seizure, hypoxia, possible aspiration. EXAM: PORTABLE CHEST 1 VIEW COMPARISON:  Chest radiograph dated 12/18/2021. FINDINGS: The heart size and mediastinal contours are within normal limits. There is mild lower lung predominant interstitial opacities. No pleural effusion or pneumothorax. The visualized skeletal structures are unremarkable. IMPRESSION: Mild lower lung predominant interstitial opacities may reflect atelectasis or aspiration. Electronically Signed   By: Romona Curls M.D.   On: 06/28/2023 11:07    Procedures .Critical Care  Performed by: Linwood Dibbles, MD Authorized by: Linwood Dibbles, MD   Critical care provider statement:    Critical care time (minutes):  40   Critical care was time spent personally by me on the following activities:  Development of treatment plan with patient or surrogate, discussions with  consultants, evaluation of patient's response to treatment, examination of patient, ordering and review of laboratory studies, ordering and review of radiographic studies, ordering and performing treatments and interventions, pulse oximetry, re-evaluation of patient's condition and review of old charts     Medications Ordered in ED Medications  citalopram (CELEXA) tablet 40 mg (has no administration in time range)  mirtazapine (REMERON) tablet 15 mg (has no administration in time range)  folic acid (FOLVITE) tablet 1 mg (has no administration in time range)  levETIRAcetam (KEPPRA) tablet 1,000 mg (has no administration in time range)  thiamine (VITAMIN B1) tablet 100 mg (has no administration in time range)  LORazepam (ATIVAN) injection 2 mg (has no administration in time range)  enoxaparin (LOVENOX) injection 40 mg (has no administration in time range)  sodium chloride flush (NS) 0.9 % injection 3 mL (has no administration in time range)  acetaminophen (TYLENOL) tablet 650 mg (has no administration in time range)    Or  acetaminophen (TYLENOL) suppository 650 mg (has no administration in time range)  polyethylene glycol (MIRALAX / GLYCOLAX) packet 17 g (has no administration in time range)  LORazepam (ATIVAN) 2 MG/ML injection (2 mg  Given 06/28/23 0915)  levETIRAcetam (KEPPRA) IVPB 1500 mg/ 100 mL premix (0 mg Intravenous Stopped 06/28/23 0949)    ED Course/ Medical Decision Making/ A&P Clinical Course as of 06/28/23 1318  Sat Jun 28, 2023  0919 Patient has had a recurrent seizure.  Tonic-clonic activity unresponsive.  Prior to my arrival patient was assisted with bag-valve-mask ventilation. [JK]  0919 No seizure activity at this time but the patient does have sonorous respirations and not back to baseline.  Still unresponsive.  Will continue to monitor closely.  Do not feel that intubation is necessary at this time [JK]  0945 Notified that patient had a decrease in her O2 sat.  She is  back on a nonrebreather.  Saturation is 100%.  Lungs are clear however patient's mental status still not back to baseline.  No signs of seizure activity at this time.  Neurohospitalist consult placed.  Patient will likely require EEG testing [JK]  [phone] Case discussed with Dr. Amada Jupiter, neurology will see patient [JK]  1250 CXR with possible aspiration, will monitor [JK]  1251 CT without acute findings [JK]  1318 Case discussed with Dr. Alinda Money [JK]    Clinical Course User  Index [JK] Linwood Dibbles, MD                                 Medical Decision Making Amount and/or Complexity of Data Reviewed Labs: ordered. Radiology: ordered.  Risk Prescription drug management. Decision regarding hospitalization.   Patient presented to the ED for evaluation of an acute seizure.  Patient initially was alert and oriented in the ED.  No recurrent seizures.  Patient denied any headache or any recent fevers or chills.  While she was in the ED she had another witnessed seizure.  Patient had a more prolonged return to her baseline.  She was given Ativan and Keppra.  Also possible patient may have aspirated during the seizure.  No significant lab abnormalities noted.  No evidence of intoxication.  No acute findings noted on head CT.  I have consulted with neurology.  Plan will be for EEG and further evaluation.  Patient has returned back to baseline.  She is alert and oriented.  No recurrent seizure activity at this time. Patient however does have a new oxygen requirement.  Chest x-ray suggest the possibility of aspiration.  Will continue to monitor.  Discussed with Dr. Alinda Money regarding admission.       Final Clinical Impression(s) / ED Diagnoses Final diagnoses:  Seizure (HCC)  Aspiration into airway, initial encounter    Rx / DC Orders ED Discharge Orders     None         Linwood Dibbles, MD 06/28/23 1320

## 2023-06-28 NOTE — ED Triage Notes (Signed)
Pt arrives via EMS from neighbors house, where pt was reported to have a seizre lasting a couple of minutes. Pt has hx of seizures, pt reports taking keppra as scheduled. Pt currently alert, oriented x4, denies pain. No injuries noted. VSS.

## 2023-06-28 NOTE — Procedures (Addendum)
Routine EEG Report  Susan Francis is a 67 y.o. female with a history of seizures who is undergoing an EEG to evaluate for seizures.  Report: This EEG was acquired with electrodes placed according to the International 10-20 electrode system (including Fp1, Fp2, F3, F4, C3, C4, P3, P4, O1, O2, T3, T4, T5, T6, A1, A2, Fz, Cz, Pz). The following electrodes were missing or displaced: none.  The occipital dominant rhythm was 7 Hz. This activity is reactive to stimulation. Drowsiness was manifested by background fragmentation; deeper stages of sleep were identified by K complexes and sleep spindles. There was superimposed intermittent focal slowing over the right hemisphere. There were no interictal epileptiform discharges. There was one electrographic seizure recorded originating in the right fronto-temporo-parietal region which lasted approximately one minute. There were no clinical signs on video review of the event. Photic stimulation and hyperventilation were not performed.  Impression and clinical correlation: This EEG was obtained while awake and asleep and is abnormal due to: - mild diffuse slowing indicative of global cerebral dysfunction - superimposed intermittent focal slowing over the right hemisphere - one electrographic seizure without clinical signs originating in the right fronto-temporo-parietal region lasting approximately one minute  Recommend long-term EEG monitoring. Discussed with Dr. Derry Lory.  Bing Neighbors, MD [facility] [phone]  If 7pm- 7am, please page neurology on call as listed in AMION.
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
## 2023-06-28 NOTE — Plan of Care (Signed)
  Problem: Education: Goal: Expressions of having a comfortable level of knowledge regarding the disease process will increase Outcome: Progressing   Problem: Coping: Goal: Ability to adjust to condition or change in health will improve Outcome: Progressing Goal: Ability to identify appropriate support needs will improve Outcome: Progressing   Problem: Health Behavior/Discharge Planning: Goal: Compliance with prescribed medication regimen will improve Outcome: Progressing   Problem: Medication: Goal: Risk for medication side effects will decrease Outcome: Progressing   Problem: Clinical Measurements: Goal: Complications related to the disease process, condition or treatment will be avoided or minimized Outcome: Progressing Goal: Diagnostic test results will improve Outcome: Progressing   Problem: Safety: Goal: Verbalization of understanding the information provided will improve Outcome: Progressing   Problem: Self-Concept: Goal: Level of anxiety will decrease Outcome: Progressing Goal: Ability to verbalize feelings about condition will improve Outcome: Progressing   Problem: Education: Goal: Knowledge of General Education information will improve Description: Including pain rating scale, medication(s)/side effects and non-pharmacologic comfort measures Outcome: Progressing   Problem: Health Behavior/Discharge Planning: Goal: Ability to manage health-related needs will improve Outcome: Progressing   Problem: Clinical Measurements: Goal: Ability to maintain clinical measurements within normal limits will improve Outcome: Progressing Goal: Will remain free from infection Outcome: Progressing Goal: Diagnostic test results will improve Outcome: Progressing Goal: Respiratory complications will improve Outcome: Progressing Goal: Cardiovascular complication will be avoided Outcome: Progressing   Problem: Activity: Goal: Risk for activity intolerance will  decrease Outcome: Progressing   Problem: Nutrition: Goal: Adequate nutrition will be maintained Outcome: Progressing   Problem: Coping: Goal: Level of anxiety will decrease Outcome: Progressing   Problem: Elimination: Goal: Will not experience complications related to bowel motility Outcome: Progressing Goal: Will not experience complications related to urinary retention Outcome: Progressing   Problem: Pain Management: Goal: General experience of comfort will improve Outcome: Progressing   Problem: Safety: Goal: Ability to remain free from injury will improve Outcome: Progressing   Problem: Skin Integrity: Goal: Risk for impaired skin integrity will decrease Outcome: Progressing

## 2023-06-28 NOTE — ED Notes (Signed)
Pt had generalized tonic clonic seizure last approx 2 minutes. Pt was apneic, with drooling and required airway support including BVM during episode. Oxygen sats dropped to 70%. Pts vital signs returned to normal limits after interventions including IV ativan. Pt currently post ictal and disoriented x4. Dr. Lynelle Doctor at bedside aware.

## 2023-06-28 NOTE — ED Notes (Signed)
Pt removed from nonrebreather and dropped oxygen saturations to 65%. Pt verbally stimulated and placed back on NRB with saturations improved. Dr. Lynelle Doctor aware.
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
## 2023-06-28 NOTE — Progress Notes (Signed)
LTM EEG running - no initial skin breakdown - push button tested - neuro notified.  

## 2023-06-28 NOTE — Progress Notes (Signed)
EEG complete - results pending 

## 2023-06-29 ENCOUNTER — Observation Stay (HOSPITAL_COMMUNITY): Payer: Medicare HMO

## 2023-06-29 DIAGNOSIS — R569 Unspecified convulsions: Secondary | ICD-10-CM

## 2023-06-29 DIAGNOSIS — Z880 Allergy status to penicillin: Secondary | ICD-10-CM | POA: Diagnosis not present

## 2023-06-29 DIAGNOSIS — R9431 Abnormal electrocardiogram [ECG] [EKG]: Secondary | ICD-10-CM | POA: Diagnosis present

## 2023-06-29 DIAGNOSIS — Z885 Allergy status to narcotic agent status: Secondary | ICD-10-CM | POA: Diagnosis not present

## 2023-06-29 DIAGNOSIS — K7030 Alcoholic cirrhosis of liver without ascites: Secondary | ICD-10-CM | POA: Diagnosis present

## 2023-06-29 DIAGNOSIS — Z883 Allergy status to other anti-infective agents status: Secondary | ICD-10-CM | POA: Diagnosis not present

## 2023-06-29 DIAGNOSIS — T17908A Unspecified foreign body in respiratory tract, part unspecified causing other injury, initial encounter: Secondary | ICD-10-CM | POA: Diagnosis present

## 2023-06-29 DIAGNOSIS — E669 Obesity, unspecified: Secondary | ICD-10-CM | POA: Diagnosis present

## 2023-06-29 DIAGNOSIS — Z8619 Personal history of other infectious and parasitic diseases: Secondary | ICD-10-CM | POA: Diagnosis not present

## 2023-06-29 DIAGNOSIS — E236 Other disorders of pituitary gland: Secondary | ICD-10-CM | POA: Diagnosis present

## 2023-06-29 DIAGNOSIS — F411 Generalized anxiety disorder: Secondary | ICD-10-CM | POA: Diagnosis present

## 2023-06-29 DIAGNOSIS — E039 Hypothyroidism, unspecified: Secondary | ICD-10-CM | POA: Diagnosis present

## 2023-06-29 DIAGNOSIS — Z79899 Other long term (current) drug therapy: Secondary | ICD-10-CM | POA: Diagnosis not present

## 2023-06-29 DIAGNOSIS — F1020 Alcohol dependence, uncomplicated: Secondary | ICD-10-CM | POA: Diagnosis present

## 2023-06-29 DIAGNOSIS — Z8249 Family history of ischemic heart disease and other diseases of the circulatory system: Secondary | ICD-10-CM | POA: Diagnosis not present

## 2023-06-29 DIAGNOSIS — J9601 Acute respiratory failure with hypoxia: Secondary | ICD-10-CM | POA: Diagnosis present

## 2023-06-29 DIAGNOSIS — K219 Gastro-esophageal reflux disease without esophagitis: Secondary | ICD-10-CM | POA: Diagnosis present

## 2023-06-29 DIAGNOSIS — F319 Bipolar disorder, unspecified: Secondary | ICD-10-CM | POA: Diagnosis present

## 2023-06-29 DIAGNOSIS — F17210 Nicotine dependence, cigarettes, uncomplicated: Secondary | ICD-10-CM | POA: Diagnosis present

## 2023-06-29 DIAGNOSIS — Z8711 Personal history of peptic ulcer disease: Secondary | ICD-10-CM | POA: Diagnosis not present

## 2023-06-29 DIAGNOSIS — Z6833 Body mass index (BMI) 33.0-33.9, adult: Secondary | ICD-10-CM | POA: Diagnosis not present

## 2023-06-29 DIAGNOSIS — Z79890 Hormone replacement therapy (postmenopausal): Secondary | ICD-10-CM | POA: Diagnosis not present

## 2023-06-29 DIAGNOSIS — D72819 Decreased white blood cell count, unspecified: Secondary | ICD-10-CM | POA: Diagnosis present

## 2023-06-29 DIAGNOSIS — G40201 Localization-related (focal) (partial) symptomatic epilepsy and epileptic syndromes with complex partial seizures, not intractable, with status epilepticus: Secondary | ICD-10-CM | POA: Diagnosis present

## 2023-06-29 DIAGNOSIS — Z888 Allergy status to other drugs, medicaments and biological substances status: Secondary | ICD-10-CM | POA: Diagnosis not present

## 2023-06-29 LAB — CBC
HCT: 44.2 % (ref 36.0–46.0)
Hemoglobin: 14.8 g/dL (ref 12.0–15.0)
MCH: 30.6 pg (ref 26.0–34.0)
MCHC: 33.5 g/dL (ref 30.0–36.0)
MCV: 91.3 fL (ref 80.0–100.0)
Platelets: 89 10*3/uL — ABNORMAL LOW (ref 150–400)
RBC: 4.84 MIL/uL (ref 3.87–5.11)
RDW: 15.3 % (ref 11.5–15.5)
WBC: 4.9 10*3/uL (ref 4.0–10.5)
nRBC: 0 % (ref 0.0–0.2)

## 2023-06-29 LAB — COMPREHENSIVE METABOLIC PANEL
ALT: 15 U/L (ref 0–44)
AST: 23 U/L (ref 15–41)
Albumin: 3.9 g/dL (ref 3.5–5.0)
Alkaline Phosphatase: 58 U/L (ref 38–126)
Anion gap: 12 (ref 5–15)
BUN: 9 mg/dL (ref 8–23)
CO2: 20 mmol/L — ABNORMAL LOW (ref 22–32)
Calcium: 8.6 mg/dL — ABNORMAL LOW (ref 8.9–10.3)
Chloride: 104 mmol/L (ref 98–111)
Creatinine, Ser: 0.84 mg/dL (ref 0.44–1.00)
GFR, Estimated: >60 mL/min (ref >60)
Glucose, Bld: 123 mg/dL — ABNORMAL HIGH (ref 70–99)
Potassium: 3.5 mmol/L (ref 3.5–5.1)
Sodium: 136 mmol/L (ref 135–145)
Total Bilirubin: 1.2 mg/dL (ref 0.3–1.2)
Total Protein: 6.5 g/dL (ref 6.5–8.1)

## 2023-06-29 MED ORDER — SODIUM CHLORIDE 0.9 % IV SOLN
200.0000 mg | Freq: Once | INTRAVENOUS | Status: AC
  Administered 2023-06-29: 200 mg via INTRAVENOUS
  Filled 2023-06-29: qty 20

## 2023-06-29 MED ORDER — LACOSAMIDE 50 MG PO TABS
100.0000 mg | ORAL_TABLET | Freq: Two times a day (BID) | ORAL | Status: DC
  Administered 2023-06-29 – 2023-07-01 (×5): 100 mg via ORAL
  Filled 2023-06-29 (×5): qty 2

## 2023-06-29 MED ORDER — LEVETIRACETAM 750 MG PO TABS
1500.0000 mg | ORAL_TABLET | Freq: Two times a day (BID) | ORAL | Status: DC
  Administered 2023-06-29 – 2023-07-01 (×5): 1500 mg via ORAL
  Filled 2023-06-29 (×5): qty 2

## 2023-06-29 NOTE — Progress Notes (Signed)
NEUROLOGY CONSULT FOLLOW UP NOTE   Date of service: June 29, 2023 Patient Name: Susan Francis MRN:  [id number] DOB:  [birth year]  Brief HPI  KATHE CAMPA is a 67 y.o. female  has a past medical history of Abnormal platelets (HCC), Acute hyperactive alcohol withdrawal delirium (HCC) (04/08/2017), Acute, mixed level of activity, alcohol withdrawal delirium (HCC) (04/08/2017), Alcohol withdrawal syndrome with complication (HCC), Anxiety, Cholelithiasis, Colitis, GERD (gastroesophageal reflux disease), Hepatic cirrhosis (HCC), Hepatitis C, Nausea & vomiting (05/01/2014), Nonspecific abnormal electrocardiogram (ECG) (EKG), Seizure (HCC), Status epilepticus (HCC) (07/12/2014), Stomach ulcer, and Thyroid disease. who presented with seizure. On admit to ED, patient confirmed that she takes her Keppra regularly and was able to give complete history. About an hour later, patient had a generalized tonic-clonic seizure lasting approximately 2 minutes, witnessed by RN. She became apneic and required BVM support. She was given IV ativan and loaded with 1500mg  Keppra.. VS returned to normal post interventions, patient requiring supplemental oxygen through NRB. Neurology consulted for seizures.    Interval Hx/subjective   Remains on LTM EEG. Subclinical seizure seen on last night's read of the routine. No seizures seen on overnight read.    Vitals   Vitals:   06/28/23 2012 06/28/23 2030 06/29/23 0417 06/29/23 0748  BP:   113/76 111/76  Pulse: 74   67  Resp: 19  18   Temp:   98.4 F (36.9 C) 99.7 F (37.6 C)  TempSrc:   Oral Oral  SpO2: 92% 95% 93% 90%  Weight:      Height:         Body mass index is 33.87 kg/m.  Physical Exam   Constitutional: Appears well-developed and well-nourished.  Psych: Affect appropriate to situation.  Eyes: No scleral injection.  HENT: No OP obstrucion. Poor dentation.  Head: Normocephalic.  Cardiovascular: Normal rate and regular rhythm.  Respiratory: 2L [place] in use.   GI: Soft.  No distension. There is no tenderness.  Skin: Scratches to LUE.   Neurologic Examination   Mental status/Cognition: Alert, oriented to self, place, month and year, good attention.  Speech/language: Fluent, comprehension intact, object naming intact, repetition intact.  Cranial nerves:   CN II Pupils equal and reactive to light, no VF deficits    CN III,IV,VI EOM intact, no gaze preference or deviation, no nystagmus    CN V Facial sensation is symmetric to touch    CN VII Face is symmetric to movement   CN VIII Hearing is intact to voice   CN IX & X normal palatal elevation, no uvular deviation    CN XI Shoulder shrug is symmetric   CN XII midline tongue with no atrophy or fasciculations.   Motor:  Tone is normal. Bulk is normal. 4+/5 strength was present in all four extremities. Generalized weakness but no drifts present.   Sensation: Intact and symmetrical throughout  Coordination/Complex Motor:  - FNF and HKS are intact bilaterally, not out of line with generalized weakness - Rapid alternating movement slow but intact  Labs and Diagnostic Imaging   CBC:  Recent Labs  Lab 06/28/23 0806 06/29/23 0601  WBC 3.4* 4.9  NEUTROABS 2.4  --   HGB 15.3* 14.8  HCT 46.0 44.2  MCV 91.1 91.3  PLT 86* 89*    Basic Metabolic Panel:  Lab Results  Component Value Date   NA 136 06/29/2023   K 3.5 06/29/2023   CO2 20 (L) 06/29/2023   GLUCOSE 123 (H) 06/29/2023   BUN 9  06/29/2023   CREATININE 0.84 06/29/2023   CALCIUM 8.6 (L) 06/29/2023   GFRNONAA >60 06/29/2023   GFRAA >60 07/25/2019   Lipid Panel: No results found for: "LDLCALC" HgbA1c:  Lab Results  Component Value Date   HGBA1C 5.2 07/17/2014   Urine Drug Screen:     Component Value Date/Time   LABOPIA NONE DETECTED 07/21/2017 2320   COCAINSCRNUR NONE DETECTED 07/21/2017 2320   LABBENZ NONE DETECTED 07/21/2017 2320   AMPHETMU NONE DETECTED 07/21/2017 2320   THCU POSITIVE (A) 07/21/2017 2320   LABBARB  NONE DETECTED 07/21/2017 2320    Alcohol Level     Component Value Date/Time   ETH <10 06/28/2023 0806   INR  Lab Results  Component Value Date   INR 1.1 12/18/2021   APTT  Lab Results  Component Value Date   APTT 35 07/17/2014   AED levels:  Lab Results  Component Value Date   LEVETIRACETA None Detected 06/27/2017    CT Head without contrast(Personally reviewed): No acute intracranial findings.   rEEG read 10/26 pm:  - mild diffuse slowing indicative of global cerebral dysfunction - superimposed intermittent focal slowing over the left  hemisphere - one electrographic seizure without clinical signs originating in the left fronto-temporo-parietal region lasting approximately one minute  LTM 10/26 2346 to 10/27 0915: Suggestive of mild diffuse encephalopathy. No seizures or epileptiform discharges were seen throughout the recording.   Impression   67 y.o. female with PMH significant for Seizures (Keppra 1000mg  BID at home), Bipolar disorder, Anxiety, Hep C, Hepatic Cirrhosis, GERD, Colitis who was BIB EMS after having a seizure.Patient endorsed medication compliance. She is also on Wellbutrin which can lower seixure threshold, now on hold. EEG sowed subclinical seizures and she was continued on LTM.   Recommendations   - seizure precautions - continue LTM EEG - continue Keppra  - continue Vimpat  If no seizures on tomorrow's LTM read, will discontinue this and leave LTM on to assess for seizures as AEDs are reduced.  - continue to hold wellbutrin  SEIZURE PRECAUTIONS Per [facility] statutes, patients with seizures are not allowed to drive until they have been seizure-free for six months.   Use caution when using heavy equipment or power tools. Avoid working on ladders or at heights. Take showers instead of baths. Ensure the water temperature is not too high on the home water heater. Do not go swimming alone. Do not lock yourself in a room alone (i.e. bathroom).  When caring for infants or small children, sit down when holding, feeding, or changing them to minimize risk of injury to the child in the event you have a seizure. Maintain good sleep hygiene. Avoid alcohol.    If patient has another seizure, call 911 and bring them back to the ED if: A.  The seizure lasts longer than 5 minutes.      B.  The patient doesn't wake shortly after the seizure or has new problems such as difficulty seeing, speaking or moving following the seizure C.  The patient was injured during the seizure D.  The patient has a temperature over 102 F (39C) E.  The patient vomited during the seizure and now is having trouble breathing  ______________________________________________________________________  Pt seen by Neuro NP/APP and later by MD. Note/plan to be edited by MD as needed.    Thank you for the opportunity to take part in the care of this patient. If you have any further questions, please contact the neurology consultation  team on call. Updated oncall schedule is listed on AMION.  Signed,  Lynnae January, DNP, AGACNP-BC [facility] Please use AMION for contact information & EPIC for messaging.  I have seen the patient reviewed the above note.  She had seizures last night and was loaded with Vimpat.  She has not had any further seizures on continuous EEG.  I would favor at least another day of continuous EEG recording given that it was without clinical signs.  Neurology will continue to follow.  Ritta Slot, MD [facility] [phone]  If 7pm- 7am, please page neurology on call as listed in AMION.

## 2023-06-29 NOTE — Plan of Care (Signed)
  Problem: Education: Goal: Expressions of having a comfortable level of knowledge regarding the disease process will increase Outcome: Progressing   

## 2023-06-29 NOTE — Progress Notes (Signed)
PROGRESS NOTE    Susan Francis  QIO:[id number] DOB: 02-06-1956 DOA: 06/28/2023 PCP: Lorenda Ishihara, MD   Brief Narrative:  This 67 years old female with PMH significant for seizure disorder, subdural hematoma, GERD, hypothyroidism, alcohol use with history of withdrawal, alcohol cirrhosis, anxiety, depression, QT prolongation presented in the ED after seizure. Patient reportedly had witnessed seizure at neighbor's house that lasted 2 to 3 minutes. Patient does not remember events surrounding the seizures. Patient was initially postictal but subsequently improved en route to the ED. Patient had second seizure that was witnessed by her nurse in the ED.  Subsequently patient became apneic and required oxygen and there is a concern for aspiration.  Patient does have history of alcohol use,  last alcohol drink was 2 days ago.  In the ED chest x-ray showed no acute abnormality. Neurology was consulted recommended Keppra load and EEG monitoring.  Assessment & Plan:   Principal Problem:   Seizure (HCC) Active Problems:   GERD (gastroesophageal reflux disease)   Alcohol use disorder, severe, dependence (HCC)   Prolonged Q-T interval on ECG   Depression   Anxiety state   Acute respiratory failure with hypoxia (HCC)   Localization-related symptomatic epilepsy and epileptic syndromes with complex partial seizures, not intractable, with status epilepticus (HCC)   Alcoholic cirrhosis of liver without ascites (HCC)   History of alcohol withdrawal delirium   Aspiration into lower respiratory tract  Seizure: Patient had witnessed seizure at neighbor's house, followed by repeat seizures in the ED Patient became hypoxic following second seizure in the ED concern for aspiration Patient has prolonged postictal state after second seizure, but now back to baseline. CT head with No acute abnormality. Holding Wellbutrin as this can lower seizure threats to hold, UDS, UA. Appreciate neurology  recommendations and assistance. Continue with Keppra 1000 mg twice daily Seizure precautions Neuro checks Q 6hrs Continue as  needed Ativan for repeat seizure EEG: No evidence of seizures.   Acute hypoxic respiratory failure: Suspected aspiration: Patient has required oxygen up to 12 L now weaned down to 1 L. Patient either has some sleep apnea or significant mouth breathing while asleep and oxygen requirement goes up to 10+ liters by facemask when asleep. Presumed aspiration event during second seizure. Observation and supplemental oxygen only for now Wean oxygen as tolerated Monitor for signs/symptoms development of aspiration pneumonia   Alcohol use: History of alcohol withdrawal Last drink 2 days ago.  Ethanol level negative. Continue home thiamine, folate CIWA without Ativan for now   Alcoholic cirrhosis: LFTs currently stable.  Platelets 86.  PT and INR not assessed. Continue to monitor   Anxiety and Depression: Holding Wellbutrin as above Continue home Celexa and Remeron   Hypothyroidism Check TSH and free T4 Resume levothyroxine.  DVT prophylaxis: Lovenox Code Status: Full code Family Communication: No family at bedside Disposition Plan:    Status is: Observation The patient remains OBS appropriate and will d/c before 2 midnights.  Admitted for seizure episode patient loaded with Keppra EEG is in progress.    Consultants:  Neurology  Procedures: CT Head, EEG  Antimicrobials:  Anti-infectives (From admission, onward)    None      Subjective: Patient was seen and examined at bedside.  Overnight events noted.   Patient reports doing much better. She denies any cough or shortness of breath.   Denies any chest pain.  EEG is complete.  Objective: Vitals:   06/28/23 2012 06/28/23 2030 06/29/23 0417 06/29/23 0748  BP:  113/76 111/76  Pulse: 74   67  Resp: 19  18   Temp:   98.4 F (36.9 C) 99.7 F (37.6 C)  TempSrc:   Oral Oral  SpO2: 92% 95%  93% 90%  Weight:      Height:        Intake/Output Summary (Last 24 hours) at 06/29/2023 1108 Last data filed at 06/29/2023 0307 Gross per 24 hour  Intake 285 ml  Output --  Net 285 ml   Filed Weights   06/28/23 0801  Weight: 84 kg    Examination:  General exam: Appears calm and comfortable, not in any acute distress. Respiratory system: CTA bilaterally.  respiratory effort normal.  RR 16 Cardiovascular system: S1 & S2 heard, RRR. No JVD, murmurs, rubs, gallops or clicks. No pedal edema. Gastrointestinal system: Abdomen is non distended, soft and non tender. Normal bowel sounds heard. Central nervous system: Alert and oriented x 3. No focal neurological deficits. Extremities: No edema, No cyanosis, No clubbing Skin: No rashes, lesions or ulcers Psychiatry: Judgement and insight appear normal. Mood & affect appropriate.     Data Reviewed: I have personally reviewed following labs and imaging studies  CBC: Recent Labs  Lab 06/28/23 0806 06/29/23 0601  WBC 3.4* 4.9  NEUTROABS 2.4  --   HGB 15.3* 14.8  HCT 46.0 44.2  MCV 91.1 91.3  PLT 86* 89*   Basic Metabolic Panel: Recent Labs  Lab 06/28/23 0900 06/29/23 0601  NA 137 136  K 3.8 3.5  CL 105 104  CO2 22 20*  GLUCOSE 119* 123*  BUN 9 9  CREATININE 0.87 0.84  CALCIUM 8.7* 8.6*   GFR: Estimated Creatinine Clearance: 63.6 mL/min (by C-G formula based on SCr of 0.84 mg/dL). Liver Function Tests: Recent Labs  Lab 06/28/23 0900 06/29/23 0601  AST 23 23  ALT 16 15  ALKPHOS 63 58  BILITOT 1.2 1.2  PROT 6.7 6.5  ALBUMIN 3.9 3.9   No results for input(s): "LIPASE", "AMYLASE" in the last 168 hours. No results for input(s): "AMMONIA" in the last 168 hours. Coagulation Profile: No results for input(s): "INR", "PROTIME" in the last 168 hours. Cardiac Enzymes: No results for input(s): "CKTOTAL", "CKMB", "CKMBINDEX", "TROPONINI" in the last 168 hours. BNP (last 3 results) No results for input(s): "PROBNP" in  the last 8760 hours. HbA1C: No results for input(s): "HGBA1C" in the last 72 hours. CBG: Recent Labs  Lab 06/28/23 0831  GLUCAP 115*   Lipid Profile: No results for input(s): "CHOL", "HDL", "LDLCALC", "TRIG", "CHOLHDL", "LDLDIRECT" in the last 72 hours. Thyroid Function Tests: Recent Labs    06/28/23 1523  TSH 14.159*  FREET4 0.61   Anemia Panel: No results for input(s): "VITAMINB12", "FOLATE", "FERRITIN", "TIBC", "IRON", "RETICCTPCT" in the last 72 hours. Sepsis Labs: No results for input(s): "PROCALCITON", "LATICACIDVEN" in the last 168 hours.  No results found for this or any previous visit (from the past 240 hour(s)).  Radiology Studies: Overnight EEG with video  Result Date: 06/29/2023 Charlsie Quest, MD     06/29/2023  9:24 AM Patient Name: CHANAE SYBESMA MRN: [id number] Epilepsy Attending: Charlsie Quest Referring Physician/Provider: Erick Blinks, MD Duration: 06/28/2023 2346 to 06/29/2023 0915 Patient history: 67yo female who was BIB EMS after having a seizure. She was found at her neighbor's house, reported to have seizure lasting a couple of minutes. On admit to ED, patient confirmed that she takes her Keppra regularly and was able to give complete history. About an  hour later, patient had a generalized tonic-clonic seizure lasting approximately 2 minutes, witnessed by RN. EEG to evaluate for seizure Level of alertness: Awake, asleep AEDs during EEG study: Technical aspects: This EEG study was done with scalp electrodes positioned according to the 10-20 International system of electrode placement. Electrical activity was reviewed with band pass filter of 1-70Hz , sensitivity of 7 uV/mm, display speed of 40mm/sec with a 60Hz  notched filter applied as appropriate. EEG data were recorded continuously and digitally stored.  Video monitoring was available and reviewed as appropriate. Description: The posterior dominant rhythm consists of 8 Hz activity of moderate voltage  (25-35 uV) seen predominantly in posterior head regions, symmetric and reactive to eye opening and eye closing. Sleep was characterized by vertex waves, sleep spindles (12 to 14 Hz), maximal frontocentral region. EEG showed intermittent generalized 3 to 6 Hz theta-delta slowing. Hyperventilation and photic stimulation were not performed.   ABNORMALITY - Intermittent slow, generalized IMPRESSION: This study is suggestive of mild diffuse encephalopathy. No seizures or epileptiform discharges were seen throughout the recording. Charlsie Quest   EEG adult  Result Date: 06/28/2023 Jefferson Fuel, MD     06/28/2023  8:38 PM Routine EEG Report MACIEL LANGLOIS is a 67 y.o. female with a history of seizures who is undergoing an EEG to evaluate for seizures. Report: This EEG was acquired with electrodes placed according to the International 10-20 electrode system (including Fp1, Fp2, F3, F4, C3, C4, P3, P4, O1, O2, T3, T4, T5, T6, A1, A2, Fz, Cz, Pz). The following electrodes were missing or displaced: none. The occipital dominant rhythm was 7 Hz. This activity is reactive to stimulation. Drowsiness was manifested by background fragmentation; deeper stages of sleep were identified by K complexes and sleep spindles. There was superimposed intermittent focal slowing over the left hemisphere. There were no interictal epileptiform discharges. There was one electrographic seizure recorded originating in the left fronto-temporo-parietal region which lasted approximately one minute. There were no clinical signs on video review of the event. Photic stimulation and hyperventilation were not performed. Impression and clinical correlation: This EEG was obtained while awake and asleep and is abnormal due to: - mild diffuse slowing indicative of global cerebral dysfunction - superimposed intermittent focal slowing over the left  hemisphere - one electrographic seizure without clinical signs originating in the left  fronto-temporo-parietal region lasting approximately one minute Recommend long-term EEG monitoring. Discussed with Dr. Derry Lory. Bing Neighbors, MD [facility] [phone] If 7pm- 7am, please page neurology on call as listed in AMION.   CT Head Wo Contrast  Result Date: 06/28/2023 CLINICAL DATA:  Seizure disorder EXAM: CT HEAD WITHOUT CONTRAST TECHNIQUE: Contiguous axial images were obtained from the base of the skull through the vertex without intravenous contrast. RADIATION DOSE REDUCTION: This exam was performed according to the departmental dose-optimization program which includes automated exposure control, adjustment of the mA and/or kV according to patient size and/or use of iterative reconstruction technique. 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 Mass effect on the optic chiasm remains equivocal. Otherwise, the brainstem, cerebellum, cerebral peduncles, thalamus, basal ganglia, basilar cisterns, and ventricular system appear within normal limits. No intracranial hemorrhage, additional mass lesion, or acute CVA. Vascular: Unremarkable Skull: Unremarkable Sinuses/Orbits: Mild chronic ethmoid sinusitis anteriorly and mild chronic adjacent frontal sinusitis. Other: No supplemental non-categorized findings. IMPRESSION: 1. No acute  intracranial findings. 2. 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 Mass effect on the optic chiasm remains equivocal. 3. Mild chronic ethmoid and frontal sinusitis. Electronically Signed   By: Gaylyn Rong M.D.   On: 06/28/2023 11:36   DG Chest Portable 1 View  Result Date: 06/28/2023 CLINICAL DATA:  Seizure, hypoxia, possible aspiration. EXAM: PORTABLE CHEST 1  VIEW COMPARISON:  Chest radiograph dated 12/18/2021. FINDINGS: The heart size and mediastinal contours are within normal limits. There is mild lower lung predominant interstitial opacities. No pleural effusion or pneumothorax. The visualized skeletal structures are unremarkable. IMPRESSION: Mild lower lung predominant interstitial opacities may reflect atelectasis or aspiration. Electronically Signed   By: Romona Curls M.D.   On: 06/28/2023 11:07     Scheduled Meds:  citalopram  40 mg Oral Daily   enoxaparin (LOVENOX) injection  40 mg Subcutaneous Q24H   folic acid  1 mg Oral Daily   lacosamide  100 mg Oral BID   levETIRAcetam  1,500 mg Oral BID   levothyroxine  75 mcg Oral Q0600   mirtazapine  15 mg Oral QHS   sodium chloride flush  3 mL Intravenous Q12H   thiamine  100 mg Oral Daily   Continuous Infusions:   LOS: 0 days    Time spent: 50 mins    Willeen Niece, MD [facility]   If 7PM-7AM, please contact night-coverage

## 2023-06-29 NOTE — Care Management Obs Status (Signed)
MEDICARE OBSERVATION STATUS NOTIFICATION   Patient Details  Name: Susan Francis MRN: [id number] Date of Birth: 12-29-1955   Medicare Observation Status Notification Given:  Yes    Lawerance Sabal, RN 06/29/2023, 7:35 AM

## 2023-06-29 NOTE — Progress Notes (Signed)
LTM maint complete - no skin breakdown under:  P3, A1, Fp2

## 2023-06-29 NOTE — Plan of Care (Signed)
  Problem: Education: Goal: Expressions of having a comfortable level of knowledge regarding the disease process will increase Outcome: Progressing   Problem: Medication: Goal: Risk for medication side effects will decrease Outcome: Progressing   Problem: Clinical Measurements: Goal: Complications related to the disease process, condition or treatment will be avoided or minimized Outcome: Progressing Goal: Diagnostic test results will improve Outcome: Progressing   Problem: Health Behavior/Discharge Planning: Goal: Ability to manage health-related needs will improve Outcome: Progressing   Problem: Clinical Measurements: Goal: Ability to maintain clinical measurements within normal limits will improve Outcome: Progressing

## 2023-06-29 NOTE — Procedures (Signed)
Patient Name: Susan Francis  MRN: [id number]  Epilepsy Attending: Charlsie Quest  Referring Physician/Provider: Erick Blinks, MD  Duration: 06/28/2023 2346 to 06/29/2023 2346  Patient history: 67yo female who was BIB EMS after having a seizure. She was found at her neighbor's house, reported to have seizure lasting a couple of minutes. On admit to ED, patient confirmed that she takes her Keppra regularly and was able to give complete history. About an hour later, patient had a generalized tonic-clonic seizure lasting approximately 2 minutes, witnessed by RN. EEG to evaluate for seizure  Level of alertness: Awake, asleep  AEDs during EEG study: LEV, LCM  Technical aspects: This EEG study was done with scalp electrodes positioned according to the 10-20 International system of electrode placement. Electrical activity was reviewed with band pass filter of 1-70Hz , sensitivity of 7 uV/mm, display speed of 21mm/sec with a 60Hz  notched filter applied as appropriate. EEG data were recorded continuously and digitally stored.  Video monitoring was available and reviewed as appropriate.  Description: The posterior dominant rhythm consists of 8 Hz activity of moderate voltage (25-35 uV) seen predominantly in posterior head regions, symmetric and reactive to eye opening and eye closing. Sleep was characterized by vertex waves, sleep spindles (12 to 14 Hz), maximal frontocentral region. EEG showed intermittent generalized and maximal right temporo-parietal 3 to 6 Hz theta-delta slowing. Hyperventilation and photic stimulation were not performed.     ABNORMALITY - Intermittent slow, generalized and maximal right temporo-parietal  IMPRESSION: This study is suggestive of function arising from right temporoparietal region likely secondary nonobstructive abnormality.  Additionally there is mild diffuse encephalopathy. No seizures or epileptiform discharges were seen throughout the recording.  Haydn Cush Annabelle Harman

## 2023-06-30 ENCOUNTER — Inpatient Hospital Stay (HOSPITAL_COMMUNITY): Payer: Medicare HMO

## 2023-06-30 DIAGNOSIS — R569 Unspecified convulsions: Secondary | ICD-10-CM | POA: Diagnosis not present

## 2023-06-30 LAB — CBC
HCT: 47.7 % — ABNORMAL HIGH (ref 36.0–46.0)
Hemoglobin: 15.7 g/dL — ABNORMAL HIGH (ref 12.0–15.0)
MCH: 30.3 pg (ref 26.0–34.0)
MCHC: 32.9 g/dL (ref 30.0–36.0)
MCV: 92.1 fL (ref 80.0–100.0)
Platelets: 91 10*3/uL — ABNORMAL LOW (ref 150–400)
RBC: 5.18 MIL/uL — ABNORMAL HIGH (ref 3.87–5.11)
RDW: 15.3 % (ref 11.5–15.5)
WBC: 5 10*3/uL (ref 4.0–10.5)
nRBC: 0 % (ref 0.0–0.2)

## 2023-06-30 LAB — GLUCOSE, CAPILLARY
Glucose-Capillary: 123 mg/dL — ABNORMAL HIGH (ref 70–99)
Glucose-Capillary: 126 mg/dL — ABNORMAL HIGH (ref 70–99)
Glucose-Capillary: 87 mg/dL (ref 70–99)

## 2023-06-30 LAB — BASIC METABOLIC PANEL
Anion gap: 14 (ref 5–15)
BUN: 12 mg/dL (ref 8–23)
CO2: 27 mmol/L (ref 22–32)
Calcium: 9.6 mg/dL (ref 8.9–10.3)
Chloride: 101 mmol/L (ref 98–111)
Creatinine, Ser: 1.08 mg/dL — ABNORMAL HIGH (ref 0.44–1.00)
GFR, Estimated: 56 mL/min — ABNORMAL LOW (ref >60)
Glucose, Bld: 120 mg/dL — ABNORMAL HIGH (ref 70–99)
Potassium: 3.5 mmol/L (ref 3.5–5.1)
Sodium: 142 mmol/L (ref 135–145)

## 2023-06-30 LAB — MAGNESIUM: Magnesium: 2.4 mg/dL (ref 1.7–2.4)

## 2023-06-30 LAB — PHOSPHORUS: Phosphorus: 2.8 mg/dL (ref 2.5–4.6)

## 2023-06-30 LAB — LEVETIRACETAM LEVEL: Levetiracetam Lvl: <2 ug/mL — ABNORMAL LOW (ref 10.0–40.0)

## 2023-06-30 MED ORDER — GADOBUTROL 1 MMOL/ML IV SOLN
8.0000 mL | Freq: Once | INTRAVENOUS | Status: AC | PRN
Start: 2023-06-30 — End: 2023-06-30
  Administered 2023-06-30: 8 mL via INTRAVENOUS

## 2023-06-30 NOTE — Procedures (Addendum)
Patient Name: Susan Francis  MRN: [id number]  Epilepsy Attending: Charlsie Quest  Referring Physician/Provider: Erick Blinks, MD  Duration: 06/29/2023 2346 to 06/30/2023 1346   Patient history: 67yo female who was BIB EMS after having a seizure. She was found at her neighbor's house, reported to have seizure lasting a couple of minutes. On admit to ED, patient confirmed that she takes her Keppra regularly and was able to give complete history. About an hour later, patient had a generalized tonic-clonic seizure lasting approximately 2 minutes, witnessed by RN. EEG to evaluate for seizure   Level of alertness: Awake, asleep   AEDs during EEG study: LEV, LCM   Technical aspects: This EEG study was done with scalp electrodes positioned according to the 10-20 International system of electrode placement. Electrical activity was reviewed with band pass filter of 1-70Hz , sensitivity of 7 uV/mm, display speed of 39mm/sec with a 60Hz  notched filter applied as appropriate. EEG data were recorded continuously and digitally stored.  Video monitoring was available and reviewed as appropriate.   Description: The posterior dominant rhythm consists of 8 Hz activity of moderate voltage (25-35 uV) seen predominantly in posterior head regions, symmetric and reactive to eye opening and eye closing. Sleep was characterized by vertex waves, sleep spindles (12 to 14 Hz), maximal frontocentral region. EEG showed intermittent generalized and maximal right temporo-parietal 3 to 6 Hz theta-delta slowing. Hyperventilation and photic stimulation were not performed.      ABNORMALITY - Intermittent slow, generalized and maximal right temporo-parietal   IMPRESSION: This study is suggestive of function arising from right temporoparietal region likely secondary nonobstructive abnormality.  Additionally there is mild diffuse encephalopathy. No seizures or epileptiform discharges were seen throughout the recording.    Sinia Antosh Annabelle Harman

## 2023-06-30 NOTE — Progress Notes (Signed)
EEG D/C'd. No skin breakdown.

## 2023-06-30 NOTE — Plan of Care (Signed)
Pt alert and oriented 4. Denies any pain or discomfort. IV is patent and saline locked. Continent of bowel and bladder. Pt ambulating on unit with PT/OT. PT ambulating to restroom throughout shift. Will continue with current plan of care.  Problem: Education: Goal: Expressions of having a comfortable level of knowledge regarding the disease process will increase Outcome: Progressing   Problem: Coping: Goal: Ability to adjust to condition or change in health will improve Outcome: Progressing Goal: Ability to identify appropriate support needs will improve Outcome: Progressing   Problem: Health Behavior/Discharge Planning: Goal: Compliance with prescribed medication regimen will improve Outcome: Progressing   Problem: Medication: Goal: Risk for medication side effects will decrease Outcome: Progressing   Problem: Clinical Measurements: Goal: Complications related to the disease process, condition or treatment will be avoided or minimized Outcome: Progressing Goal: Diagnostic test results will improve Outcome: Progressing   Problem: Safety: Goal: Verbalization of understanding the information provided will improve Outcome: Progressing   Problem: Self-Concept: Goal: Level of anxiety will decrease Outcome: Progressing Goal: Ability to verbalize feelings about condition will improve Outcome: Progressing   Problem: Education: Goal: Knowledge of General Education information will improve Description: Including pain rating scale, medication(s)/side effects and non-pharmacologic comfort measures Outcome: Progressing   Problem: Health Behavior/Discharge Planning: Goal: Ability to manage health-related needs will improve Outcome: Progressing   Problem: Clinical Measurements: Goal: Ability to maintain clinical measurements within normal limits will improve Outcome: Progressing Goal: Will remain free from infection Outcome: Progressing Goal: Diagnostic test results will  improve Outcome: Progressing Goal: Respiratory complications will improve Outcome: Progressing Goal: Cardiovascular complication will be avoided Outcome: Progressing   Problem: Activity: Goal: Risk for activity intolerance will decrease Outcome: Progressing   Problem: Nutrition: Goal: Adequate nutrition will be maintained Outcome: Progressing   Problem: Coping: Goal: Level of anxiety will decrease Outcome: Progressing   Problem: Elimination: Goal: Will not experience complications related to bowel motility Outcome: Progressing Goal: Will not experience complications related to urinary retention Outcome: Progressing   Problem: Pain Management: Goal: General experience of comfort will improve Outcome: Progressing   Problem: Safety: Goal: Ability to remain free from injury will improve Outcome: Progressing   Problem: Skin Integrity: Goal: Risk for impaired skin integrity will decrease Outcome: Progressing

## 2023-06-30 NOTE — Progress Notes (Signed)
PROGRESS NOTE    JERMANY WAN  XBJ:[id number] DOB: [birth year] DOA: 06/28/2023 PCP: Lorenda Ishihara, MD   Brief Narrative:  This 67 years old female with PMH significant for seizure disorder, subdural hematoma, GERD, hypothyroidism, alcohol use with history of withdrawal, alcohol cirrhosis, anxiety, depression, QT prolongation presented in the ED after seizure. Patient reportedly had witnessed seizure at neighbor's house that lasted 2 to 3 minutes. Patient does not remember events surrounding the seizures. Patient was initially postictal but subsequently improved en route to the ED. Patient had second seizure that was witnessed by her nurse in the ED.  Subsequently patient became apneic and required oxygen and there is a concern for aspiration.  Patient does have history of alcohol use,  last alcohol drink was 2 days ago.  In the ED chest x-ray showed no acute abnormality. Neurology was consulted recommended Keppra load and EEG monitoring.  Assessment & Plan:   Principal Problem:   Seizure (HCC) Active Problems:   GERD (gastroesophageal reflux disease)   Alcohol use disorder, severe, dependence (HCC)   Prolonged Q-T interval on ECG   Depression   Anxiety state   Acute respiratory failure with hypoxia (HCC)   Localization-related symptomatic epilepsy and epileptic syndromes with complex partial seizures, not intractable, with status epilepticus (HCC)   Alcoholic cirrhosis of liver without ascites (HCC)   History of alcohol withdrawal delirium   Aspiration into lower respiratory tract  Seizure: Patient had witnessed seizure at neighbor's house, followed by repeat seizures in the ED Patient became hypoxic following second seizure in the ED concern for aspiration Patient has prolonged postictal state after second seizure, but now back to baseline. CT head with No acute abnormality. Holding Wellbutrin as this can lower seizure threats to hold, UDS, UA. Appreciate neurology  recommendations and assistance. Continue with Keppra 1000 mg twice daily Seizure precautions Neuro checks Q 6hrs Continue as needed Ativan for repeat seizure. LTM EEG: No evidence of seizures. Neurology recommended  MRI brain to complete seizure workup.   Acute hypoxic respiratory failure: Suspected aspiration: Patient has required oxygen up to 12 L now weaned down to room air. Patient either has some sleep apnea or significant mouth breathing while asleep where oxygen requirement goes up to 10+ liters by facemask when asleep. Presumed aspiration event during second seizure. Successfully weaned down to room air. Monitor for signs/symptoms development of aspiration pneumonia.   Alcohol use: History of alcohol withdrawal Last drink 2 days ago.  Ethanol level negative. Continue home thiamine, folate Continue CIWA without Ativan for now.   Alcoholic cirrhosis: LFTs currently stable.  Platelets 86.  PT and INR not assessed. Continue to monitor   Anxiety and Depression: Holding Wellbutrin as above Continue home Celexa and Remeron.   Hypothyroidism Check TSH and free T4 Resume levothyroxine.  DVT prophylaxis: Lovenox Code Status: Full code Family Communication: No family at bedside Disposition Plan:   Status is: Inpatient Remains inpatient appropriate because:    Admitted for seizure episode,  patient loaded with Keppra,  EEG > no evidence of seizures.  Neurology recommended MRI for complete seizure workup    Consultants:  Neurology  Procedures: CT Head, EEG  Antimicrobials:  Anti-infectives (From admission, onward)    None      Subjective: Patient was seen and examined at bedside. Overnight events noted.   Patient reports doing much better. She denies any cough or shortness of breath. Denies any chest pain.  EEG is complete.  Objective: Vitals:   06/29/23 1555 06/29/23  2300 06/30/23 0447 06/30/23 0743  BP: 113/76 114/71 (!) 124/91 (!) 153/81  Pulse: 75    64  Resp:  18 18 18   Temp: 98.7 F (37.1 C) 98.5 F (36.9 C) 98 F (36.7 C) 98.5 F (36.9 C)  TempSrc: Axillary  Oral Oral  SpO2: 93% 92% 95% 95%  Weight:      Height:       No intake or output data in the 24 hours ending 06/30/23 1211  Filed Weights   06/28/23 0801  Weight: 84 kg    Examination:  General exam: Appears calm and comfortable, not in any acute distress. Respiratory system: CTA bilaterally.  respiratory effort normal.  RR 15 Cardiovascular system: S1 & S2 heard, RRR. No JVD, murmurs, rubs, gallops or clicks. No pedal edema. Gastrointestinal system: Abdomen is non distended, soft and non tender. Normal bowel sounds heard. Central nervous system: Alert and oriented x 3. No focal neurological deficits. Extremities: No edema, No cyanosis, No clubbing Skin: No rashes, lesions or ulcers Psychiatry: Judgement and insight appear normal. Mood & affect appropriate.     Data Reviewed: I have personally reviewed following labs and imaging studies  CBC: Recent Labs  Lab 06/28/23 0806 06/29/23 0601 06/30/23 0456  WBC 3.4* 4.9 5.0  NEUTROABS 2.4  --   --   HGB 15.3* 14.8 15.7*  HCT 46.0 44.2 47.7*  MCV 91.1 91.3 92.1  PLT 86* 89* 91*   Basic Metabolic Panel: Recent Labs  Lab 06/28/23 0900 06/29/23 0601 06/30/23 0456  NA 137 136 142  K 3.8 3.5 3.5  CL 105 104 101  CO2 22 20* 27  GLUCOSE 119* 123* 120*  BUN 9 9 12   CREATININE 0.87 0.84 1.08*  CALCIUM 8.7* 8.6* 9.6  MG  --   --  2.4  PHOS  --   --  2.8   GFR: Estimated Creatinine Clearance: 49.4 mL/min (A) (by C-G formula based on SCr of 1.08 mg/dL (H)). Liver Function Tests: Recent Labs  Lab 06/28/23 0900 06/29/23 0601  AST 23 23  ALT 16 15  ALKPHOS 63 58  BILITOT 1.2 1.2  PROT 6.7 6.5  ALBUMIN 3.9 3.9   No results for input(s): "LIPASE", "AMYLASE" in the last 168 hours. No results for input(s): "AMMONIA" in the last 168 hours. Coagulation Profile: No results for input(s): "INR", "PROTIME"  in the last 168 hours. Cardiac Enzymes: No results for input(s): "CKTOTAL", "CKMB", "CKMBINDEX", "TROPONINI" in the last 168 hours. BNP (last 3 results) No results for input(s): "PROBNP" in the last 8760 hours. HbA1C: No results for input(s): "HGBA1C" in the last 72 hours. CBG: Recent Labs  Lab 06/28/23 0831  GLUCAP 115*   Lipid Profile: No results for input(s): "CHOL", "HDL", "LDLCALC", "TRIG", "CHOLHDL", "LDLDIRECT" in the last 72 hours. Thyroid Function Tests: Recent Labs    06/28/23 1523  TSH 14.159*  FREET4 0.61   Anemia Panel: No results for input(s): "VITAMINB12", "FOLATE", "FERRITIN", "TIBC", "IRON", "RETICCTPCT" in the last 72 hours. Sepsis Labs: No results for input(s): "PROCALCITON", "LATICACIDVEN" in the last 168 hours.  No results found for this or any previous visit (from the past 240 hour(s)).  Radiology Studies: Overnight EEG with video  Result Date: 06/29/2023 Charlsie Quest, MD     06/30/2023 11:37 AM Patient Name: Susan Francis MRN: [id number] Epilepsy Attending: Charlsie Quest Referring Physician/Provider: Erick Blinks, MD Duration: 06/28/2023 2346 to 06/29/2023 2346 Patient history: 67yo female who was BIB EMS after having a seizure.  She was found at her neighbor's house, reported to have seizure lasting a couple of minutes. On admit to ED, patient confirmed that she takes her Keppra regularly and was able to give complete history. About an hour later, patient had a generalized tonic-clonic seizure lasting approximately 2 minutes, witnessed by RN. EEG to evaluate for seizure Level of alertness: Awake, asleep AEDs during EEG study: LEV, LCM Technical aspects: This EEG study was done with scalp electrodes positioned according to the 10-20 International system of electrode placement. Electrical activity was reviewed with band pass filter of 1-70Hz , sensitivity of 7 uV/mm, display speed of 26mm/sec with a 60Hz  notched filter applied as appropriate. EEG data  were recorded continuously and digitally stored.  Video monitoring was available and reviewed as appropriate. Description: The posterior dominant rhythm consists of 8 Hz activity of moderate voltage (25-35 uV) seen predominantly in posterior head regions, symmetric and reactive to eye opening and eye closing. Sleep was characterized by vertex waves, sleep spindles (12 to 14 Hz), maximal frontocentral region. EEG showed intermittent generalized and maximal right temporo-parietal 3 to 6 Hz theta-delta slowing. Hyperventilation and photic stimulation were not performed.   ABNORMALITY - Intermittent slow, generalized and maximal right temporo-parietal IMPRESSION: This study is suggestive of function arising from right temporoparietal region likely secondary nonobstructive abnormality.  Additionally there is mild diffuse encephalopathy. No seizures or epileptiform discharges were seen throughout the recording. Charlsie Quest   EEG adult  Result Date: 06/28/2023 Jefferson Fuel, MD     06/29/2023  1:45 PM Routine EEG Report WINSLET SHEARS is a 67 y.o. female with a history of seizures who is undergoing an EEG to evaluate for seizures. Report: This EEG was acquired with electrodes placed according to the International 10-20 electrode system (including Fp1, Fp2, F3, F4, C3, C4, P3, P4, O1, O2, T3, T4, T5, T6, A1, A2, Fz, Cz, Pz). The following electrodes were missing or displaced: none. The occipital dominant rhythm was 7 Hz. This activity is reactive to stimulation. Drowsiness was manifested by background fragmentation; deeper stages of sleep were identified by K complexes and sleep spindles. There was superimposed intermittent focal slowing over the right hemisphere. There were no interictal epileptiform discharges. There was one electrographic seizure recorded originating in the right fronto-temporo-parietal region which lasted approximately one minute. There were no clinical signs on video review of the event.  Photic stimulation and hyperventilation were not performed. Impression and clinical correlation: This EEG was obtained while awake and asleep and is abnormal due to: - mild diffuse slowing indicative of global cerebral dysfunction - superimposed intermittent focal slowing over the right hemisphere - one electrographic seizure without clinical signs originating in the right fronto-temporo-parietal region lasting approximately one minute Recommend long-term EEG monitoring. Discussed with Dr. Derry Lory. Bing Neighbors, MD [facility] [phone] If 7pm- 7am, please page neurology on call as listed in AMION.     Scheduled Meds:  citalopram  40 mg Oral Daily   enoxaparin (LOVENOX) injection  40 mg Subcutaneous Q24H   folic acid  1 mg Oral Daily   lacosamide  100 mg Oral BID   levETIRAcetam  1,500 mg Oral BID   levothyroxine  75 mcg Oral Q0600   mirtazapine  15 mg Oral QHS   sodium chloride flush  3 mL Intravenous Q12H   thiamine  100 mg Oral Daily   Continuous Infusions:   LOS: 1 day    Time spent: 35 mins    Willeen Niece, MD [facility]  If 7PM-7AM, please contact night-coverage
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
## 2023-06-30 NOTE — Progress Notes (Signed)
vLTM maintenance  All impedances below 10kohms.  No skin breakdown at FP1  FP2  F8  A2

## 2023-06-30 NOTE — Evaluation (Signed)
Occupational Therapy Evaluation Patient Details Name: Susan Francis MRN: [id number] DOB: Mar 14, 1956 Today's Date: 06/30/2023   History of Present Illness Pt is a 67 y/o F presenting to ED on 10/26 wtih seizure activity at neighbor's home, followed by repeat seizures in ED, subsequently becase apneic and required O2. EEG suggestive of R temporoparietal region function 2/2 nonobstructive abnormality and mild diffuse encephalopathy, MRI pending. PMH includes seizure disorder, subdural hematoma, GERD, hypothyroidism, alcohol use with hx of withdrawal, alcohol cirrhosis, anxiety, depression, QT prolongation.   Clinical Impression   Pt reports ind at baseline with ADLs/functional mobility, lives alone but has frequent support from a neighbor who's home she was at when the seizure occurred. Pt currently needing supervision -mod A for ADLs, supervision for bed mobility and CGA for transfers without AD. Pt with 2/4 DOE with hallway distance ambulation, SpO2 94% on RA, however pt reports feeling more fatigued than normal. Pt presenting with impairments listed below, will follow acutely. Recommend HHOT at d/c.       If plan is discharge home, recommend the following: A little help with walking and/or transfers;A little help with bathing/dressing/bathroom;Assistance with cooking/housework;A lot of help with bathing/dressing/bathroom;Assist for transportation;Direct supervision/assist for medications management;Direct supervision/assist for financial management    Functional Status Assessment  Patient has had a recent decline in their functional status and demonstrates the ability to make significant improvements in function in a reasonable and predictable amount of time.  Equipment Recommendations  None recommended by OT    Recommendations for Other Services PT consult     Precautions / Restrictions Precautions Precautions: Fall Precaution Comments: seizure Restrictions Weight Bearing Restrictions:  No      Mobility Bed Mobility Overal bed mobility: Needs Assistance Bed Mobility: Sit to Supine, Supine to Sit     Supine to sit: Supervision Sit to supine: Supervision        Transfers Overall transfer level: Needs assistance Equipment used: None Transfers: Sit to/from Stand Sit to Stand: Contact guard assist                  Balance Overall balance assessment: Needs assistance Sitting-balance support: Feet supported Sitting balance-Leahy Scale: Good     Standing balance support: During functional activity Standing balance-Leahy Scale: Fair Standing balance comment: mild instability                           ADL either performed or assessed with clinical judgement   ADL Overall ADL's : Needs assistance/impaired Eating/Feeding: Supervision/ safety   Grooming: Wash/dry hands;Supervision/safety   Upper Body Bathing: Minimal assistance   Lower Body Bathing: Moderate assistance   Upper Body Dressing : Minimal assistance   Lower Body Dressing: Moderate assistance   Toilet Transfer: Ambulation;Regular Toilet;Contact guard assist   Toileting- Clothing Manipulation and Hygiene: Supervision/safety       Functional mobility during ADLs: Supervision/safety       Vision   Additional Comments: reports poor vision, but can read clock and can functionally perform ADLs, reports vision is at baseline     Perception Perception: Not tested       Praxis Praxis: Not tested       Pertinent Vitals/Pain Pain Assessment Pain Assessment: No/denies pain     Extremity/Trunk Assessment Upper Extremity Assessment Upper Extremity Assessment: Generalized weakness;RUE deficits/detail RUE Deficits / Details: limited shoulder AROM from RTC injury 3/5 strength/ROM   Lower Extremity Assessment Lower Extremity Assessment: Defer to PT evaluation   Cervical /  Trunk Assessment Cervical / Trunk Assessment: Normal   Communication  Communication Communication: No apparent difficulties   Cognition Arousal: Alert Behavior During Therapy: WFL for tasks assessed/performed Overall Cognitive Status: No family/caregiver present to determine baseline cognitive functioning                                 General Comments: some STM deficits noted, with administration of SBT, likely close to baseline     General Comments  SpO2 94% on RA post session    Exercises     Shoulder Instructions      Home Living Family/patient expects to be discharged to:: Private residence Living Arrangements: Alone Available Help at Discharge: Family;Neighbor;Available PRN/intermittently Type of Home: Apartment Home Access: Stairs to enter Entrance Stairs-Number of Steps: 3 Entrance Stairs-Rails: Can reach both Home Layout: One level     Bathroom Shower/Tub: IT trainer: Standard     Home Equipment: None   Additional Comments: daughter is a Engineer, civil (consulting), lives ~20 mins away, neighbor is a paramedic who is retired, can be available to help during the day      Prior Functioning/Environment Prior Level of Function : Independent/Modified Independent             Mobility Comments: no AD use ADLs Comments: does not drive, daughter provides transportation, has pillbox for medications        OT Problem List: Decreased strength;Decreased range of motion;Decreased activity tolerance;Impaired balance (sitting and/or standing);Decreased cognition;Decreased safety awareness;Impaired UE functional use      OT Treatment/Interventions: Self-care/ADL training;Therapeutic exercise;Energy conservation;DME and/or AE instruction;Therapeutic activities;Visual/perceptual remediation/compensation;Patient/family education;Balance training    OT Goals(Current goals can be found in the care plan section) Acute Rehab OT Goals Patient Stated Goal: none stated OT Goal Formulation: With patient Time For Goal  Achievement: 07/14/23 Potential to Achieve Goals: Good ADL Goals Pt Will Perform Lower Body Dressing: Independently;sit to/from stand;sitting/lateral leans Pt Will Perform Tub/Shower Transfer: Tub transfer;Shower transfer;Independently;ambulating Additional ADL Goal #1: pt will recieve passing score on pillbox assessment in prep for ADLs Additional ADL Goal #2: pt will be able to stand x10 min for functional task in order to improve activity tolerance for ADLs  OT Frequency: Min 1X/week    Co-evaluation              AM-PAC OT "6 Clicks" Daily Activity     Outcome Measure Help from another person eating meals?: None Help from another person taking care of personal grooming?: A Little Help from another person toileting, which includes using toliet, bedpan, or urinal?: A Little Help from another person bathing (including washing, rinsing, drying)?: A Lot Help from another person to put on and taking off regular upper body clothing?: A Little Help from another person to put on and taking off regular lower body clothing?: A Lot 6 Click Score: 17   End of Session Equipment Utilized During Treatment: Gait belt Nurse Communication: Mobility status  Activity Tolerance: Patient tolerated treatment well Patient left: in bed;with call bell/phone within reach;with bed alarm set (transport in room to take pt to MRI)  OT Visit Diagnosis: Unsteadiness on feet (R26.81);Other abnormalities of gait and mobility (R26.89);Muscle weakness (generalized) (M62.81)                Time: 7829-5621 OT Time Calculation (min): 21 min Charges:  OT General Charges $OT Visit: 1 Visit OT Evaluation $OT Eval Moderate Complexity: 1 Mod  Lebron Quam, OTR/L SecureChat Preferred Acute Rehab [phone]   Dalphine Handing 06/30/2023, 3:52 PM

## 2023-06-30 NOTE — Plan of Care (Signed)
  Problem: Education: Goal: Expressions of having a comfortable level of knowledge regarding the disease process will increase Outcome: Progressing   Problem: Coping: Goal: Ability to adjust to condition or change in health will improve Outcome: Progressing Goal: Ability to identify appropriate support needs will improve Outcome: Progressing   Problem: Health Behavior/Discharge Planning: Goal: Compliance with prescribed medication regimen will improve Outcome: Progressing   Problem: Medication: Goal: Risk for medication side effects will decrease Outcome: Progressing   Problem: Clinical Measurements: Goal: Complications related to the disease process, condition or treatment will be avoided or minimized Outcome: Progressing Goal: Diagnostic test results will improve Outcome: Progressing   Problem: Safety: Goal: Verbalization of understanding the information provided will improve Outcome: Progressing   Problem: Health Behavior/Discharge Planning: Goal: Ability to manage health-related needs will improve Outcome: Progressing

## 2023-06-30 NOTE — Progress Notes (Addendum)
NEUROLOGY CONSULT FOLLOW UP NOTE   Date of service: June 30, 2023 Patient Name: Susan Francis MRN:  [id number] DOB:  [birth year]  Brief HPI  Susan Francis is a 67 y.o. female  has a past medical history of Abnormal platelets (HCC), Acute hyperactive alcohol withdrawal delirium (HCC) (04/08/2017), Acute, mixed level of activity, alcohol withdrawal delirium (HCC) (04/08/2017), Alcohol withdrawal syndrome with complication (HCC), Anxiety, Cholelithiasis, Colitis, GERD (gastroesophageal reflux disease), Hepatic cirrhosis (HCC), Hepatitis C, Nausea & vomiting (05/01/2014), Nonspecific abnormal electrocardiogram (ECG) (EKG), Seizure (HCC), Status epilepticus (HCC) (07/12/2014), Stomach ulcer, and Thyroid disease. who presented with seizure. On admit to ED, patient confirmed that she takes her Keppra regularly and was able to give complete history. About an hour later, patient had a generalized tonic-clonic seizure lasting approximately 2 minutes, witnessed by RN. She became apneic and required BVM support. She was given IV ativan and loaded with 1500mg  Keppra.. VS returned to normal post interventions, patient requiring supplemental oxygen through NRB. Neurology consulted for seizures.    Interval Hx/subjective   Remains on LTM EEG. Overnight read pending, subclinical seizure seen on EEG 10/26 for which LTM was placed.  No seizures on LTM overnight 10/26 - 10/27.  Patient complains of feeling tired overall but denies further seizure activity or other complaints at this time.    Vitals   Vitals:   06/29/23 1555 06/29/23 2300 06/30/23 0447 06/30/23 0743  BP: 113/76 114/71 (!) 124/91 (!) 153/81  Pulse: 75   64  Resp:  18 18 18   Temp: 98.7 F (37.1 C) 98.5 F (36.9 C) 98 F (36.7 C) 98.5 F (36.9 C)  TempSrc: Axillary  Oral Oral  SpO2: 93% 92% 95% 95%  Weight:      Height:        Body mass index is 33.87 kg/m.  Physical Exam   Constitutional: Appears well-developed and well-nourished.   Psych: Affect appropriate to situation. Patient is calm and cooperative with examination.  Eyes: No scleral injection.  HENT: No OP obstrucion. Poor dentition.  Head: Normocephalic, LTM EEG leads remain in place Cardiovascular: Normal rate and regular rhythm.  Respiratory: Normal respiratory effort on room air  GI: Soft.  No distension. There is no tenderness.  Skin: Scattered scabbing without drainage noted to bilateral upper extremities   Neurologic Examination   Mental status/Cognition: Alert, oriented to self, place, month and year, good attention.  Speech/language: Fluent, comprehension intact, object naming intact, repetition intact.  Cranial nerves:   CN II Pupils equal and reactive to light, no VF deficits    CN III,IV,VI EOM intact, no gaze preference or deviation, no nystagmus    CN V Facial sensation is symmetric to touch    CN VII Face is symmetric to movement   CN VIII Hearing is intact to voice   CN IX & X normal palatal elevation, no uvular deviation    CN XI Shoulder shrug is symmetric   CN XII midline tongue with no atrophy or fasciculations.   Motor:  Tone is normal. Bulk is normal. 5/5 strength present throughout with good effort.   Sensation: Intact and symmetrical throughout  Coordination:  - FNF and HKS are intact bilaterally  Labs and Diagnostic Imaging   CBC:  Recent Labs  Lab 06/28/23 0806 06/29/23 0601 06/30/23 0456  WBC 3.4* 4.9 5.0  NEUTROABS 2.4  --   --   HGB 15.3* 14.8 15.7*  HCT 46.0 44.2 47.7*  MCV 91.1 91.3 92.1  PLT 86* 89*  91*   Basic Metabolic Panel:  Lab Results  Component Value Date   NA 142 06/30/2023   K 3.5 06/30/2023   CO2 27 06/30/2023   GLUCOSE 120 (H) 06/30/2023   BUN 12 06/30/2023   CREATININE 1.08 (H) 06/30/2023   CALCIUM 9.6 06/30/2023   GFRNONAA 56 (L) 06/30/2023   GFRAA >60 07/25/2019   Lipid Panel: No results found for: "LDLCALC" HgbA1c:  Lab Results  Component Value Date   HGBA1C 5.2 07/17/2014    Urine Drug Screen:     Component Value Date/Time   LABOPIA NONE DETECTED 07/21/2017 2320   COCAINSCRNUR NONE DETECTED 07/21/2017 2320   LABBENZ NONE DETECTED 07/21/2017 2320   AMPHETMU NONE DETECTED 07/21/2017 2320   THCU POSITIVE (A) 07/21/2017 2320   LABBARB NONE DETECTED 07/21/2017 2320    Alcohol Level     Component Value Date/Time   ETH <10 06/28/2023 0806   INR  Lab Results  Component Value Date   INR 1.1 12/18/2021   APTT  Lab Results  Component Value Date   APTT 35 07/17/2014   AED levels:  Lab Results  Component Value Date   LEVETIRACETA <2.0 (L) 06/28/2023   CT Head without contrast(Personally reviewed): No acute intracranial findings.   rEEG read 10/26 pm:  - mild diffuse slowing indicative of global cerebral dysfunction - superimposed intermittent focal slowing over the left  hemisphere - one electrographic seizure without clinical signs originating in the left fronto-temporo-parietal region lasting approximately one minute  LTM 10/26 2346 to 10/27 0915: Suggestive of mild diffuse encephalopathy. No seizures or epileptiform discharges were seen throughout the recording.   Impression   67 y.o. female with PMH significant for Seizures (Keppra 1000mg  BID at home), Bipolar disorder, Anxiety, Hep C, Hepatic Cirrhosis, GERD, Colitis who was BIB EMS after having a seizure.Patient endorsed medication compliance. She is also on Wellbutrin which can lower seixure threshold, now on hold. EEG sowed subclinical seizures and she was continued on LTM.   Recommendations   - continue seizure precautions - continue Keppra  - continue Vimpat - continue to hold wellbutrin, outpatient PCP/Psychiatry follow-up for alternatives - d/c LTM - MRI brain with and without contrast; if negative for acute findings, patient can follow up with outpatient neurology for further AED adjustment and management - Patient counseled on abstaining from alcohol and expressed understanding -  Neurology will follow-up MRI brain, otherwise signing off. Discussed with primary team via secure chat  SEIZURE PRECAUTIONS Per [facility] statutes, patients with seizures are not allowed to drive until they have been seizure-free for six months.   Use caution when using heavy equipment or power tools. Avoid working on ladders or at heights. Take showers instead of baths. Ensure the water temperature is not too high on the home water heater. Do not go swimming alone. Do not lock yourself in a room alone (i.e. bathroom). When caring for infants or small children, sit down when holding, feeding, or changing them to minimize risk of injury to the child in the event you have a seizure. Maintain good sleep hygiene. Avoid alcohol.    If patient has another seizure, call 911 and bring them back to the ED if: A.  The seizure lasts longer than 5 minutes.      B.  The patient doesn't wake shortly after the seizure or has new problems such as difficulty seeing, speaking or moving following the seizure C.  The patient was injured during the seizure D.  The  patient has a temperature over 102 F (39C) E.  The patient vomited during the seizure and now is having trouble breathing  ______________________________________________________________________  Pt seen by Neuro NP/APP and later by MD. Note/plan to be edited by MD as needed.    Thank you for the opportunity to take part in the care of this patient. If you have any further questions, please contact the neurology consultation team on call. Updated oncall schedule is listed on AMION.  Signed,  Lanae Boast, AGACNP-BC [facility] Pager: [phone]  Attending Neurologist's note:  I personally saw this patient, gathering history, performing a neurologic examination, reviewing relevant labs, personally reviewing relevant imaging including head CT, and formulated the assessment and plan, adding the note above for completeness and  clarity to accurately reflect my thoughts  Brooke Dare MD-PhD [facility] [phone]  Available 7 AM to 7 PM, outside these hours please contact Neurologist on call listed on AMION

## 2023-06-30 NOTE — Plan of Care (Signed)
  Problem: Education: Goal: Expressions of having a comfortable level of knowledge regarding the disease process will increase Outcome: Progressing   Problem: Coping: Goal: Ability to adjust to condition or change in health will improve Outcome: Progressing   Problem: Health Behavior/Discharge Planning: Goal: Compliance with prescribed medication regimen will improve Outcome: Progressing   Problem: Medication: Goal: Risk for medication side effects will decrease Outcome: Progressing   Problem: Safety: Goal: Verbalization of understanding the information provided will improve Outcome: Progressing   Problem: Self-Concept: Goal: Level of anxiety will decrease Outcome: Progressing   Problem: Education: Goal: Knowledge of General Education information will improve Description: Including pain rating scale, medication(s)/side effects and non-pharmacologic comfort measures Outcome: Progressing   Problem: Activity: Goal: Risk for activity intolerance will decrease Outcome: Progressing   Problem: Coping: Goal: Level of anxiety will decrease Outcome: Progressing

## 2023-07-01 DIAGNOSIS — R569 Unspecified convulsions: Secondary | ICD-10-CM | POA: Diagnosis not present

## 2023-07-01 MED ORDER — PNEUMOCOCCAL 20-VAL CONJ VACC 0.5 ML IM SUSY: 0.5000 mL | PREFILLED_SYRINGE | INTRAMUSCULAR | Status: DC

## 2023-07-01 MED ORDER — LACOSAMIDE 100 MG PO TABS: 100.0000 mg | ORAL_TABLET | Freq: Two times a day (BID) | ORAL | 0 refills | Status: AC

## 2023-07-01 MED ORDER — INFLUENZA VAC A&B SURF ANT ADJ 0.5 ML IM SUSY: 0.5000 mL | PREFILLED_SYRINGE | INTRAMUSCULAR | Status: DC

## 2023-07-01 MED ORDER — LEVETIRACETAM 750 MG PO TABS: 1500.0000 mg | ORAL_TABLET | Freq: Two times a day (BID) | ORAL | 0 refills | Status: AC

## 2023-07-01 NOTE — TOC Transition Note (Signed)
Transition of Care [facility]) - CM/SW Discharge Note   Patient Details  Name: Susan Francis MRN: [id number] Date of Birth: [birth year]  Transition of Care [facility]) CM/SW Contact:  Kermit Balo, RN Phone Number: 07/01/2023, 1:45 PM   Clinical Narrative:     Pt is discharging home with home health services through [place]. Information on the AVS. [facility] will contact her for the first home visit. Pt has needed DME at home. Transportation services information on the AVS.  PCP arranged with appointment on AVS.  Meals on Wheels information left with the patient in her room.  Daughter transporting home.  Final next level of care: Home w Home Health Services Barriers to Discharge: No Barriers Identified   Patient Goals and CMS Choice CMS Medicare.gov Compare Post Acute Care list provided to:: Patient Represenative (must comment) Choice offered to / list presented to : Adult Children  Discharge Placement                         Discharge Plan and Services Additional resources added to the After Visit Summary for     Discharge Planning Services: CM Consult                      HH Arranged: OT, PT [facility] Agency: [facility] Date [facility] Agency Contacted: 07/01/23   Representative spoke with at [facility] Agency: Amy  Social Determinants of Health (SDOH) Interventions SDOH Screenings   Alcohol Screen: Medium Risk (07/07/2017)  Depression (PHQ2-9): Low Risk  (01/28/2020)  Tobacco Use: High Risk (06/28/2023)     Readmission Risk Interventions     No data to display

## 2023-07-01 NOTE — Discharge Instructions (Signed)
Advised to follow-up with primary care physician in 1 week. Advised to follow-up with neurology in 2 weeks for adjustment in her AEDs. Advised to continue Keppra 1500 mg p.o. twice daily. Advised to continue Vimpat 100 mg p.o. twice daily. Advised to abstain from drinking alcohol and other substances.

## 2023-07-01 NOTE — Evaluation (Signed)
Physical Therapy Evaluation Patient Details Name: Susan Francis MRN: [id number] DOB: 16-Oct-1955 Today's Date: 07/01/2023  History of Present Illness  Pt is a 67 y/o F presenting to ED on 10/26 wtih seizure activity at neighbor's home, followed by repeat seizures in ED, subsequently becase apneic and required O2. EEG suggestive of R temporoparietal region function 2/2 nonobstructive abnormality and mild diffuse encephalopathy, MRI pending. PMH includes seizure disorder, subdural hematoma, GERD, hypothyroidism, alcohol use with hx of withdrawal, alcohol cirrhosis, anxiety, depression, QT prolongation.  Clinical Impression  Pt is presenting at baseline level of functioning. Currently pt is Mod I for all functional mobility with slightly decreased gait velocity, mild balance deficits with decreased arm swing during gait. Pt was educated on the importance of frequent movement on discharge from acute hospital setting and encouraged to contact PCP for OPPT if balance deficits continue after 2-4 weeks. Pt stated understanding. Currently no recommended skilled physical therapy services on discharge from acute care hospital setting and pt will be discharge from skilled physical therapy services in acute care hospital setting. Please re-consult if further needs arise.         If plan is discharge home, recommend the following: Help with stairs or ramp for entrance     Equipment Recommendations None recommended by PT     Functional Status Assessment Patient has had a recent decline in their functional status and demonstrates the ability to make significant improvements in function in a reasonable and predictable amount of time.     Precautions / Restrictions Precautions Precautions: Fall Precaution Comments: seizure Restrictions Weight Bearing Restrictions: No      Mobility  Bed Mobility     General bed mobility comments: Sitting EOB at beginning and end of session. Pt states she sat EOB  independently with no concerns.    Transfers Overall transfer level: Modified independent Equipment used: None Transfers: Sit to/from Stand Sit to Stand: Modified independent (Device/Increase time)           General transfer comment: slight increased in BOS    Ambulation/Gait Ambulation/Gait assistance: Modified independent (Device/Increase time) Gait Distance (Feet): 250 Feet Assistive device: None Gait Pattern/deviations: WFL(Within Functional Limits), Wide base of support Gait velocity: slightly decreased Gait velocity interpretation: 1.31 - 2.62 ft/sec, indicative of limited community ambulator   General Gait Details: decreased arm swing. No LOB  Stairs Stairs: Yes Stairs assistance: Modified independent (Device/Increase time) Stair Management: No rails, Alternating pattern, Forwards Number of Stairs: 4 General stair comments: slight increase in time with descending and slight increase in BOS use of UE for balance without touching rails.      Balance Overall balance assessment: Mild deficits observed, not formally tested Sitting-balance support: Feet supported Sitting balance-Leahy Scale: Good     Standing balance support: During functional activity Standing balance-Leahy Scale: Fair         Pertinent Vitals/Pain Pain Assessment Pain Assessment: No/denies pain    Home Living Family/patient expects to be discharged to:: Private residence Living Arrangements: Alone Available Help at Discharge: Family;Neighbor;Available PRN/intermittently Type of Home: Apartment Home Access: Stairs to enter Entrance Stairs-Rails: Can reach both Entrance Stairs-Number of Steps: 3   Home Layout: One level Home Equipment: None Additional Comments: daughter is a Engineer, civil (consulting), lives ~20 mins away, neighbor is a paramedic    Prior Function Prior Level of Function : Independent/Modified Independent     Mobility Comments: no AD use ADLs Comments: does not drive, daughter provides  transportation, has pillbox for medications  Extremity/Trunk Assessment   Upper Extremity Assessment Upper Extremity Assessment: Defer to OT evaluation    Lower Extremity Assessment Lower Extremity Assessment: Overall WFL for tasks assessed    Cervical / Trunk Assessment Cervical / Trunk Assessment: Normal  Communication   Communication Communication: No apparent difficulties  Cognition Arousal: Alert Behavior During Therapy: WFL for tasks assessed/performed Overall Cognitive Status: Within Functional Limits for tasks assessed          General Comments General comments (skin integrity, edema, etc.): Pt states she will have people with her most of the time and neighbors who can check on her. Discussed mobilizing every 1-2 hours while at home and once a day trying to take a walk with a support person to improve mobility.        Assessment/Plan    PT Assessment Patient does not need any further PT services         PT Goals (Current goals can be found in the Care Plan section)  Acute Rehab PT Goals Patient Stated Goal: to return home PT Goal Formulation: With patient Time For Goal Achievement: 07/15/23 Potential to Achieve Goals: Good     AM-PAC PT "6 Clicks" Mobility  Outcome Measure Help needed turning from your back to your side while in a flat bed without using bedrails?: None Help needed moving from lying on your back to sitting on the side of a flat bed without using bedrails?: None Help needed moving to and from a bed to a chair (including a wheelchair)?: None Help needed standing up from a chair using your arms (e.g., wheelchair or bedside chair)?: None Help needed to walk in hospital room?: None Help needed climbing 3-5 steps with a railing? : None 6 Click Score: 24    End of Session Equipment Utilized During Treatment: Gait belt Activity Tolerance: Patient tolerated treatment well Patient left: in bed;with call bell/phone within reach Nurse  Communication: Mobility status      Time: 1130-1140 PT Time Calculation (min) (ACUTE ONLY): 10 min   Charges:   PT Evaluation $PT Eval Low Complexity: 1 Low   PT General Charges $$ ACUTE PT VISIT: 1 Visit        Harrel Carina, DPT, CLT  Acute Rehabilitation Services Office: [phone] (Secure chat preferred)   Claudia Desanctis 07/01/2023, 11:46 AM

## 2023-07-01 NOTE — Plan of Care (Signed)
MRI brain personally reviewed, agree with radiology:   1. No evidence of acute intracranial abnormality. 2. Unchanged complex cystic pituitary lesion. 3.  Cerebral atrophy (ICD10-G31.9).  Neurology will sign off at this time, please do not hesitate to reach out if other questions or concerns arise.  Discussed with Dr. Idelle Leech yesterday via secure chat

## 2023-07-01 NOTE — Plan of Care (Signed)
Pt alert and oriented x 4. Iv is patent and saline locked, removed for discharge. Denies any pain or discomfort. Continent of bowel and bladder. BM this shift. Discharge instructions completed with pt. Pt stable upon discharge.  Problem: Education: Goal: Expressions of having a comfortable level of knowledge regarding the disease process will increase Outcome: Progressing   Problem: Coping: Goal: Ability to adjust to condition or change in health will improve Outcome: Progressing Goal: Ability to identify appropriate support needs will improve Outcome: Progressing   Problem: Health Behavior/Discharge Planning: Goal: Compliance with prescribed medication regimen will improve Outcome: Progressing   Problem: Medication: Goal: Risk for medication side effects will decrease Outcome: Progressing   Problem: Clinical Measurements: Goal: Complications related to the disease process, condition or treatment will be avoided or minimized Outcome: Progressing Goal: Diagnostic test results will improve Outcome: Progressing   Problem: Safety: Goal: Verbalization of understanding the information provided will improve Outcome: Progressing   Problem: Self-Concept: Goal: Level of anxiety will decrease Outcome: Progressing Goal: Ability to verbalize feelings about condition will improve Outcome: Progressing   Problem: Education: Goal: Knowledge of General Education information will improve Description: Including pain rating scale, medication(s)/side effects and non-pharmacologic comfort measures Outcome: Progressing   Problem: Health Behavior/Discharge Planning: Goal: Ability to manage health-related needs will improve Outcome: Progressing   Problem: Clinical Measurements: Goal: Ability to maintain clinical measurements within normal limits will improve Outcome: Progressing Goal: Will remain free from infection Outcome: Progressing Goal: Diagnostic test results will improve Outcome:  Progressing Goal: Respiratory complications will improve Outcome: Progressing Goal: Cardiovascular complication will be avoided Outcome: Progressing   Problem: Activity: Goal: Risk for activity intolerance will decrease Outcome: Progressing   Problem: Nutrition: Goal: Adequate nutrition will be maintained Outcome: Progressing   Problem: Coping: Goal: Level of anxiety will decrease Outcome: Progressing   Problem: Elimination: Goal: Will not experience complications related to bowel motility Outcome: Progressing Goal: Will not experience complications related to urinary retention Outcome: Progressing   Problem: Pain Management: Goal: General experience of comfort will improve Outcome: Progressing   Problem: Safety: Goal: Ability to remain free from injury will improve Outcome: Progressing   Problem: Skin Integrity: Goal: Risk for impaired skin integrity will decrease Outcome: Progressing

## 2023-07-01 NOTE — Discharge Summary (Signed)
Physician Discharge Summary  Susan Francis:[id number] DOB: September 07, 1955 DOA: 06/28/2023  PCP: Lorenda Ishihara, MD  Admit date: 06/28/2023  Discharge date: 07/01/2023  Admitted From: Home  Disposition: Home  Recommendations for Outpatient Follow-up:  Follow up with PCP in 1-2 weeks. Please obtain BMP/CBC in one week. Advised to follow-up with Neurology in 2 weeks for adjustment in her AEDs. Advised to continue Keppra 1500 mg p.o. twice daily. Advised to continue Vimpat 100 mg p.o. twice daily. Advised to abstain from drinking alcohol and other substances.  Home Health:None Equipment/Devices:None  Discharge Condition: Stable CODE STATUS:Full code Diet recommendation: Heart Healthy   Brief [facility] Course: This 67 years old female with PMH significant for seizure disorder, subdural hematoma, GERD, hypothyroidism, alcohol use with history of withdrawal, alcohol cirrhosis, anxiety, depression, QT prolongation presented in the ED after seizure. Patient reportedly had witnessed seizure at neighbor's house that lasted 2 to 3 minutes. Patient does not remember events surrounding the seizures. Patient was initially postictal but subsequently improved en route to the ED. Patient had second seizure that was witnessed by her nurse in the ED. Subsequently patient became apneic and required oxygen and there is a concern for aspiration. Patient does have history of alcohol use, last alcohol drink was 2 days ago. In the ED chest x-ray showed no acute abnormality. Neurology was consulted recommended Keppra load and EEG monitoring.  MRI brain> no acute intracranial abnormality found.  EEG shows no evidence of seizures or epileptiform discharges noted.  Neurologist signed off , recommended patient should continue Keppra and Vimpat.  Keppra dose was increased from 1000 to 1500 mg p.o. twice daily.  Advised to follow-up with neurology for adjustment in her antiepileptic drugs.  Patient also  counseled on refraining from drinking alcohol and other substances.  Patient's daughter was informed.   Discharge Diagnoses:  Principal Problem:   Seizure (HCC) Active Problems:   GERD (gastroesophageal reflux disease)   Alcohol use disorder, severe, dependence (HCC)   Prolonged Q-T interval on ECG   Depression   Anxiety state   Acute respiratory failure with hypoxia (HCC)   Localization-related symptomatic epilepsy and epileptic syndromes with complex partial seizures, not intractable, with status epilepticus (HCC)   Alcoholic cirrhosis of liver without ascites (HCC)   History of alcohol withdrawal delirium   Aspiration into lower respiratory tract  Seizure: Patient had witnessed seizure at neighbor's house, followed by repeat seizures in the ED Patient became hypoxic following second seizure in the ED concern for aspiration Patient has prolonged postictal state after second seizure, but now back to baseline. CT head with No acute abnormality. Holding Wellbutrin as this can lower seizure threats to hold, UDS, UA. Appreciate neurology recommendations and assistance. Continue with Keppra 1000 mg twice daily Seizure precautions Neuro checks Q 6hrs Continue as needed Ativan for repeat seizure. LTM EEG: No evidence of seizures. MRI brain with no acute intracranial abnormality found. Neurology signed off , recommended to continue Keppra and Vimpat.   Acute hypoxic respiratory failure: Suspected aspiration: Patient has required oxygen up to 12 L now weaned down to room air. Patient either has some sleep apnea or significant mouth breathing while asleep where oxygen requirement goes up to 10+ liters by facemask when asleep. Presumed aspiration event during second seizure. Successfully weaned down to room air. Monitor for signs/symptoms development of aspiration pneumonia. No need for antibiotics.   Alcohol use: History of alcohol withdrawal Last drink 2 days ago.  Ethanol level  negative. Continue home thiamine,  folate Continue CIWA without Ativan for now.   Alcoholic cirrhosis: LFTs currently stable.  Platelets 86.  PT and INR not assessed. Continue to monitor   Anxiety and Depression: Holding Wellbutrin as above Continue home Celexa and Remeron.   Hypothyroidism Check TSH and free T4 Resume levothyroxine.  Discharge Instructions  Discharge Instructions     Call MD for:  difficulty breathing, headache or visual disturbances   Complete by: As directed    Call MD for:  persistant dizziness or light-headedness   Complete by: As directed    Diet - low sodium heart healthy   Complete by: As directed    Diet Carb Modified   Complete by: As directed    Discharge instructions   Complete by: As directed    Advised to follow-up with primary care physician in 1 week. Advised to follow-up with neurology in 2 weeks for adjustment in her AEDs. Advised to continue Keppra 1500 mg p.o. twice daily. Advised to continue Vimpat 100 mg p.o. twice daily. Advised to abstain from drinking alcohol and other substances.   Increase activity slowly   Complete by: As directed       Allergies as of 07/01/2023       Reactions   Codeine Rash   Penicillins Nausea Only, Rash   Adhesive [tape] Hives, Other (See Comments)   Skin peels off (paper tape is ok)   Protonix [pantoprazole Sodium] Itching   Wound Dressing Adhesive Other (See Comments)   Skin burn and tears.   Bacitracin-polymyxin B Rash   Oxycontin [oxycodone] Hives, Rash   Pt states tolerating IR oxycodone fine        Medication List     STOP taking these medications    buPROPion 150 MG 24 hr tablet Commonly known as: WELLBUTRIN XL       TAKE these medications    citalopram 40 MG tablet Commonly known as: CELEXA Take 40 mg by mouth daily.   folic acid 1 MG tablet Commonly known as: FOLVITE Take 1 mg by mouth daily.   ibuprofen 200 MG tablet Commonly known as: ADVIL Take 800 mg by mouth  every 6 (six) hours as needed for headache.   Lacosamide 100 MG Tabs Take 1 tablet (100 mg total) by mouth 2 (two) times daily for 15 days.   levETIRAcetam 750 MG tablet Commonly known as: KEPPRA Take 2 tablets (1,500 mg total) by mouth 2 (two) times daily. What changed:  medication strength how much to take   levothyroxine 75 MCG tablet Commonly known as: SYNTHROID Take 75 mcg by mouth daily.   mirtazapine 15 MG tablet Commonly known as: Remeron Take 1 tablet (15 mg total) by mouth at bedtime.         Follow-up Information     [employer] [facility] Follow up.   Contact information: [employer]: [phone]  [employer]: [phone]  [employer]: [phone]        Lorenda Ishihara, MD Follow up in 1 week(s).   Specialty: Internal Medicine Contact information: [address] [place] [address] [phone]         [facility] Follow up in 2 week(s).   Contact information: [address] [place] [address] [phone]                Allergies  Allergen Reactions   Codeine Rash   Penicillins Nausea Only and Rash   Adhesive [Tape] Hives and Other (See Comments)  Skin peels off (paper tape is ok)   Protonix [Pantoprazole Sodium] Itching   Wound Dressing Adhesive Other (See Comments)    Skin burn and tears.   Bacitracin-Polymyxin B Rash   Oxycontin [Oxycodone] Hives and Rash    Pt states tolerating IR oxycodone fine     Consultations: Neurology   Procedures/Studies: MR BRAIN W WO CONTRAST  Result Date: 06/30/2023 CLINICAL DATA:  Seizure disorder, clinical change EXAM: MRI HEAD WITHOUT AND WITH CONTRAST TECHNIQUE: Multiplanar, multiecho pulse sequences of the brain and surrounding structures were obtained without and with intravenous contrast. CONTRAST:  8mL GADAVIST GADOBUTROL 1 MMOL/ML IV SOLN COMPARISON:  MRI head 04/19/2019. FINDINGS: Brain: No evidence of  acute infarct, acute hemorrhage, midline shift or hydrocephalus. Cerebral atrophy. No pathologic enhancement. Similar size and appearance of a T1 hyperintense pituitary lesion, compatible with complex cyst. Similar suprasellar extension with the mass coming ion close proximity to but not compressing the optic chiasm. Vascular: Major arterial flow voids are maintained. Skull and upper cervical spine: Normal marrow signal. Sinuses/Orbits: Negative. IMPRESSION: 1. No evidence of acute intracranial abnormality. 2. Unchanged complex cystic pituitary lesion. 3.  Cerebral atrophy (ICD10-G31.9). Electronically Signed   By: Feliberto Harts M.D.   On: 06/30/2023 21:14   Overnight EEG with video  Result Date: 06/29/2023 Charlsie Quest, MD     06/30/2023 11:37 AM Patient Name: ELISABELLA SCHMITZ MRN: [id number] Epilepsy Attending: Charlsie Quest Referring Physician/Provider: Erick Blinks, MD Duration: 06/28/2023 2346 to 06/29/2023 2346 Patient history: 67yo female who was BIB EMS after having a seizure. She was found at her neighbor's house, reported to have seizure lasting a couple of minutes. On admit to ED, patient confirmed that she takes her Keppra regularly and was able to give complete history. About an hour later, patient had a generalized tonic-clonic seizure lasting approximately 2 minutes, witnessed by RN. EEG to evaluate for seizure Level of alertness: Awake, asleep AEDs during EEG study: LEV, LCM Technical aspects: This EEG study was done with scalp electrodes positioned according to the 10-20 International system of electrode placement. Electrical activity was reviewed with band pass filter of 1-70Hz , sensitivity of 7 uV/mm, display speed of 58mm/sec with a 60Hz  notched filter applied as appropriate. EEG data were recorded continuously and digitally stored.  Video monitoring was available and reviewed as appropriate. Description: The posterior dominant rhythm consists of 8 Hz activity of moderate  voltage (25-35 uV) seen predominantly in posterior head regions, symmetric and reactive to eye opening and eye closing. Sleep was characterized by vertex waves, sleep spindles (12 to 14 Hz), maximal frontocentral region. EEG showed intermittent generalized and maximal right temporo-parietal 3 to 6 Hz theta-delta slowing. Hyperventilation and photic stimulation were not performed.   ABNORMALITY - Intermittent slow, generalized and maximal right temporo-parietal IMPRESSION: This study is suggestive of function arising from right temporoparietal region likely secondary nonobstructive abnormality.  Additionally there is mild diffuse encephalopathy. No seizures or epileptiform discharges were seen throughout the recording. Charlsie Quest   EEG adult  Result Date: 06/28/2023 Jefferson Fuel, MD     06/29/2023  1:45 PM Routine EEG Report COLLYN OWYANG is a 67 y.o. female with a history of seizures who is undergoing an EEG to evaluate for seizures. Report: This EEG was acquired with electrodes placed according to the International 10-20 electrode system (including Fp1, Fp2, F3, F4, C3, C4, P3, P4, O1, O2, T3, T4, T5, T6, A1, A2, Fz, Cz, Pz). The following electrodes were missing or  displaced: none. The occipital dominant rhythm was 7 Hz. This activity is reactive to stimulation. Drowsiness was manifested by background fragmentation; deeper stages of sleep were identified by K complexes and sleep spindles. There was superimposed intermittent focal slowing over the right hemisphere. There were no interictal epileptiform discharges. There was one electrographic seizure recorded originating in the right fronto-temporo-parietal region which lasted approximately one minute. There were no clinical signs on video review of the event. Photic stimulation and hyperventilation were not performed. Impression and clinical correlation: This EEG was obtained while awake and asleep and is abnormal due to: - mild diffuse slowing  indicative of global cerebral dysfunction - superimposed intermittent focal slowing over the right hemisphere - one electrographic seizure without clinical signs originating in the right fronto-temporo-parietal region lasting approximately one minute Recommend long-term EEG monitoring. Discussed with Dr. Derry Lory. Bing Neighbors, MD [facility] [phone] If 7pm- 7am, please page neurology on call as listed in AMION.   CT Head Wo Contrast  Result Date: 06/28/2023 CLINICAL DATA:  Seizure disorder EXAM: CT HEAD WITHOUT CONTRAST TECHNIQUE: Contiguous axial images were obtained from the base of the skull through the vertex without intravenous contrast. RADIATION DOSE REDUCTION: This exam was performed according to the departmental dose-optimization program which includes automated exposure control, adjustment of the mA and/or kV according to patient size and/or use of iterative reconstruction technique. 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 Mass effect on the optic chiasm remains equivocal. Otherwise, the brainstem, cerebellum, cerebral peduncles, thalamus, basal ganglia, basilar cisterns, and ventricular system appear within normal limits. No intracranial hemorrhage, additional mass lesion, or acute CVA. Vascular: Unremarkable Skull: Unremarkable Sinuses/Orbits: Mild chronic ethmoid sinusitis anteriorly and mild chronic adjacent frontal sinusitis. Other: No supplemental non-categorized findings. IMPRESSION: 1. No acute intracranial findings. 2. 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 Mass effect on the optic chiasm remains equivocal. 3. Mild chronic ethmoid and frontal sinusitis. Electronically Signed    By: Gaylyn Rong M.D.   On: 06/28/2023 11:36   DG Chest Portable 1 View  Result Date: 06/28/2023 CLINICAL DATA:  Seizure, hypoxia, possible aspiration. EXAM: PORTABLE CHEST 1 VIEW COMPARISON:  Chest radiograph dated 12/18/2021. FINDINGS: The heart size and mediastinal contours are within normal limits. There is mild lower lung predominant interstitial opacities. No pleural effusion or pneumothorax. The visualized skeletal structures are unremarkable. IMPRESSION: Mild lower lung predominant interstitial opacities may reflect atelectasis or aspiration. Electronically Signed   By: Romona Curls M.D.   On: 06/28/2023 11:07    Subjective: Patient was seen and examined at bedside.  Overnight events noted. Patient feels much better.  She remains on room air for last 2 days.   Patient has ambulated in the hallway without any difficulty breathing or dizziness.  Patient is being discharged home.  Discharge Exam: Vitals:   07/01/23 0902 07/01/23 1145  BP: (!) 172/86 131/81  Pulse: 79 80  Resp: 18 18  Temp: 98.3 F (36.8 C) 98.9 F (37.2 C)  SpO2: 99% 97%   Vitals:   06/30/23 2342 07/01/23 0325 07/01/23 0902 07/01/23 1145  BP: 123/63 119/75 (!) 172/86 131/81  Pulse: 63 (!) 58 79 80  Resp: 18 18 18 18   Temp: 98.1 F (36.7 C) 98.7 F (37.1 C) 98.3 F (36.8 C) 98.9  F (37.2 C)  TempSrc: Oral Oral Oral Oral  SpO2: 91% 93% 99% 97%  Weight:      Height:        General: Pt is alert, awake, not in acute distress Cardiovascular: RRR, S1/S2 +, no rubs, no gallops Respiratory: CTA bilaterally, no wheezing, no rhonchi Abdominal: Soft, NT, ND, bowel sounds + Extremities: no edema, no cyanosis    The results of significant diagnostics from this hospitalization (including imaging, microbiology, ancillary and laboratory) are listed below for reference.     Microbiology: No results found for this or any previous visit (from the past 240 hour(s)).   Labs: BNP (last 3 results) No  results for input(s): "BNP" in the last 8760 hours. Basic Metabolic Panel: Recent Labs  Lab 06/28/23 0900 06/29/23 0601 06/30/23 0456  NA 137 136 142  K 3.8 3.5 3.5  CL 105 104 101  CO2 22 20* 27  GLUCOSE 119* 123* 120*  BUN 9 9 12   CREATININE 0.87 0.84 1.08*  CALCIUM 8.7* 8.6* 9.6  MG  --   --  2.4  PHOS  --   --  2.8   Liver Function Tests: Recent Labs  Lab 06/28/23 0900 06/29/23 0601  AST 23 23  ALT 16 15  ALKPHOS 63 58  BILITOT 1.2 1.2  PROT 6.7 6.5  ALBUMIN 3.9 3.9   No results for input(s): "LIPASE", "AMYLASE" in the last 168 hours. No results for input(s): "AMMONIA" in the last 168 hours. CBC: Recent Labs  Lab 06/28/23 0806 06/29/23 0601 06/30/23 0456  WBC 3.4* 4.9 5.0  NEUTROABS 2.4  --   --   HGB 15.3* 14.8 15.7*  HCT 46.0 44.2 47.7*  MCV 91.1 91.3 92.1  PLT 86* 89* 91*   Cardiac Enzymes: No results for input(s): "CKTOTAL", "CKMB", "CKMBINDEX", "TROPONINI" in the last 168 hours. BNP: Invalid input(s): "POCBNP" CBG: Recent Labs  Lab 06/28/23 0831 06/30/23 1231 06/30/23 2003 06/30/23 2342  GLUCAP 115* 87 126* 123*   D-Dimer No results for input(s): "DDIMER" in the last 72 hours. Hgb A1c No results for input(s): "HGBA1C" in the last 72 hours. Lipid Profile No results for input(s): "CHOL", "HDL", "LDLCALC", "TRIG", "CHOLHDL", "LDLDIRECT" in the last 72 hours. Thyroid function studies Recent Labs    06/28/23 1523  TSH 14.159*   Anemia work up No results for input(s): "VITAMINB12", "FOLATE", "FERRITIN", "TIBC", "IRON", "RETICCTPCT" in the last 72 hours. Urinalysis    Component Value Date/Time   COLORURINE YELLOW 12/18/2021 1128   APPEARANCEUR CLEAR 12/18/2021 1128   LABSPEC 1.044 (H) 12/18/2021 1128   PHURINE 6.0 12/18/2021 1128   GLUCOSEU NEGATIVE 12/18/2021 1128   HGBUR SMALL (A) 12/18/2021 1128   BILIRUBINUR NEGATIVE 12/18/2021 1128   KETONESUR 5 (A) 12/18/2021 1128   PROTEINUR 100 (A) 12/18/2021 1128   UROBILINOGEN 0.2  05/14/2015 0127   NITRITE NEGATIVE 12/18/2021 1128   LEUKOCYTESUR NEGATIVE 12/18/2021 1128   Sepsis Labs Recent Labs  Lab 06/28/23 0806 06/29/23 0601 06/30/23 0456  WBC 3.4* 4.9 5.0   Microbiology No results found for this or any previous visit (from the past 240 hour(s)).   Time coordinating discharge: Over 30 minutes  SIGNED:   Willeen Niece, MD  [facility] 07/01/2023, 11:53 AM Pager   If 7PM-7AM, please contact night-coverage

## 2023-07-03 DIAGNOSIS — K219 Gastro-esophageal reflux disease without esophagitis: Secondary | ICD-10-CM | POA: Diagnosis not present

## 2023-07-03 DIAGNOSIS — E039 Hypothyroidism, unspecified: Secondary | ICD-10-CM | POA: Diagnosis not present

## 2023-07-03 DIAGNOSIS — F10230 Alcohol dependence with withdrawal, uncomplicated: Secondary | ICD-10-CM | POA: Diagnosis not present

## 2023-07-03 DIAGNOSIS — F32A Depression, unspecified: Secondary | ICD-10-CM | POA: Diagnosis not present

## 2023-07-03 DIAGNOSIS — F1020 Alcohol dependence, uncomplicated: Secondary | ICD-10-CM | POA: Diagnosis not present

## 2023-07-03 DIAGNOSIS — K7030 Alcoholic cirrhosis of liver without ascites: Secondary | ICD-10-CM | POA: Diagnosis not present

## 2023-07-03 DIAGNOSIS — F419 Anxiety disorder, unspecified: Secondary | ICD-10-CM | POA: Diagnosis not present

## 2023-07-03 DIAGNOSIS — G40209 Localization-related (focal) (partial) symptomatic epilepsy and epileptic syndromes with complex partial seizures, not intractable, without status epilepticus: Secondary | ICD-10-CM | POA: Diagnosis not present

## 2023-07-03 DIAGNOSIS — J9601 Acute respiratory failure with hypoxia: Secondary | ICD-10-CM | POA: Diagnosis not present

## 2023-07-04 ENCOUNTER — Telehealth: Payer: Self-pay | Admitting: Neurology

## 2023-07-04 DIAGNOSIS — G40209 Localization-related (focal) (partial) symptomatic epilepsy and epileptic syndromes with complex partial seizures, not intractable, without status epilepticus: Secondary | ICD-10-CM | POA: Diagnosis not present

## 2023-07-04 DIAGNOSIS — F419 Anxiety disorder, unspecified: Secondary | ICD-10-CM | POA: Diagnosis not present

## 2023-07-04 DIAGNOSIS — F10230 Alcohol dependence with withdrawal, uncomplicated: Secondary | ICD-10-CM | POA: Diagnosis not present

## 2023-07-04 DIAGNOSIS — F1020 Alcohol dependence, uncomplicated: Secondary | ICD-10-CM | POA: Diagnosis not present

## 2023-07-04 DIAGNOSIS — K7030 Alcoholic cirrhosis of liver without ascites: Secondary | ICD-10-CM | POA: Diagnosis not present

## 2023-07-04 DIAGNOSIS — K219 Gastro-esophageal reflux disease without esophagitis: Secondary | ICD-10-CM | POA: Diagnosis not present

## 2023-07-04 DIAGNOSIS — J9601 Acute respiratory failure with hypoxia: Secondary | ICD-10-CM | POA: Diagnosis not present

## 2023-07-04 DIAGNOSIS — E039 Hypothyroidism, unspecified: Secondary | ICD-10-CM | POA: Diagnosis not present

## 2023-07-04 DIAGNOSIS — F32A Depression, unspecified: Secondary | ICD-10-CM | POA: Diagnosis not present

## 2023-07-04 NOTE — Telephone Encounter (Signed)
[facility] - verbal orders Continue physical therapy 2x week for 3 weeks and 1x week for 1 week

## 2023-07-04 NOTE — Telephone Encounter (Signed)
Betsy from [facility] - verbal orders given for Continue physical therapy 2x week for 3 weeks and 1x week for 1 week

## 2023-07-07 DIAGNOSIS — F419 Anxiety disorder, unspecified: Secondary | ICD-10-CM | POA: Diagnosis not present

## 2023-07-07 DIAGNOSIS — F32A Depression, unspecified: Secondary | ICD-10-CM | POA: Diagnosis not present

## 2023-07-07 DIAGNOSIS — K219 Gastro-esophageal reflux disease without esophagitis: Secondary | ICD-10-CM | POA: Diagnosis not present

## 2023-07-07 DIAGNOSIS — K7030 Alcoholic cirrhosis of liver without ascites: Secondary | ICD-10-CM | POA: Diagnosis not present

## 2023-07-07 DIAGNOSIS — J9601 Acute respiratory failure with hypoxia: Secondary | ICD-10-CM | POA: Diagnosis not present

## 2023-07-07 DIAGNOSIS — F10230 Alcohol dependence with withdrawal, uncomplicated: Secondary | ICD-10-CM | POA: Diagnosis not present

## 2023-07-07 DIAGNOSIS — G40209 Localization-related (focal) (partial) symptomatic epilepsy and epileptic syndromes with complex partial seizures, not intractable, without status epilepticus: Secondary | ICD-10-CM | POA: Diagnosis not present

## 2023-07-07 DIAGNOSIS — F1020 Alcohol dependence, uncomplicated: Secondary | ICD-10-CM | POA: Diagnosis not present

## 2023-07-07 DIAGNOSIS — E039 Hypothyroidism, unspecified: Secondary | ICD-10-CM | POA: Diagnosis not present

## 2023-07-09 DIAGNOSIS — E039 Hypothyroidism, unspecified: Secondary | ICD-10-CM | POA: Diagnosis not present

## 2023-07-09 DIAGNOSIS — F32A Depression, unspecified: Secondary | ICD-10-CM | POA: Diagnosis not present

## 2023-07-09 DIAGNOSIS — K219 Gastro-esophageal reflux disease without esophagitis: Secondary | ICD-10-CM | POA: Diagnosis not present

## 2023-07-09 DIAGNOSIS — K7030 Alcoholic cirrhosis of liver without ascites: Secondary | ICD-10-CM | POA: Diagnosis not present

## 2023-07-09 DIAGNOSIS — F10230 Alcohol dependence with withdrawal, uncomplicated: Secondary | ICD-10-CM | POA: Diagnosis not present

## 2023-07-09 DIAGNOSIS — G40209 Localization-related (focal) (partial) symptomatic epilepsy and epileptic syndromes with complex partial seizures, not intractable, without status epilepticus: Secondary | ICD-10-CM | POA: Diagnosis not present

## 2023-07-09 DIAGNOSIS — F1020 Alcohol dependence, uncomplicated: Secondary | ICD-10-CM | POA: Diagnosis not present

## 2023-07-09 DIAGNOSIS — F419 Anxiety disorder, unspecified: Secondary | ICD-10-CM | POA: Diagnosis not present

## 2023-07-09 DIAGNOSIS — J9601 Acute respiratory failure with hypoxia: Secondary | ICD-10-CM | POA: Diagnosis not present

## 2023-07-14 DIAGNOSIS — K7030 Alcoholic cirrhosis of liver without ascites: Secondary | ICD-10-CM | POA: Diagnosis not present

## 2023-07-14 DIAGNOSIS — E039 Hypothyroidism, unspecified: Secondary | ICD-10-CM | POA: Diagnosis not present

## 2023-07-14 DIAGNOSIS — F32A Depression, unspecified: Secondary | ICD-10-CM | POA: Diagnosis not present

## 2023-07-14 DIAGNOSIS — K219 Gastro-esophageal reflux disease without esophagitis: Secondary | ICD-10-CM | POA: Diagnosis not present

## 2023-07-14 DIAGNOSIS — G40209 Localization-related (focal) (partial) symptomatic epilepsy and epileptic syndromes with complex partial seizures, not intractable, without status epilepticus: Secondary | ICD-10-CM | POA: Diagnosis not present

## 2023-07-14 DIAGNOSIS — F1020 Alcohol dependence, uncomplicated: Secondary | ICD-10-CM | POA: Diagnosis not present

## 2023-07-14 DIAGNOSIS — F10230 Alcohol dependence with withdrawal, uncomplicated: Secondary | ICD-10-CM | POA: Diagnosis not present

## 2023-07-14 DIAGNOSIS — F419 Anxiety disorder, unspecified: Secondary | ICD-10-CM | POA: Diagnosis not present

## 2023-07-14 DIAGNOSIS — J9601 Acute respiratory failure with hypoxia: Secondary | ICD-10-CM | POA: Diagnosis not present

## 2023-07-15 ENCOUNTER — Ambulatory Visit (HOSPITAL_BASED_OUTPATIENT_CLINIC_OR_DEPARTMENT_OTHER): Payer: Medicare HMO | Admitting: Family Medicine

## 2023-07-17 ENCOUNTER — Telehealth: Payer: Self-pay | Admitting: Neurology

## 2023-07-17 DIAGNOSIS — K219 Gastro-esophageal reflux disease without esophagitis: Secondary | ICD-10-CM | POA: Diagnosis not present

## 2023-07-17 DIAGNOSIS — F32A Depression, unspecified: Secondary | ICD-10-CM | POA: Diagnosis not present

## 2023-07-17 DIAGNOSIS — F10230 Alcohol dependence with withdrawal, uncomplicated: Secondary | ICD-10-CM | POA: Diagnosis not present

## 2023-07-17 DIAGNOSIS — G40209 Localization-related (focal) (partial) symptomatic epilepsy and epileptic syndromes with complex partial seizures, not intractable, without status epilepticus: Secondary | ICD-10-CM | POA: Diagnosis not present

## 2023-07-17 DIAGNOSIS — J9601 Acute respiratory failure with hypoxia: Secondary | ICD-10-CM | POA: Diagnosis not present

## 2023-07-17 DIAGNOSIS — F1020 Alcohol dependence, uncomplicated: Secondary | ICD-10-CM | POA: Diagnosis not present

## 2023-07-17 DIAGNOSIS — F419 Anxiety disorder, unspecified: Secondary | ICD-10-CM | POA: Diagnosis not present

## 2023-07-17 DIAGNOSIS — E039 Hypothyroidism, unspecified: Secondary | ICD-10-CM | POA: Diagnosis not present

## 2023-07-17 DIAGNOSIS — K7030 Alcoholic cirrhosis of liver without ascites: Secondary | ICD-10-CM | POA: Diagnosis not present

## 2023-07-17 NOTE — Telephone Encounter (Signed)
Left message with the after hour service on 07-17-23 at 12:27 pm   Caller states patient is discharged from home health next week due to goal met

## 2023-07-21 ENCOUNTER — Ambulatory Visit: Payer: Self-pay

## 2023-07-21 ENCOUNTER — Institutional Professional Consult (permissible substitution): Payer: Medicare HMO | Admitting: Psychology

## 2023-07-22 ENCOUNTER — Ambulatory Visit (HOSPITAL_BASED_OUTPATIENT_CLINIC_OR_DEPARTMENT_OTHER): Payer: Medicare HMO | Admitting: Family Medicine

## 2023-07-22 DIAGNOSIS — F32A Depression, unspecified: Secondary | ICD-10-CM | POA: Diagnosis not present

## 2023-07-22 DIAGNOSIS — E039 Hypothyroidism, unspecified: Secondary | ICD-10-CM | POA: Diagnosis not present

## 2023-07-22 DIAGNOSIS — F419 Anxiety disorder, unspecified: Secondary | ICD-10-CM | POA: Diagnosis not present

## 2023-07-22 DIAGNOSIS — K7030 Alcoholic cirrhosis of liver without ascites: Secondary | ICD-10-CM | POA: Diagnosis not present

## 2023-07-22 DIAGNOSIS — K219 Gastro-esophageal reflux disease without esophagitis: Secondary | ICD-10-CM | POA: Diagnosis not present

## 2023-07-22 DIAGNOSIS — J9601 Acute respiratory failure with hypoxia: Secondary | ICD-10-CM | POA: Diagnosis not present

## 2023-07-22 DIAGNOSIS — G40209 Localization-related (focal) (partial) symptomatic epilepsy and epileptic syndromes with complex partial seizures, not intractable, without status epilepticus: Secondary | ICD-10-CM | POA: Diagnosis not present

## 2023-07-22 DIAGNOSIS — F1020 Alcohol dependence, uncomplicated: Secondary | ICD-10-CM | POA: Diagnosis not present

## 2023-07-22 DIAGNOSIS — F10230 Alcohol dependence with withdrawal, uncomplicated: Secondary | ICD-10-CM | POA: Diagnosis not present

## 2023-07-30 ENCOUNTER — Encounter: Payer: Medicare HMO | Admitting: Psychology

## 2024-01-13 ENCOUNTER — Other Ambulatory Visit: Payer: Self-pay | Admitting: Internal Medicine

## 2024-01-13 DIAGNOSIS — B182 Chronic viral hepatitis C: Secondary | ICD-10-CM

## 2024-01-13 DIAGNOSIS — K7030 Alcoholic cirrhosis of liver without ascites: Secondary | ICD-10-CM

## 2024-01-22 ENCOUNTER — Other Ambulatory Visit

## 2024-02-12 ENCOUNTER — Inpatient Hospital Stay: Admission: RE | Admit: 2024-02-12 | Source: Ambulatory Visit

## 2024-08-16 ENCOUNTER — Other Ambulatory Visit: Payer: Self-pay | Admitting: Nurse Practitioner

## 2024-08-16 DIAGNOSIS — F1090 Alcohol use, unspecified, uncomplicated: Secondary | ICD-10-CM

## 2024-08-16 DIAGNOSIS — K7030 Alcoholic cirrhosis of liver without ascites: Secondary | ICD-10-CM

## 2024-08-30 ENCOUNTER — Inpatient Hospital Stay: Admission: RE | Admit: 2024-08-30 | Source: Ambulatory Visit
